# Patient Record
Sex: Male | Born: 1935 | Race: Asian | Hispanic: No | Marital: Married | Smoking: Never smoker
Health system: Southern US, Community
[De-identification: ages and names within clinical notes are randomized; demographics above are authoritative.]

## PROBLEM LIST (undated history)

## (undated) DIAGNOSIS — F32A Depression, unspecified: Secondary | ICD-10-CM

## (undated) DIAGNOSIS — K469 Unspecified abdominal hernia without obstruction or gangrene: Secondary | ICD-10-CM

## (undated) DIAGNOSIS — E785 Hyperlipidemia, unspecified: Secondary | ICD-10-CM

## (undated) DIAGNOSIS — Z951 Presence of aortocoronary bypass graft: Secondary | ICD-10-CM

## (undated) DIAGNOSIS — I340 Nonrheumatic mitral (valve) insufficiency: Secondary | ICD-10-CM

## (undated) DIAGNOSIS — E039 Hypothyroidism, unspecified: Secondary | ICD-10-CM

## (undated) DIAGNOSIS — F329 Major depressive disorder, single episode, unspecified: Secondary | ICD-10-CM

## (undated) DIAGNOSIS — I639 Cerebral infarction, unspecified: Secondary | ICD-10-CM

## (undated) DIAGNOSIS — R011 Cardiac murmur, unspecified: Secondary | ICD-10-CM

## (undated) DIAGNOSIS — J189 Pneumonia, unspecified organism: Secondary | ICD-10-CM

## (undated) DIAGNOSIS — I10 Essential (primary) hypertension: Secondary | ICD-10-CM

## (undated) DIAGNOSIS — Z87438 Personal history of other diseases of male genital organs: Secondary | ICD-10-CM

## (undated) DIAGNOSIS — E079 Disorder of thyroid, unspecified: Secondary | ICD-10-CM

## (undated) DIAGNOSIS — K219 Gastro-esophageal reflux disease without esophagitis: Secondary | ICD-10-CM

## (undated) DIAGNOSIS — G4730 Sleep apnea, unspecified: Secondary | ICD-10-CM

## (undated) HISTORY — DX: Cardiac murmur, unspecified: R01.1

## (undated) HISTORY — DX: Unspecified abdominal hernia without obstruction or gangrene: K46.9

## (undated) HISTORY — DX: Presence of aortocoronary bypass graft: Z95.1

## (undated) HISTORY — DX: Essential (primary) hypertension: I10

## (undated) HISTORY — DX: Cerebral infarction, unspecified: I63.9

## (undated) HISTORY — DX: Depression, unspecified: F32.A

## (undated) HISTORY — PX: CATARACT EXTRACTION: SUR2

## (undated) HISTORY — DX: Gastro-esophageal reflux disease without esophagitis: K21.9

## (undated) HISTORY — PX: TURP VAPORIZATION: SUR1397

## (undated) HISTORY — DX: Nonrheumatic mitral (valve) insufficiency: I34.0

## (undated) HISTORY — DX: Disorder of thyroid, unspecified: E07.9

## (undated) HISTORY — PX: CORONARY ARTERY BYPASS GRAFT: SHX141

## (undated) HISTORY — DX: Hyperlipidemia, unspecified: E78.5

## (undated) HISTORY — DX: Major depressive disorder, single episode, unspecified: F32.9

## (undated) HISTORY — PX: CHOLECYSTECTOMY: SHX55

## (undated) HISTORY — PX: OTHER SURGICAL HISTORY: SHX169

## (undated) HISTORY — PX: ERCP: SHX60

## (undated) HISTORY — DX: Personal history of other diseases of male genital organs: Z87.438

---

## 1999-08-17 ENCOUNTER — Ambulatory Visit (HOSPITAL_COMMUNITY): Admission: RE | Admit: 1999-08-17 | Discharge: 1999-08-17 | Payer: Self-pay | Admitting: Cardiology

## 1999-08-17 ENCOUNTER — Encounter: Payer: Self-pay | Admitting: Cardiology

## 1999-08-27 ENCOUNTER — Ambulatory Visit: Admission: RE | Admit: 1999-08-27 | Discharge: 1999-08-27 | Payer: Self-pay | Admitting: Cardiology

## 2008-06-14 HISTORY — PX: CORONARY ARTERY BYPASS GRAFT: SHX141

## 2008-06-14 HISTORY — PX: MITRAL VALVE REPAIR: SHX2039

## 2008-07-17 ENCOUNTER — Ambulatory Visit: Payer: Self-pay | Admitting: Cardiology

## 2008-08-01 ENCOUNTER — Ambulatory Visit: Payer: Self-pay | Admitting: Cardiology

## 2008-08-22 ENCOUNTER — Ambulatory Visit: Payer: Self-pay | Admitting: Cardiology

## 2008-08-29 ENCOUNTER — Ambulatory Visit (HOSPITAL_COMMUNITY): Admission: RE | Admit: 2008-08-29 | Discharge: 2008-08-29 | Payer: Self-pay | Admitting: Cardiology

## 2008-09-03 ENCOUNTER — Ambulatory Visit: Payer: Self-pay | Admitting: Cardiology

## 2008-09-03 LAB — CONVERTED CEMR LAB
BUN: 11 mg/dL (ref 6–23)
Basophils Absolute: 0 10*3/uL (ref 0.0–0.1)
Basophils Relative: 0 % (ref 0.0–3.0)
CO2: 30 meq/L (ref 19–32)
Calcium: 8.8 mg/dL (ref 8.4–10.5)
Chloride: 105 meq/L (ref 96–112)
Creatinine, Ser: 0.7 mg/dL (ref 0.4–1.5)
Eosinophils Absolute: 0.1 10*3/uL (ref 0.0–0.7)
Eosinophils Relative: 2 % (ref 0.0–5.0)
GFR calc non Af Amer: 118 mL/min (ref >60)
Glucose, Bld: 134 mg/dL — ABNORMAL HIGH (ref 70–99)
HCT: 37.3 % — ABNORMAL LOW (ref 39.0–52.0)
Hemoglobin: 12.7 g/dL — ABNORMAL LOW (ref 13.0–17.0)
INR: 1.1 — ABNORMAL HIGH (ref 0.8–1.0)
Lymphocytes Relative: 38.6 % (ref 12.0–46.0)
Lymphs Abs: 2.4 10*3/uL (ref 0.7–4.0)
MCHC: 34.1 g/dL (ref 30.0–36.0)
MCV: 85.2 fL (ref 78.0–100.0)
Monocytes Absolute: 0.4 10*3/uL (ref 0.1–1.0)
Monocytes Relative: 6.7 % (ref 3.0–12.0)
Neutro Abs: 3.4 10*3/uL (ref 1.4–7.7)
Neutrophils Relative %: 52.7 % (ref 43.0–77.0)
Platelets: 185 10*3/uL (ref 150.0–400.0)
Potassium: 4.3 meq/L (ref 3.5–5.1)
Prothrombin Time: 11.6 s (ref 10.9–13.3)
RBC: 4.38 M/uL (ref 4.22–5.81)
RDW: 12.3 % (ref 11.5–14.6)
Sodium: 141 meq/L (ref 135–145)
WBC: 6.3 10*3/uL (ref 4.5–10.5)
aPTT: 29 s — ABNORMAL HIGH (ref 21.7–28.8)

## 2008-09-06 ENCOUNTER — Ambulatory Visit: Payer: Self-pay | Admitting: Cardiovascular Disease

## 2008-09-06 ENCOUNTER — Inpatient Hospital Stay (HOSPITAL_BASED_OUTPATIENT_CLINIC_OR_DEPARTMENT_OTHER): Admission: RE | Admit: 2008-09-06 | Discharge: 2008-09-06 | Payer: Self-pay | Admitting: Cardiovascular Disease

## 2008-09-12 HISTORY — PX: MITRAL VALVE REPAIR: SHX2039

## 2008-09-25 ENCOUNTER — Ambulatory Visit: Payer: Self-pay | Admitting: Cardiology

## 2008-11-06 ENCOUNTER — Ambulatory Visit: Payer: Self-pay | Admitting: Cardiology

## 2008-11-07 ENCOUNTER — Encounter: Payer: Self-pay | Admitting: Cardiology

## 2008-11-20 ENCOUNTER — Ambulatory Visit: Payer: Self-pay | Admitting: Cardiology

## 2008-11-20 ENCOUNTER — Telehealth: Payer: Self-pay | Admitting: Cardiology

## 2008-11-20 LAB — CONVERTED CEMR LAB
Basophils Absolute: 0.1 10*3/uL (ref 0.0–0.1)
Bilirubin Urine: NEGATIVE
HCT: 34.5 % — ABNORMAL LOW (ref 39.0–52.0)
Hemoglobin: 12 g/dL — ABNORMAL LOW (ref 13.0–17.0)
Ketones, ur: NEGATIVE mg/dL
Leukocytes, UA: NEGATIVE
Lymphs Abs: 1.7 10*3/uL (ref 0.7–4.0)
MCV: 81.1 fL (ref 78.0–100.0)
Monocytes Relative: 5.1 % (ref 3.0–12.0)
Neutro Abs: 11 10*3/uL — ABNORMAL HIGH (ref 1.4–7.7)
RDW: 12.6 % (ref 11.5–14.6)
Total Protein, Urine: NEGATIVE mg/dL
pH: 6 (ref 5.0–8.0)

## 2008-11-22 ENCOUNTER — Ambulatory Visit: Payer: Self-pay | Admitting: Cardiology

## 2008-11-27 ENCOUNTER — Telehealth: Payer: Self-pay | Admitting: Cardiology

## 2008-12-05 ENCOUNTER — Encounter: Payer: Self-pay | Admitting: Cardiology

## 2008-12-05 ENCOUNTER — Encounter (HOSPITAL_COMMUNITY): Admission: RE | Admit: 2008-12-05 | Discharge: 2009-03-05 | Payer: Self-pay | Admitting: Cardiology

## 2009-03-05 ENCOUNTER — Encounter: Payer: Self-pay | Admitting: Cardiology

## 2009-03-06 ENCOUNTER — Encounter (HOSPITAL_COMMUNITY): Admission: RE | Admit: 2009-03-06 | Discharge: 2009-05-13 | Payer: Self-pay | Admitting: Cardiology

## 2009-03-14 ENCOUNTER — Encounter: Payer: Self-pay | Admitting: Cardiology

## 2009-03-14 DIAGNOSIS — I639 Cerebral infarction, unspecified: Secondary | ICD-10-CM

## 2009-03-14 HISTORY — DX: Cerebral infarction, unspecified: I63.9

## 2009-03-17 ENCOUNTER — Encounter: Payer: Self-pay | Admitting: Cardiology

## 2009-03-21 ENCOUNTER — Encounter: Payer: Self-pay | Admitting: Cardiology

## 2009-03-31 ENCOUNTER — Encounter: Payer: Self-pay | Admitting: Cardiology

## 2009-04-04 ENCOUNTER — Ambulatory Visit: Payer: Self-pay | Admitting: Vascular Surgery

## 2009-04-09 ENCOUNTER — Ambulatory Visit: Payer: Self-pay | Admitting: Vascular Surgery

## 2009-04-09 ENCOUNTER — Encounter: Admission: RE | Admit: 2009-04-09 | Discharge: 2009-04-09 | Payer: Self-pay | Admitting: Vascular Surgery

## 2009-04-14 HISTORY — PX: CAROTID ENDARTERECTOMY: SUR193

## 2009-04-16 ENCOUNTER — Encounter (INDEPENDENT_AMBULATORY_CARE_PROVIDER_SITE_OTHER): Payer: Self-pay | Admitting: *Deleted

## 2009-04-16 ENCOUNTER — Ambulatory Visit: Payer: Self-pay | Admitting: Cardiology

## 2009-04-16 DIAGNOSIS — I6350 Cerebral infarction due to unspecified occlusion or stenosis of unspecified cerebral artery: Secondary | ICD-10-CM | POA: Insufficient documentation

## 2009-04-16 DIAGNOSIS — I6529 Occlusion and stenosis of unspecified carotid artery: Secondary | ICD-10-CM | POA: Insufficient documentation

## 2009-04-16 DIAGNOSIS — F329 Major depressive disorder, single episode, unspecified: Secondary | ICD-10-CM

## 2009-04-16 DIAGNOSIS — R079 Chest pain, unspecified: Secondary | ICD-10-CM | POA: Insufficient documentation

## 2009-04-16 DIAGNOSIS — I059 Rheumatic mitral valve disease, unspecified: Secondary | ICD-10-CM | POA: Insufficient documentation

## 2009-04-16 DIAGNOSIS — I2510 Atherosclerotic heart disease of native coronary artery without angina pectoris: Secondary | ICD-10-CM | POA: Insufficient documentation

## 2009-04-16 DIAGNOSIS — F3289 Other specified depressive episodes: Secondary | ICD-10-CM | POA: Insufficient documentation

## 2009-04-22 ENCOUNTER — Ambulatory Visit: Payer: Self-pay | Admitting: Vascular Surgery

## 2009-04-22 ENCOUNTER — Inpatient Hospital Stay (HOSPITAL_COMMUNITY): Admission: RE | Admit: 2009-04-22 | Discharge: 2009-04-23 | Payer: Self-pay | Admitting: Vascular Surgery

## 2009-04-22 ENCOUNTER — Encounter: Payer: Self-pay | Admitting: Vascular Surgery

## 2009-04-23 ENCOUNTER — Encounter: Payer: Self-pay | Admitting: Cardiology

## 2009-04-23 ENCOUNTER — Encounter: Payer: Self-pay | Admitting: Vascular Surgery

## 2009-05-16 ENCOUNTER — Ambulatory Visit: Payer: Self-pay | Admitting: Vascular Surgery

## 2009-05-19 ENCOUNTER — Encounter (INDEPENDENT_AMBULATORY_CARE_PROVIDER_SITE_OTHER): Payer: Self-pay | Admitting: *Deleted

## 2009-05-28 ENCOUNTER — Encounter: Payer: Self-pay | Admitting: Cardiology

## 2009-06-02 ENCOUNTER — Encounter: Payer: Self-pay | Admitting: Cardiology

## 2009-06-09 ENCOUNTER — Ambulatory Visit: Payer: Self-pay | Admitting: Vascular Surgery

## 2009-06-17 ENCOUNTER — Ambulatory Visit: Payer: Self-pay | Admitting: Cardiology

## 2009-06-17 DIAGNOSIS — R5381 Other malaise: Secondary | ICD-10-CM | POA: Insufficient documentation

## 2009-06-17 DIAGNOSIS — R1312 Dysphagia, oropharyngeal phase: Secondary | ICD-10-CM | POA: Insufficient documentation

## 2009-06-17 DIAGNOSIS — R5383 Other fatigue: Secondary | ICD-10-CM

## 2009-06-30 ENCOUNTER — Encounter: Payer: Self-pay | Admitting: Cardiology

## 2009-09-04 ENCOUNTER — Encounter: Payer: Self-pay | Admitting: Cardiology

## 2009-10-07 ENCOUNTER — Encounter: Payer: Self-pay | Admitting: Cardiology

## 2009-10-08 ENCOUNTER — Encounter: Payer: Self-pay | Admitting: Cardiology

## 2009-10-21 ENCOUNTER — Encounter: Payer: Self-pay | Admitting: Cardiology

## 2009-10-30 ENCOUNTER — Ambulatory Visit: Payer: Self-pay | Admitting: Cardiology

## 2009-11-28 ENCOUNTER — Ambulatory Visit: Payer: Self-pay | Admitting: Vascular Surgery

## 2009-12-25 ENCOUNTER — Telehealth (INDEPENDENT_AMBULATORY_CARE_PROVIDER_SITE_OTHER): Payer: Self-pay | Admitting: *Deleted

## 2010-06-12 ENCOUNTER — Ambulatory Visit: Payer: Self-pay | Admitting: Vascular Surgery

## 2010-07-14 NOTE — Assessment & Plan Note (Signed)
Summary: 2 mo fu -srs   Visit Type:  Follow-up Primary Provider:  Sherril Croon  CC:  follow-up visit.  History of Present Illness: the patient is a 76 year old male with a history of mitral valve disease status post mitral valve repair at [facility] in [place].  The patient did well postoperatively but then experienced a stroke with hemiplegia which has resolved for the most part.  He underwent carotid endarterectomy of the right carotid artery.  Initially had difficulty with speech and required a speech therapist.  He states that after his stroke he never regained his energy back and feels sleepy all the time.  He reports some discomfort in the right side.  He also feels that he is choking when he started to eat or drink something.  There appears to be a lack of coordination of the swallowing mechanism.  At times feels that his breathing is interrupted requiring a sigh breath because of the inability to clear his saliva.  He did now denies however any recurrent chest pain, shortness of breath orthopnea or PND.  Preventive Screening-Counseling & Management  Alcohol-Tobacco     Smoking Status: never  Current Problems (verified): 1)  Chest Pain  (ICD-786.50) 2)  Depression  (ICD-311) 3)  Left Ventricular Function, Decreased  (ICD-429.2) 4)  Coronary Artery Bypass Graft, Hx of  (ICD-V45.81) 5)  Mitral Valve Disorder  (ICD-424.0) 6)  Carotid Artery Disease  (ICD-433.10) 7)  Cerebrovascular Accident  (ICD-434.91)  Current Medications (verified): 1)  Levothroid 50 Mcg Tabs (Levothyroxine Sodium) .... One Tablet Daily 2)  Metoprolol Tartrate 50 Mg Tabs (Metoprolol Tartrate) .... 1/2 Tablet By Mouth Twice A Day 3)  Aspir-Trin 325 Mg Tbec (Aspirin) .... Take 1 Tablet By Mouth Once A Day 4)  Vitamin B-12 500 Mcg Tabs (Cyanocobalamin) .... Take 1 Tablet By Mouth Once A Day 5)  Multivitamins  Tabs (Multiple Vitamin) .... Take 1 Tablet By Mouth Once A Day 6)  Crestor 10 Mg Tabs  (Rosuvastatin Calcium) .... Take 1 Tablet By Mouth Once A Day  Allergies (verified): No Known Drug Allergies  Past History:  Past Medical History: Last updated: 04/16/2009 CVA October 2010. 1. Acute pharyngitis and possibly bronchitis. 2. Myalgias and fever, likely secondary to acute pharyngitis and     possibly bronchitis. 3. Ruled out for endocarditis with negative blood cultures. 4. Status post mitral valve repair and single-vessel coronary artery     bypass grafting. 5. No residual mitral regurgitation. 6. Mild left ventricular dysfunction, ejection fraction of 49%. 7. History of hypertension, stable. 8. History of melanoma removal.   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 This shows a moderate 60% stenosis in his right internal carotid artery. I am concerned that he doesn't extremely irregular and builtup-like in this area. I have recommended right carotid endarterectomy for reduction of syndrome. I explained that with a moderate stenosis, active) symptoms and very irregular plaque I felt it was most her pretreatment. The procedure was described in great detail the potential risk for perioperative stroke at  1-2% risk for cranial nerve injury at 1-2% as well. The patient will proceed with surgery on 11 9 at [facility].  Review of Systems       The patient complains of fatigue.  The patient denies malaise, fever, weight gain/loss, vision loss, decreased hearing,  hoarseness, chest pain, palpitations, shortness of breath, prolonged cough, wheezing, sleep apnea, coughing up blood, abdominal pain, blood in stool, nausea, vomiting, diarrhea, heartburn, incontinence, blood in urine, muscle weakness, joint pain, leg swelling, rash, skin lesions, headache, fainting, dizziness, depression, anxiety, enlarged lymph nodes, easy bruising or bleeding, and environmental allergies.    Vital Signs:  Patient profile:   76 year old male Height:      66 inches Weight:      138 pounds Pulse rate:   50 / minute BP sitting:   129 / 63  (left arm) Cuff size:   regular  Vitals Entered By: Carlye Grippe (June 17, 2009 3:35 PM) CC: follow-up visit   Physical Exam  Additional Exam:  General: Well-developed, well-nourished in no distress head: Normocephalic and atraumatic eyes PERRLA/EOMI intact, conjunctiva and lids normal nose: No deformity or lesions mouth normal dentition, normal posterior pharynx neck: Supple, no JVD.  No masses, thyromegaly or abnormal cervical nodes lungs: Normal breath sounds bilaterally without wheezing.  Normal percussion heart: Rectal rate and rhythm with normal S1 and S2, no S3 or S4.  PMI is normal.  No pathological murmurs  abdomen: Normal bowel sounds, abdomen is soft and nontender without masses, organomegaly or hernias noted.  No hepatosplenomegaly musculoskeletal: Back normal, normal gait muscle strength and tone normal pulsus: Pulse is normal in all 4 extremities Extremities: No peripheral pitting edema neurologic: Alert and oriented x 3 skin: Intact without lesions or rashes cervical nodes: No significant adenopathy psychologic: Normal affect    Impression & Recommendations:  Problem # 1:  MITRAL VALVE DISORDER (ICD-424.0) the patient is status-post successful mitral valve repair.  He has no recurrent murmur. The following medications were removed from the medication list:    Lisinopril 10 Mg Tabs (Lisinopril) .Marland Kitchen... Take  1 tablet by mouth once a day His updated medication list for this problem includes:    Metoprolol Tartrate 50 Mg Tabs (Metoprolol tartrate) .Marland Kitchen... 1/2 tablet by mouth twice a day  Orders: T-TSH ([id number])  Problem # 2:  CORONARY ARTERY BYPASS GRAFT, HX OF (ICD-V45.81) patient underwent bypass grafting at the time of his mitral valve repair.  He has no recurrent chest pain.  There is no evidence of ischemia.  Problem # 3:  CEREBROVASCULAR ACCIDENT (ICD-434.91) the patient is status post right carotid endarterectomy.ithe reports no complications. His updated medication list for this problem includes:    Aspir-trin 325 Mg Tbec (Aspirin) .Marland Kitchen... Take 1 tablet by mouth once a day  Orders: T-TSH ([id number])  Problem # 4:  DYSPHAGIA OROPHARYNGEAL PHASE (GEX-528.41) we will refer to patient to Dr. Andrey Campanile ENT for evaluation of dysphagia and difficulty handling his saliva.  This is likely residual from his prior stroke.   Problem # 5:  FATIGUE (ICD-780.79) there is no clear hemodynamic reason why the patient's fatigue.  He does have hypothyroidism and we will check a TSH.  I also told him and his son to taper down his beta-blocker and ultimately to discontinue it.  It's possible beta-blockers contributed to his fatigue.  Patient Instructions: 1)  Stop beta blocker per instructions discussed with Dr. Andee Lineman. 2)  Labs:  TSH 3)  Referral to Dr. Andrey Campanile (  ENT) for dysphagia after stroke 4)  Follow up in 6 months.

## 2010-07-14 NOTE — Letter (Signed)
Summary: [facility]- OFFICE NOTE  E[facility]- OFFICE NOTE   Imported By: Claudette Laws 10/24/2009 08:35:13  _____________________________________________________________________  External Attachment:    Type:   Image     Comment:   External Document

## 2010-07-14 NOTE — Progress Notes (Signed)
Summary: V V OFFICE VISIT   V V OFFICE VISIT   Imported By: Zachary George 06/17/2009 13:57:50  _____________________________________________________________________  External Attachment:    Type:   Image     Comment:   External Document

## 2010-07-14 NOTE — Progress Notes (Signed)
Summary: V V OFFICE VISIT  V V OFFICE VISIT   Imported By: Zachary George 06/17/2009 14:01:06  _____________________________________________________________________  External Attachment:    Type:   Image     Comment:   External Document

## 2010-07-14 NOTE — Letter (Signed)
Summary: [place] D/C SUMMARY  [place] D/C SUMMARY   Imported By: Zachary George 06/17/2009 13:59:28  _____________________________________________________________________  External Attachment:    Type:   Image     Comment:   External Document

## 2010-07-14 NOTE — Letter (Signed)
Summary: [facility]-OFFICE NOTE  E[facility]-OFFICE NOTE   Imported By: Claudette Laws 10/24/2009 08:34:38  _____________________________________________________________________  External Attachment:    Type:   Image     Comment:   External Document

## 2010-07-14 NOTE — Letter (Signed)
Summary: TSH  TSH   Imported By: Cyril Loosen, RN, BSN 07/25/2009 16:38:02  _____________________________________________________________________  External Attachment:    Type:   Image     Comment:   External Document

## 2010-07-14 NOTE — Progress Notes (Signed)
Summary: e-mail sent to MD  Phone Note Other Incoming   Summary of Call: E-MAIL SENT TO DR. Andee Lineman PHONE:    Sent from my iPhone  Begin forwarded message:   From: [email or web address] Date: December 25, 2009 4:21:11 PM EDT To: Peyton [email or web address]> Subject: Thanks for recommending "UNSTUCK"       Thursday, December 25, 2009   Dear Dr Andee Lineman,   Dad asked me to email you.   He is grateful to you for everything you have done for him.  He is continuing to make small progress in a positive direction.  He is thoroughly enjoying the book "UNSTUCK".  He especially loves the chapter on spirituality.  The book's spiritual message embodies dad's lifelong philosophy.  He was so happy to read those principles written in such an important book.  The discussion on depression are also of great help to dad during his personal fight with depression.   Because of the book, dad has gotten emotionally better and stronger much faster.  Furthemorer, he continues to read passages in the book on a daily basis and continues to draw strength from it.   Each time he picks up the book, he thinks of you.  He always tells me that you have given him such great care and great advice.    Please accept our sincere gratitude for your love and care.  We are so lucky you are Dad's physician.     If there is anything we can ever do for you, please let us know.  It would be our pleasure to assist you in any way we can.   With Warmest Regards,   Tawni Pummel for Dad [facility] [place])

## 2010-07-14 NOTE — Medication Information (Signed)
Summary: Tax adviser   Imported By: Claudette Laws 10/24/2009 08:35:56  _____________________________________________________________________  External Attachment:    Type:   Image     Comment:   External Document

## 2010-07-14 NOTE — Assessment & Plan Note (Signed)
Summary: ROV-  WEAKNESS PAST MONTH   Visit Type:  weakness Primary Provider:  Vyas   History of Present Illness: the patient is a pleasant 76 year old male status-post mitral valve repair at [facility] in [place].  Initially did well postoperatively but then experienced a stroke in the plegia which has resolved for the most part.  He underwent carotid endarterectomy of the right carotid artery.  The patient main complaints now having lack of energy weakness and fatigue.  His symptoms have not improved over the last several months.  He feels tired all the time.  An extensive laboratory work showed no significant abnormality.  Several months ago in the last office visit I was concerned the patient was started to exhibit signs of depression.  He has now been started by his primary care physician on an antidepressant and is having clear signs of depression.  The patient is under a lot of social stress as it appears that his housemaid come under foreclosure.  He and his son present again today to make sure that there is no underlying cardiac issues.  The patient has no chest pain or shortness of breath.  His mitral valve repair appears to be durable.  He has no acute EKG changes.  Of note is that the patient had no vessel coronary bypass grafting at the time of his mitral valve surgery.   Preventive Screening-Counseling & Management  Alcohol-Tobacco     Smoking Status: never  Current Problems (verified): 1)  Fatigue  (ICD-780.79) 2)  Dysphagia Oropharyngeal Phase  (ICD-787.22) 3)  Chest Pain  (ICD-786.50) 4)  Depression  (ICD-311) 5)  Left Ventricular Function, Decreased  (ICD-429.2) 6)  Coronary Artery Bypass Graft, Hx of  (ICD-V45.81) 7)  Mitral Valve Disorder  (ICD-424.0) 8)  Carotid Artery Disease  (ICD-433.10) 9)  Cerebrovascular Accident  (ICD-434.91)  Current Medications (verified): 1)  Levothroid 50 Mcg Tabs (Levothyroxine Sodium) .... One Tablet Daily 2)  Aspir-Trin  325 Mg Tbec (Aspirin) .... Take 1 Tablet By Mouth Once A Day 3)  Vitamin B-12 500 Mcg Tabs (Cyanocobalamin) .... Take 1 Tablet By Mouth Once A Day 4)  Multivitamins  Tabs (Multiple Vitamin) .... Take 1 Tablet By Mouth Once A Day 5)  Fluoxetine Hcl 20 Mg Caps (Fluoxetine Hcl) .... Take 1 Tablet By Mouth Once A Day  Allergies (verified): No Known Drug Allergies  Comments:  Nurse/Medical Assistant: The patient's medications and allergies were reviewed with the patient and were updated in the Medication and Allergy Lists.  List reviewed.  Past History:  Past Medical History: Last updated: 04/16/2009 CVA October 2010. 1. Acute pharyngitis and possibly bronchitis. 2. Myalgias and fever, likely secondary to acute pharyngitis and     possibly bronchitis. 3. Ruled out for endocarditis with negative blood cultures. 4. Status post mitral valve repair and single-vessel coronary artery     bypass grafting. 5. No residual mitral regurgitation. 6. Mild left ventricular dysfunction, ejection fraction of 49%. 7. History of hypertension, stable. 8. History of melanoma removal.   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  Alcohol Use - no  Risk Factors:  Smoking Status: never (10/30/2009)  Past Cardiac History:  [facility] Hospitalizations: 04/09/2009:  Stroke/CVA (VVTS consultation) CT Scan reviewed and discussed at length with the patient and his son. This shows a moderate 60% stenosis in his right internal carotid artery. I am concerned that he doesn't extremely irregular and builtup-like in  this area. I have recommended right carotid endarterectomy for reduction of syndrome. I explained that with a moderate stenosis, active) symptoms and very irregular plaque I felt it was most her pretreatment. The procedure was described in great detail the potential risk for perioperative stroke at 1-2% risk for cranial nerve injury at 1-2% as well. The patient will proceed with surgery on 11 9 at [facility].  Review of Systems       The patient complains of fatigue, shortness of breath, and depression.  The patient denies malaise, fever, weight gain/loss, vision loss, decreased hearing, hoarseness, chest pain, palpitations, prolonged cough, wheezing, sleep apnea, coughing up blood, abdominal pain, blood in stool, nausea, vomiting, diarrhea, heartburn, incontinence, blood in urine, muscle weakness, joint pain, leg swelling, rash, skin lesions, headache, fainting, dizziness, anxiety, enlarged lymph nodes, easy bruising or bleeding, and environmental allergies.    Vital Signs:  Patient profile:   76 year old male Height:      66 inches Weight:      137 pounds O2 Sat:      99 % Pulse rate:   65 / minute BP sitting:   136 / 72  (left arm) Cuff size:   regular  Vitals Entered By: Carlye Grippe (Oct 30, 2009 3:35 PM) Comments fatigue   Physical Exam  Additional Exam:  General: Well-developed, well-nourished in no distress head: Normocephalic and atraumatic eyes PERRLA/EOMI intact, conjunctiva and lids normal nose: No deformity or lesions mouth normal dentition, normal posterior pharynx neck: Supple, no JVD.  No masses, thyromegaly or abnormal cervical nodes lungs: Normal breath sounds bilaterally without wheezing.  Normal percussion heart: Rectal rate and rhythm with normal S1 and S2, no S3 or S4.  PMI is normal.  No pathological murmurs  abdomen: Normal bowel sounds, abdomen is soft and nontender without masses, organomegaly or hernias noted.  No  hepatosplenomegaly musculoskeletal: Back normal, normal gait muscle strength and tone normal pulsus: Pulse is normal in all 4 extremities Extremities: No peripheral pitting edema neurologic: Alert and oriented x 3 skin: Intact without lesions or rashes cervical nodes: No significant adenopathy psychologic: Normal affect    EKG  Procedure date:  10/30/2009  Findings:      normal sinus rhythm.  Right bundle branch block.  Inferior infarct pattern.  Heart rate 60 beats/min  Impression & Recommendations:  Problem # 1:  FATIGUE (ICD-780.79) there is no clear definite cardiac cause for the patient's fatigue.  I suspect this is related to his underlying depression.  Problem # 2:  LEFT VENTRICULAR FUNCTION, DECREASED (ICD-429.2) the patient has single-vessel coronary bypass grafting.  His ejection fraction was mildly decreased at 49%.  We will obtain an echocardiogram in 3 months.EKG also shows a new right bundle branch block.  There are inferior Q waves but he appeared to be old.  I did not discuss this with the patient and his family in the office and I reviewed this afterwards.  We will have the patient obtain an echocardiogram earlier and if his ejection fraction is furtherb decreased he will need a stress test.  Problem # 3:  CORONARY ARTERY BYPASS GRAFT, HX OF (ICD-V45.81) Assessment: Comment Only  Orders:  EKG w/ Interpretation (93000)  Problem # 4:  CAROTID ARTERY DISEASE (ICD-433.10) the patient has recovered well from his CVA.  Status post uncomplicated carotid endarterectomy.  Continuous factor modifications His updated medication list for this problem includes:    Aspir-trin 325 Mg Tbec (Aspirin) .Marland Kitchen... Take 1 tablet by mouth once a day  Problem # 5:  MITRAL VALVE DISORDER (ICD-424.0) the patient status-post mitral valve repair physical examinationshows no evidence of mitral valve dysfunction. The following medications were removed from the medication list:    Metoprolol  Tartrate 50 Mg Tabs (Metoprolol tartrate) .Marland Kitchen... 1/2 tablet by mouth twice a day  Patient Instructions: 1)  Your physician recommends that you continue on your current medications as directed. Please refer to the Current Medication list given to you today. 2)  Your physician wants you to follow-up in: 6 months. You will receive a reminder letter in the mail about two months in advance. If you don't receive a letter, please call our office to schedule the follow-up appointment.

## 2010-07-14 NOTE — Miscellaneous (Signed)
Summary: Orders Update - ENT REFERRAL  Clinical Lists Changes  Orders: Added new Referral order of ENT Referral (ENT) - Signed

## 2010-07-14 NOTE — Miscellaneous (Signed)
Summary: Rehab Report/ [facility]  Rehab Report/ [facility]   Imported By: Dorise Hiss 09/04/2009 14:09:06  _____________________________________________________________________  External Attachment:    Type:   Image     Comment:   External Document

## 2010-07-14 NOTE — Op Note (Signed)
Summary: Operative Report  Operative Report   Imported By: Zachary George 06/17/2009 13:59:00  _____________________________________________________________________  External Attachment:    Type:   Image     Comment:   External Document

## 2010-09-16 LAB — BASIC METABOLIC PANEL
Calcium: 8.7 mg/dL (ref 8.4–10.5)
Creatinine, Ser: 0.86 mg/dL (ref 0.4–1.5)
GFR calc non Af Amer: >60 mL/min (ref >60)
Glucose, Bld: 130 mg/dL — ABNORMAL HIGH (ref 70–99)
Sodium: 136 mEq/L (ref 135–145)

## 2010-09-16 LAB — TYPE AND SCREEN: ABO/RH(D): B POS

## 2010-09-16 LAB — URINALYSIS, ROUTINE W REFLEX MICROSCOPIC
Bilirubin Urine: NEGATIVE
Glucose, UA: NEGATIVE mg/dL
Hgb urine dipstick: NEGATIVE
Protein, ur: NEGATIVE mg/dL

## 2010-09-16 LAB — ABO/RH: ABO/RH(D): B POS

## 2010-09-16 LAB — CBC
Hemoglobin: 11.7 g/dL — ABNORMAL LOW (ref 13.0–17.0)
MCHC: 34.3 g/dL (ref 30.0–36.0)
Platelets: 131 10*3/uL — ABNORMAL LOW (ref 150–400)
RDW: 13.2 % (ref 11.5–15.5)

## 2010-09-24 LAB — POCT I-STAT 3, VENOUS BLOOD GAS (G3P V)
pCO2, Ven: 46.2 mmHg (ref 45.0–50.0)
pH, Ven: 7.301 — ABNORMAL HIGH (ref 7.250–7.300)
pO2, Ven: 37 mmHg (ref 30.0–45.0)

## 2010-09-24 LAB — POCT I-STAT 3, ART BLOOD GAS (G3+)
Bicarbonate: 24.2 mEq/L — ABNORMAL HIGH (ref 20.0–24.0)
TCO2: 26 mmol/L (ref 0–100)
pCO2 arterial: 44.2 mmHg (ref 35.0–45.0)
pH, Arterial: 7.347 — ABNORMAL LOW (ref 7.350–7.450)
pO2, Arterial: 82 mmHg (ref 80.0–100.0)

## 2010-10-27 NOTE — Assessment & Plan Note (Signed)
[name]                          [facility] OFFICE NOTE   NAME:[name], [name]                     MRN:          [id number]  DATE:11/06/2008                            DOB:          [birth year]    REFERRING PHYSICIAN:  Doreen Beam, MD   HISTORY OF PRESENT ILLNESS:  The patient is a very pleasant 76 year old  male who is 33-month status post mitral valve repair, a single-vessel  coronary artery bypass grafting by Dr. Villa Herb at [name].  The patient has done very well postoperatively.  He denies  any shortness of breath or chest pain.  He does have a mild cough, which  improved after he brings up some mucus.  He has had no fever or chills,  however.  The patient did report several days after his discharge from  [name], an episode of micturition syncope.  He actually took his  blood pressure and heart rate and there were both quite low.  This was  coming off the plane when he felt he had a full bladder with urgency and  could not get to the bathroom fast enough.  However in the interim, he  has had no further problems.  He is not on Lasix anymore at the present  time.  A chest x-ray was obtained postoperatively and it appears normal.  His EKG also demonstrates normal sinus rhythm with small Q-waves in  inferior leads, but unchanged from his prior tracing.   MEDICATIONS:  1. Levothyroxine 50 mcg.  2. Crestor 10 mg p.o. daily.  3. Vitamin B12 mg 500 mcg p.o. daily.  4. Multivitamin.  5. Aspirin 325 daily.  6. Metoprolol 25 mg p.o. daily   PHYSICAL EXAMINATION:  VITAL SIGNS:  Blood pressure 113/72, heart rate  92, and weight 130 pounds.  GENERAL:  Well nourished [place] male, but in no apparent distress.  HEENT:  Pupils; eyes are clear.  Conjunctivae clear.  NECK:  Supple.  Normal carotid upstroke.  No carotid bruits.  LUNGS:  Clear breath sounds bilaterally.  HEART:  Regular rate and rhythm with normal S1 and S2.  No  murmur, rubs,  or gallops.  ABDOMEN:  Soft and nontender.  EXTREMITIES:  No cyanosis, clubbing, or edema.  NEUROLOGIC:  The patient is alert, oriented and grossly nonfocal.   PROBLEM LIST:  1. Status post mitral valve repair and single-vessel coronary artery      bypass grafting.  2. No residual mitral regurgitation.  3. Mild left ventricular dysfunction, ejection fraction 49%.  4. Hypertension, stable.  5. History of melanoma removal.   PLAN:  1. We reviewed the patient's postoperative chest x-ray, is within      normal limits.  2. The patient appears to be somewhat dehydrated and therefore I      suspected elevated heart rate.  We asked him to liberalize his salt      intake and that take some additional Gatorade.  We will also await      a week before starting him on an ACE inhibitor for his left  ventricular dysfunction.  3. In about 3 months, we will obtain a followup of echocardiogram      mainly to reassess his left ventricular function.  There is no      residual mitral regurgitation postoperatively.  The patient will      also be enrolled in cardiac rehab.     Learta Codding, MD,FACC  Electronically Signed    GED/MedQ  DD: 11/06/2008  DT: 11/07/2008  Job #: [id number]   cc:   Doreen Beam, MD  Marita Kansas, MD

## 2010-10-27 NOTE — Assessment & Plan Note (Signed)
[facility]                          [facility] OFFICE NOTE   NAME:[name], [name]                     MRN:          [id number]  DATE:08/01/2008                            DOB:          1934/09/29    HISTORY OF PRESENT ILLNESS:  The patient is a very pleasant 76 year old  [place] male who was recently admitted with abdominal pain.  The patient  appeared to have acute cholecystitis with gallstones and a biliary  obstruction due to common bile duct stone.  First an ERCP was done.  It  was noted that he had a pyloric channel that required dilatation also  the patient had a stone in distal and the common bile duct which could  not be cannulated.  Sphincterotomy was done and stent was placed by Dr.  Loreta Ave.  Initially laparoscopic cholecystectomy was done, but the patient  continued to have pain and nausea and therefore they had to do the ERCP  because of a retained common bile duct stone.   The patient, however, recovered well.  During this hospitalization he  was found to have a significant murmur and also was noted to have mitral  valve prolapse with mitral regurgitation first diagnosed in 1981 at [facility].  The patient does not have a history of rheumatic fever.  The  echo showed mitral valve prolapse with an ejection fraction 40% to 45%  and moderate-to-severe mitral regurgitation.  Now the patient has seen  me in consultation, but interestingly he appears to be NYHA class 1,  although I think this is unlikely.  We will proceed first with a TE and  then I think this is to be followed by an exercise treadmill test,  although there are certain already criteria for surgery because of the  decreased ejection fraction.   ALLERGIES:  No known drug allergies.   PAST MEDICAL HISTORY:  1. Thyroid disease.  2. Urinary problems.  3. Laparoscopic cholecystectomy.  4. Cataract surgery.  5. Enlarged prostate.  6. Melanoma removal.  7. History of  typhoid fever.   FAMILY HISTORY:  Father died from unknown causes.  Mother died from lung  failure.   SOCIAL HISTORY:  The patient denies any tobacco use.  He lives in the  [place].  He still works and owns his own business.  He is an  Airline pilot.   REVIEW OF SYSTEMS:  As per HPI.  No nausea or vomiting, no fever or  chills.  No melena, hematochezia, no dysuria or frequency.  No  palpitation or syncope.  Occasional constipation when traveling.  Remainder of review of systems otherwise within normal limits.   PHYSICAL EXAMINATION:  VITAL SIGNS:  Blood pressure 147/66, heart rate  is 67, weight is 137 pounds.  GENERAL:  Well-nourished, [place] male in no distress.  HEENT:  Pupils are clear.  Conjunctivae clear.  NECK:  Supple, normal carotid upstroke, and no carotid bruits.  No  thyromegaly.  No nodular thyroid.  JVD is 5 cm.  LUNGS:  Clear breath sounds bilaterally.  HEART:  Regular rate and rhythm with normal S1, S2.  There  is a 4/6  holosystolic murmur at the left sternal border with radiation into the  left flank.  ABDOMEN:  Soft and nontender.  No rebound or guarding.  Good bowel  sounds.  EXTREMITIES:  No cyanosis, clubbing, or edema.  NEUROLOGIC:  The patient is alert, oriented, grossly nonfocal.  No  definite adenopathy.  SKIN:  Without lesions.   PROBLEM LIST:  1. Mitral valve prolapse with severe mitral regurgitation.      a.     Ejection fraction 40% to 50%.      b.     New York Heart Association class 1.  2. Status post cholecystectomy.  3. Status post endoscopic retrograde cholangiopancreatography      secondary to retained stone.  4. History of melanoma removal.  5. History of typhoid fever.   PLAN:  1. We will proceed with TE to quantify the patient's mitral      regurgitation and define the exact anatomy of the mitral valve.  2. If the patient has severe mitral regurgitation based on his      decreased ejection fraction, he will need mitral valve surgery.   He      will also need a catheterization first and the patient is      contemplating to go to a Dr. Janey Genta in [place].  3. The patient has been issued a card for endocarditis prophylaxis.  4. The patient will follow up with me on August 06, 2008, when he      plans to do his TEE.     Learta Codding, MD,FACC  Electronically Signed    GED/MedQ  DD: 08/01/2008  DT: 08/02/2008  Job #: [id number]   cc:   Doreen Beam, MD

## 2010-10-27 NOTE — Assessment & Plan Note (Signed)
[facility]                          [facility]ICE NOTE   NAME:Toto, [facility]                     MRN:          [id number]  DATE:11/22/2008                            DOB:          Nov 26, 1934    REFERRING PHYSICIAN:  Doreen Beam, MD   HISTORY OF PRESENT ILLNESS:  The patient is a pleasant 76 year old male  friend of Dr. Sherril Croon with status post mitral valve repair and single-  vessel coronary artery bypass grafting by Dr. Pernell Dupre at [facility].  The patient was seen in postoperative followup on Nov 06, 2008, when he was doing quite well.  However, the patient had called me  because of the last several days he had felt body aches, sore throat,  and couple of days of fever up to 102.  He also reported some difficulty  swallowing.  He has complained of generalized fatigue and weakness, and  also has a cough, which was productive of yellow phlegm.  He denies,  however, any substernal chest pain.  He also denies any type of  pleuritic pain.  Blood cultures were drawn earlier when the patient  called and they have been negative so far.  He did have an elevated  white count.  His UA was negative for urinary tract infection.  The  patient first 2 days took Tylenol without much improvement in his fever  and he switched to ibuprofen.  He became afebrile.   MEDICATIONS:  1. Levothyroxine 50 mcg p.o. daily.  2. Crestor 10 mg p.o. daily.  3. Vitamin B12 500 mcg p.o. daily.  4. Multivitamin 1 tablet p.o. daily.  5. Aspirin 325 daily.  6. Metoprolol tartrate 25 mg p.o. b.i.d.  7. Lisinopril 10 mg p.o. daily.   PHYSICAL EXAMINATION:  VITAL SIGNS:  Blood pressure 125/71, heart rate  75, temperature 98, weight is 129.8 pounds, unchanged.  GENERAL:  Well-nourished [place] male with in no apparent distress.  HEENT:  Pupils, eyes are clear.  Conjunctivae clear.  NECK:  Supple.  Normal carotid upstroke.  No carotid bruits.  MOUTH:  The patient had  inflamed posterior pharynx with a strawberry  appearance and some white streaks.  LUNGS:  Scattered rhonchi, but no definite wheezing or crackles.  There  was adenopathy in the right cervical area.  HEART:  Regular rate and rhythm.  Normal S1 and S2.  No murmur, rubs, or  gallops.  ABDOMEN:  Soft, nontender.  No rebound or guarding.  Good bowel sounds.  EXTREMITIES:  No cyanosis, clubbing, or edema.  NEUROLOGIC:  The patient is alert and oriented.  Grossly nonfocal.   PROBLEM LIST:  1. Acute pharyngitis and possibly bronchitis.  2. Myalgias and fever, likely secondary to acute pharyngitis and      possibly bronchitis.  3. Ruled out for endocarditis with negative blood cultures.  4. Status post mitral valve repair and single-vessel coronary artery      bypass grafting.  5. No residual mitral regurgitation.  6. Mild left ventricular dysfunction, ejection fraction of 49%.  7. History of hypertension, stable.  8. History of melanoma removal.  PLAN:  1. I suspect the patient's fever and elevated white count is secondary      to pharyngitis.  This could well be streptococcal, but we were      unable to obtain cultures in our office.  I did put the patient on      Augmentin 875 mg p.o. b.i.d.  2. I asked the patient to e-mail me in the next couple of days to make      sure that he has improvement or to come back to see [place] or to the      emergency room, if he has recurrent fevers.  3. It does not appear that there is any complications related to the      patient's mitral valve repair.  There is no evidence of      endocarditis.  4. The patient can follow up with [place] in the next couple of weeks.     Learta Codding, MD,FACC  Electronically Signed    GED/MedQ  DD: 11/24/2008  DT: 11/25/2008  Job #: [id number]   cc:   Doreen Beam, MD

## 2010-10-27 NOTE — Assessment & Plan Note (Signed)
OFFICE VISIT   [name], [name]  DOB:  1935-03-13                                       06/09/2009  GNFAO#:[id number]   Patient presents today for followup of his right carotid endarterectomy  on 04/22/09.  He had a Vicryl stitch at the upper pole of his incision.  This was removed without difficulty.  He has no evidence of erythema or  wound problems.   He will continue his usual activity.  I plan to see him again in 6  months for repeat carotid duplex.     Larina Earthly, M.D.  Electronically Signed   TFE/MEDQ  D:  06/09/2009  T:  06/10/2009  Job:  [id number]

## 2010-10-27 NOTE — Cardiovascular Report (Signed)
NAMEGUILLERMO, NEHRING            ACCOUNT NO.:  [id number]   MEDICAL RECORD NO.:  [id number]          PATIENT TYPE:  OIB   LOCATION:  1961                         FACILITY:  [facility]   PHYSICIAN:  Verne Carrow, MDDATE OF BIRTH:  [birth year]   DATE OF PROCEDURE:  09/06/2008  DATE OF DISCHARGE:  09/06/2008                            CARDIAC CATHETERIZATION   PRIMARY CARDIOLOGIST:  Learta Codding, MD,FACC   PROCEDURES PERFORMED:  1. Left heart catheterization.  2. Selective coronary angiography.  3. Right heart catheterization.  4. Left ventricular angiography.   OPERATOR:  Verne Carrow, MD   INDICATIONS:  This is a 76 year old [place] male with a history of  thyroid disease, enlarged prostate, and prior melanoma who was recently  hospitalized with abdominal pain and found to have gallstones.  During  the hospitalization, he was found to have a significant murmur and was  noted to have mitral valve prolapse with mitral regurgitation and mildly  reduced ejection fraction of 40-45%.  The mitral regurgitation was felt  to be moderately severe at that time.  Subsequent transesophageal  echocardiogram was performed and the mitral regurgitation was felt to be  severe.  Cardiac MRI was then performed and this showed an enlarged left  atrium, normal left ventricular systolic function with an ejection  fraction of 57% and a mitral regurgitant fraction of 39%.  Based on  these findings, the mitral regurgitation was felt to be moderate in  severity.  The patient was referred today for a left and right heart  catheterization to further define the mitral regurgitation as well as to  assess his pulmonary pressures and to exclude obstructive coronary  artery disease.   PROCEDURE IN DETAIL:  The patient was brought into the outpatient  cardiac catheterization laboratory after signing informed consent for  the procedure.  The right groin was prepped and draped in sterile  fashion.   Lidocaine 1% was used for local anesthesia.  A 4-French sheath  was inserted into the right femoral artery without difficulty.  A JL-5  catheter was used to selectively engage the left main coronary artery.  Selective angiography was then performed.  A 3D RC catheter was used to  selectively engage the right coronary artery and was followed by  selective angiography.  A 4-French pigtail catheter was used  to cross  the aortic valve into the left ventricle.  Following the performance of  the left ventricular angiogram, the catheter was pulled back across the  aortic valve with no significant gradient measured.  During the left  ventricular angiogram, the patient was noted to have moderately severe  mitral regurgitation.  The left atrial size was noted to be enlarged.  There is no evidence of aortic stenosis. A Swan-Ganz catheter was used  for right heart catheterization.   HEMODYNAMIC FINDINGS:  Central aortic pressure 148/65.  Left ventricular  pressure 137/9 with left ventricular end-diastolic pressure of 13.  Right atrial pressure 8/4, right ventricular pressure 27/1, pulmonary  artery pressure 23/6 with a mean of 14, pulmonary capillary wedge  pressure 10, aortic saturation 95%, pulmonary artery saturation 64%,  cardiac index 4  liters per minute (by Fick method), cardiac index 2.3  L/min/sq m.   ANGIOGRAPHIC FINDINGS:  1. The left main coronary artery is a short segment and has mild      plaque disease.  This bifurcates into the circumflex and the LAD.  2. The left anterior descending itself is a large vessel that courses      to the apex and gives off a very small early diagonal branch, a      moderate-sized second diagonal branch, and a small third diagonal      branch.  There is a long tubular stenosis beginning at the ostium      of the LAD that extends down to the distal portion of the proximal      segment at the bifurcation into the second diagonal branch.  The      stenosis  near the ostium of the LAD is 50-60% and becomes more      severe just prior to the takeoff of the diagonal branch.  The      stenosis prior to the takeoff of diagonal branch in the proximal      LAD appears to be 80%.  The diagonal branch itself has 30-40%      plaque at the ostium.  The mid and distal LAD have mild luminal      irregularities, but no other obstructive lesions.  3. The circumflex artery has plaque in the proximal portion and a 30-      40% stenosis in the midportion.  The first obtuse marginal is very      small in caliber.  Second obtuse marginal is a moderate-sized      vessel with no obstructive disease.  There is a posterolateral      branch that has mild plaque disease.  4. The right coronary artery is a dominant vessel that has an ostial      lesion of 20-30%.  There are no other significant lesions.  The      distal right bifurcates into a posterolateral branch and a      posterior descending artery.  5. Left ventricular angiogram was performed in the RAO projection,      which shows low normal left ventricular systolic function with an      ejection fraction of 50%.  There is moderately severe mitral      regurgitation.  There is no evidence of aortic stenosis.  The left      atrial cavity size appears enlarged.   IMPRESSION:  1. Single-vessel coronary artery disease.  2. Low normal left ventricular systolic function.  3. Moderate-to-severe mitral regurgitation.  4. No evidence of aortic stenosis.  5. Enlarged left atrium.  6. Normal pulmonary artery pressure.   RECOMMENDATIONS:  I feel that the patient will most likely need surgical  correction of his mitral valve abnormality.  He will also need to have  revascularization of the left anterior descending coronary artery.  This  will most likely be performed at the same time of the mitral valve  surgery with a left internal mammary artery graft tied into the mid LAD.  I will defer further management to Dr.  Andee Lineman.  I have spoken on the  phone with Dr. Andee Lineman about the case today.  The patient will follow up  with Dr. Andee Lineman in his office next week for further planning for  surgical correction.      Verne Carrow, MD  Electronically Signed  CM/MEDQ  D:  09/06/2008  T:  09/07/2008  Job:  [id number]   cc:   Learta Codding, MD,FACC

## 2010-10-27 NOTE — Assessment & Plan Note (Signed)
[facility]                          [facility] NOTE   NAME:[name], [name]                     MRN:          [id number]  DATE:09/25/2008                            DOB:          21-Feb-1935    REFERRING PHYSICIAN:  Doreen Beam, MD   PRIMARY CARE PHYSICIAN:  Doreen Beam, MD   HISTORY OF PRESENT ILLNESS:  Please refer to my prior notes regarding  this patient.  In essence, this patient has possibly severe mitral  regurgitation with an ejection fraction of 40%-45%.  He underwent  cardiac MRI, cardiac catheterization, echocardiogram, and  transesophageal echocardiogram to document the severity of mitral  regurgitation.  There was some question about the left atrial size, but  by MRI was 57 cm.  The patient is also NYHA class II with depressed  ejection fraction.  I feel very strongly that he needs to proceed with  surgery.  The patient states that [place], and therefore, I have referred him to Dr. Villa Herb at [facility].  I did call Dr. Pernell Dupre' assistant and made an appointment  and the patient will be notified in the next couple of days.  Also, make  sure all the data are burned on CDs for Dr. Pernell Dupre' review.   Today, the patient is slightly hypertensive.  He reports some cramps and  muscle aches with Crestor, would like to be switched to Lipitor.  He  also still has some loss of taste of foods ever since his ERCP.   MEDICATIONS:  1. Levothyroxine 50 mcg p.o. daily.  2. Crestor 10 mg p.o. daily.  3. Vitamin B12.  4. Aspirin 81 mg daily.  5. Multivitamin.   PHYSICAL EXAMINATION:  VITAL SIGNS:  Blood pressure 169/65, heart rate  __________, weight 137 pounds.  NECK:  Normal carotid upstroke.  No carotid bruits.  LUNGS:  Clear breath sounds bilaterally.  HEART:  Regular rate and rhythm with normal S1 and S2.  There is a 4/6  holosystolic murmur at the apex radiating to the axilla.  PMI is  displaced.  ABDOMEN:  Soft, nontender.  No rebound or guarding.  Good bowel sounds.  EXTREMITIES:  No cyanosis, clubbing, or edema.   PROBLEMS:  1. Moderately severe to severe mitral regurgitation.  2. Mitral valve prolapse/Barlow syndrome.  3. Left ventricular dysfunction.  4. Hypertension, poorly controlled.  5. History of melanoma removal.  6. Single-vessel coronary artery disease with 80% LAD disease.   PLAN:  1. The patient has been referred to Dr. Villa Herb for mitral valve      repair and single-vessel bypass grafting.  2. I have given the patient information about Dr. Pernell Dupre and I have      told the patient that if he does not hear from their office in the      next couple of days, we will call him again particularly as his son      needs to go back to [place] before October 11, 2008.     Learta Codding, MD,FACC  Electronically Signed  GED/MedQ  DD: 09/25/2008  DT: 09/26/2008  Job #: [id number]   cc:   Doreen Beam, MD

## 2010-10-27 NOTE — Assessment & Plan Note (Signed)
OFFICE VISIT   CHANTRY, HEADEN  DOB:  1934/10/25                                       05/16/2009  XLKGM#:[id number]   Patient presents today for follow-up of his right carotid endarterectomy  on 04/22/09.  He has done well since discharge from the hospital.  He  does have 1 or 2 episodes since discharge of a sensation of hanging up  with swallowing.  This is uncommon, and I explained that this should  continue to resolve without active treatment.   He does have some peri-incisional numbness.  I explained that this is  always the case and will resolve in the 3-38-month time period.  He does  report some stiffness in his right arm and has some inability to lift  this over his head.  It does not appear to be a nerve issue.  I suspect  he does have some primary shoulder issue.  He will continue to watch  this as well.   His neck incision is healing quite nicely.  He has no focal neurologic  deficits.  I am quite pleased with his progress, plan to see him again  in 6 months with repeat carotid duplex.  He was given a prescription for  release to return to driving.   Larina Earthly, M.D.  Electronically Signed   TFE/MEDQ  D:  05/16/2009  T:  05/17/2009  Job:  [id number]

## 2010-10-27 NOTE — Assessment & Plan Note (Signed)
[facility]                          [facility]   NAME:Leonhart, [facility]                     MRN:          [id number]  DATE:09/05/2008                            DOB:          1934-08-01    REFERRING PHYSICIAN:  Doreen Beam, MD   REASON FOR DICTATION:  Referral for catheterization.   HISTORY OF PRESENT ILLNESS:  The patient is a very pleasant 76 year old  [place] male who was admitted several months ago with abdominal pain.  The patient had acute cholecystitis and first ERCP was done.  He has  subsequently required sphincterotomy and a stent was placed by Dr. Loreta Ave.  Initially, laparoscopic cholecystectomy was done, but patient continued  to have pain and nausea and therefore have to do the ERCP because of a  retained common bile duct stone.  The patient has recovered well and  during the hospitalization morning, he was found to have significant  murmur.  He was also known to have mitral valve prolapse and mitral  regurgitation first diagnosed in 1981 at [facility].  The patient does  not have a history of rheumatic fever.  The echo during this  hospitalization showed mitral valve prolapse with an ejection fraction  of 40-45%, moderate-to-severe mitral regurgitation.  I saw the patient  in consultation on August 01, 2008.  My initial impression was that he  was in NYHA class I, but discussing this further with him, he appears to  be more likely an NYHA class II.  At that point, we decided to proceed  with a TEE, which demonstrated an ejection of 45-50%.  There was  myxomatous mitral valve with Barlow syndrome with extensive bowing and  possibly partial chordal tear and some degree of flail of A2.  There was  definite prolapse of the A2 segment of the anterior mitral valve  leaflet.  I felt that the mitral regurgitation jet was severe.  However,  there was no pulmonary vein flow reversal in 2 pulmonary veins, but in  the right upper  pulmonary vein, there was blunting of the pulmonary vein  flow.  The jet was also eccentric and posteriorly directed.  As the  patient's symptoms were somewhat out of proportion to the degree of his  mitral regurgitation and before recommending valve surgery, I want to  make sure that the patient indeed had severe mitral regurgitation.  I  referred the patient to Dr. Marca Ancona for an MRI.  By TEE, it  appeared that the left atrium was not particularly enlarged as well as  by transthoracic echo.  Therefore, I contacted Dr. Marca Ancona and  want to make sure that the MR was truly severe.  Dr. Freida Busman called me  and was felt that the MR was more in the moderate range within a  divergent fraction of 39% and a regurgitant volume of 37 mL.  There was  no myocardial related hence we suggesting evidence of myocardial  infarction or infiltrative disease.  Although, it was initially told of  the left atrium was not particularly large.  The dimensions on the  report demonstrates 5.7  cm.  He also reported that the MR jet was  reaching the back of the left atrium.  Given somewhat discrepant data on  the severity of the mitral regurgitation, the fact that the patient's  left atrial size at least by echo is not particularly large, but then  seems large by MRI.  I have recommended that he proceeds with cardiac  catheterization.  We can assess the severity of his mitral  regurgitation, but more importantly degree of pulmonary hypertension, if  there is any.  If the patient has elevated pressures and evidence for  pulmonary hypertension, I do think he should be considered for mitral  valve repair.  If the patient is to proceed with mitral valve surgery,  he has to given preference to go to [facility] in [place].   Please see my original office note regarding allergies, past medical  history, family history, social history, as well as physical  examination.  This note is an extension  addendum to that note to provide  indication for left and right heart catheterization.     Learta Codding, MD,FACC  Electronically Signed    GED/MedQ  DD: 09/05/2008  DT: 09/06/2008  Job #: [id number]   cc:   Doreen Beam, MD

## 2010-10-27 NOTE — Assessment & Plan Note (Signed)
OFFICE VISIT   JAICION, [name]  DOB:  [birth year]                                       11/28/2009  [id number]#:[id number]   Patient presents today for a continued follow-up of his right carotid  endarterectomy for symptomatic carotid disease.  His surgery was on  04/22/2009.  He continues to do well following his procedure.  He  reports some mild peri-incisional numbness, which has continued to  improve.  He has had no focal deficits following his operation.  He has  no discomfort.  He is status post mitral valve repair but has been  stable from a cardiac standpoint.  He does not smoke or drink alcohol.   Review of systems is otherwise unchanged.  He is on a daily aspirin  therapy.   PHYSICAL EXAMINATION:  A well-developed and well-nourished gentleman  appearing stated age of 39.  His blood pressure is 184/65, pulse 60,  respirations 18.  He is in no acute distress.  HEENT is normal.  His  right neck incision is well-healed without any evidence of false  aneurysm.  His chest is clear.  He has 2+ radial pulses bilaterally.  He  is grossly intact neurologically.  Skin without ulcers or rashes.   He underwent repeat carotid duplex today, and I have reviewed this  patient and his son present.  This shows no evidence of stenosis in his  right endarterectomy or his left carotid.   We have recommended that we continue to see him on a yearly basis with  postop follow-up surveillance in our noninvasive vascular lab.     Larina Earthly, M.D.  Electronically Signed   TFE/MEDQ  D:  11/28/2009  T:  11/28/2009  Job:  [id number]   cc:   Doreen Beam, MD

## 2010-10-27 NOTE — Assessment & Plan Note (Signed)
OFFICE VISIT   SHONE, LEVENTHAL  DOB:  07-25-1934                                       04/09/2009  [id number]#:[id number]   The patient presents today for continued discussion regarding his  extracranial cerebrovascular occlusive disease.  I had seen him for a  consultation following a right brain stroke and this evaluation was on  04/04/2009.  He had undergone a CT angiogram of his neck today and I am  seeing him for further discussion.  He is here today with his son.   His review of systems, social history, family history are well  documented on his visit from 1 week ago.  He has had no neurologic  deficits.   Physical examination is unchanged.   I reviewed his CT scan and discussed this at length with the patient and  his son.  This does show a moderate 60% stenosis in his right internal  carotid.  I am concerned that he does have extremely irregular and built  up plaque in this area.  I have recommended right carotid endarterectomy  for reduction of stroke risk.  I explained that with a moderate  stenosis, active right brain symptoms and very irregular plaque that I  felt this was the most appropriate treatment.  I described the procedure  in great detail including potential risk for perioperative stroke at 1-  2% and risk of cranial nerve injury at 1-2% as well.  The patient  understands and will proceed with surgery on 11/09 at [facility].   Larina Earthly, M.D.  Electronically Signed   TFE/MEDQ  D:  04/09/2009  T:  04/10/2009  Job:  [id number]   cc:   Doreen Beam, MD

## 2010-10-27 NOTE — Consult Note (Signed)
NEW PATIENT CONSULTATION   [name], [name] [facility]  DOB:  08/29/1934                                       04/04/2009  [id number]#:[id number]   The patient comes in today for evaluation of recent stroke.  He is very  pleasant 76 year old gentleman who had a recent admission to [facility] for a stroke.  He sudden onset of symptoms with no  prior history of neurologic deficits.  He reports that he had difficulty  with speech and this lasted for several hours.  He also had difficulty  with numbness and weakness in his entire left arm and he has continued  to have persistence of this.  It has improved but he does continue to  have some weakness in his left arm.  Nothing has relieved the weakness  but it is improving with time.  This is not positional.  He had no prior  neurologic deficits.  He is left-handed.  He was admitted for 3 days at  [facility] where he underwent an MRI which reportedly showed a  right brain stroke.  He has a TEE which was reportedly normal.  I will  obtain these results from [facility].   PAST MEDICAL HISTORY:  Significant for prior mitral valve repair and  coronary artery bypass grafting in the same setting in April 2010 at  [facility].  He does not have any history of diabetes.  He is  hypothyroid.  He does have elevated lipids and elevated blood pressure.   FAMILY HISTORY:  Negative for premature atherosclerotic disease.   SOCIAL HISTORY:  He is married with 2 children.  He is a semi-retired  Psychologist, sport and exercise.  He does not smoke or drink alcohol.   REVIEW OF SYSTEMS:  Positive for no weight gain or weight loss.  He has  had no cardiac difficulty following his cardiac surgery.  He does not  have any respiratory difficulty.  He does not require oxygen.  GI:  Positive for prior cholecystectomy in the past but no reflux.  GU:  Has had prior prostatic surgery but no recent problems.  NEUROLOGIC:  Noted for  his recent stroke.  MUSCULOSKELETAL:  Denies arthritis.  Denies any psychiatric illness.  He does have a recent cataract surgery but otherwise no visual changes.   PHYSICAL EXAMINATION:  Well-nourished gentleman appearing stated age of  29.  NECK:  His carotid arteries are without bruits bilaterally.  Has no JVD.  He does not have any thyroid enlargement.  CHEST:  Clear bilaterally without wheezes.  CARDIOVASCULAR:  His heart is regular rate and rhythm.  He has normal  heart sounds.  His radial and dorsalis pedis pulses are 2+ bilaterally.  ABDOMEN:  Soft with no masses, no tenderness and normoactive bowel  sounds.  MUSCULOSKELETAL:  Negative for deformity.  No clubbing or cyanosis.  NEUROLOGIC:  I do not appreciate any significant weakness in his right  arm versus his left arm, but he does have some clumsiness.  SKIN:  Warm without lesions or rashes.   He did undergo repeat duplex in our office to determine if he had a  severe level of stenosis in his right carotid artery.  I did review his  duplex from [facility] with the patient and his son present.  This did show approximately 60% to  79% stenosis in the right carotid.  Our studies today show somewhat less than this, with a 40% to 59%  stenosis at the lower end of this range.  I have discussed the  significance of this and have recommended further workup to include CT  angiogram to determine if he is an irregularity.  I explained that he  does not have any evidence of severe stenosis but that he may have  irregular carotid that would cause symptoms.  He understands this and we  will proceed with CT angiogram, and I will discuss this with him on the  next office visit.  He will notify us should he develop any neurologic  deficits.   Larina Earthly, M.D.  Electronically Signed   TFE/MEDQ  D:  04/04/2009  T:  04/07/2009  Job:  [id number]   cc:   Sherril Croon, M.D.

## 2010-10-27 NOTE — Procedures (Signed)
CAROTID DUPLEX EXAM   INDICATION:  Repeat of a stenosis found at another facility.   HISTORY:  Diabetes:  No.  Cardiac:  CABG, MI.  Hypertension:  No.  Smoking:  No.  Previous Surgery:  CABG.  CV History:  Amaurosis Fugax No, Paresthesias No, Hemiparesis Yes                                       RIGHT             LEFT  Brachial systolic pressure:         139               140  Brachial Doppler waveforms:         Triphasic         Triphasic  Vertebral direction of flow:        Antegrade         Antegrade  DUPLEX VELOCITIES (cm/sec)  CCA peak systolic                   57                72  ECA peak systolic                   74                72  ICA peak systolic                   118               78  ICA end diastolic                   28                27  PLAQUE MORPHOLOGY:                  Mixed             Mixed  PLAQUE AMOUNT:                      Moderate          Mild  PLAQUE LOCATION:                    ICA               ICA   IMPRESSION:  40%-59% stenosis noted in the right internal carotid  artery.   ___________________________________________  Larina Earthly, M.D.   CJ/MEDQ  D:  04/04/2009  T:  04/04/2009  Job:  [id number]

## 2010-10-27 NOTE — Procedures (Signed)
CAROTID DUPLEX EXAM   INDICATION:  Follow up carotid artery disease.   HISTORY:  Diabetes:  No.  Cardiac:  No.  Hypertension:  No.  Smoking:  No.  Previous Surgery:  On 04/2009, right CEA.  CV History:  Stroke in October, 2010 with minimal residual left-sided  symptoms.  Amaurosis Fugax No, Paresthesias Yes, Hemiparesis Yes.                                       RIGHT             LEFT  Brachial systolic pressure:         144               142  Brachial Doppler waveforms:         WNL               WNL  Vertebral direction of flow:        Antegrade         Antegrade  DUPLEX VELOCITIES (cm/sec)  CCA peak systolic                   52                68  ECA peak systolic                   102               89  ICA peak systolic                   68                84  ICA end diastolic                   18                22  PLAQUE MORPHOLOGY:                                    Mixed  PLAQUE AMOUNT:                      None              Mild  PLAQUE LOCATION:                                      CCA/ICA   IMPRESSION:  1. Right internal carotid artery shows no evidence of restenosis,      status post carotid endarterectomy.  2. Left internal carotid artery shows evidence of 1% to 39% stenosis.         ___________________________________________  Larina Earthly, M.D.   AS/MEDQ  D:  11/28/2009  T:  11/28/2009  Job:  [id number]

## 2010-11-16 ENCOUNTER — Other Ambulatory Visit (INDEPENDENT_AMBULATORY_CARE_PROVIDER_SITE_OTHER): Payer: Medicare Other

## 2010-11-16 DIAGNOSIS — I6529 Occlusion and stenosis of unspecified carotid artery: Secondary | ICD-10-CM

## 2010-11-16 DIAGNOSIS — Z48812 Encounter for surgical aftercare following surgery on the circulatory system: Secondary | ICD-10-CM

## 2010-11-26 NOTE — Procedures (Unsigned)
CAROTID DUPLEX EXAM  INDICATION:  Followup carotid stenosis.  HISTORY: Diabetes:  No. Cardiac:  Mitral valve replacement and CABG in 2010. Hypertension:  No. Smoking:  No. Previous Surgery:  Right carotid endarterectomy 04/22/2009. CV History:  Previous CVA in 03/2009. Amaurosis Fugax No, Paresthesias No, Hemiparesis No                                      RIGHT             LEFT Brachial systolic pressure:         148               138 Brachial Doppler waveforms:         WNL               WNL Vertebral direction of flow:        Antegrade         Antegrade DUPLEX VELOCITIES (cm/sec) CCA peak systolic                   96                76 ECA peak systolic                   149               184 ICA peak systolic                   69                84 ICA end diastolic                   14                20 PLAQUE MORPHOLOGY:                  Heterogeneous     Heterogeneous PLAQUE AMOUNT:                      Minimal           Minimal PLAQUE LOCATION:                    CCA, ICA, ECA     CCA, ICA, ECA  IMPRESSION: 1. Right internal carotid artery patent with history of     endarterectomy. 2. Left internal carotid artery stenosis in the 1%-39% range. 3. Bilateral external carotid artery stenosis present. 4. Essentially unchanged since previous study 11/28/2009.  ___________________________________________ Larina Earthly, M.D.  SH/MEDQ  D:  11/16/2010  T:  11/16/2010  Job:  [id number]

## 2010-12-02 ENCOUNTER — Encounter (INDEPENDENT_AMBULATORY_CARE_PROVIDER_SITE_OTHER): Payer: Self-pay | Admitting: General Surgery

## 2010-12-02 ENCOUNTER — Telehealth: Payer: Self-pay | Admitting: *Deleted

## 2010-12-02 NOTE — Telephone Encounter (Signed)
[name] from [facility] called regarding surgical clearance for hernia repair. Surgery is not scheduled yet. Pt last seen May of 2011. [name] is aware a note will be sent to Dr. Earnestine Leys regarding clearance, and she will be contacted upon his review.

## 2010-12-07 ENCOUNTER — Encounter: Payer: Self-pay | Admitting: Cardiology

## 2010-12-07 NOTE — Telephone Encounter (Signed)
Letter dictated

## 2010-12-07 NOTE — Telephone Encounter (Signed)
Letter Faxed

## 2010-12-10 ENCOUNTER — Encounter: Payer: Self-pay | Admitting: Cardiology

## 2011-01-06 ENCOUNTER — Other Ambulatory Visit (INDEPENDENT_AMBULATORY_CARE_PROVIDER_SITE_OTHER): Payer: Self-pay | Admitting: General Surgery

## 2011-01-06 ENCOUNTER — Ambulatory Visit (HOSPITAL_COMMUNITY)
Admission: RE | Admit: 2011-01-06 | Discharge: 2011-01-06 | Disposition: A | Discharge status: Home / Self Care | Payer: Medicare Other | Source: Ambulatory Visit | Attending: General Surgery | Admitting: General Surgery

## 2011-01-06 ENCOUNTER — Encounter (HOSPITAL_COMMUNITY)
Admission: RE | Admit: 2011-01-06 | Discharge: 2011-01-06 | Disposition: A | Discharge status: Home / Self Care | Payer: Medicare Other | Source: Ambulatory Visit | Attending: General Surgery | Admitting: General Surgery

## 2011-01-06 DIAGNOSIS — Z01818 Encounter for other preprocedural examination: Secondary | ICD-10-CM | POA: Insufficient documentation

## 2011-01-06 DIAGNOSIS — K4020 Bilateral inguinal hernia, without obstruction or gangrene, not specified as recurrent: Secondary | ICD-10-CM

## 2011-01-06 DIAGNOSIS — Z01812 Encounter for preprocedural laboratory examination: Secondary | ICD-10-CM | POA: Insufficient documentation

## 2011-01-06 DIAGNOSIS — Z01810 Encounter for preprocedural cardiovascular examination: Secondary | ICD-10-CM | POA: Insufficient documentation

## 2011-01-06 LAB — CBC
HCT: 39 % (ref 39.0–52.0)
Hemoglobin: 12.9 g/dL — ABNORMAL LOW (ref 13.0–17.0)
MCHC: 33.1 g/dL (ref 30.0–36.0)
MCV: 80.1 fL (ref 78.0–100.0)
RDW: 13.2 % (ref 11.5–15.5)

## 2011-01-06 LAB — BASIC METABOLIC PANEL
BUN: 9 mg/dL (ref 6–23)
Creatinine, Ser: 0.97 mg/dL (ref 0.50–1.35)
GFR calc Af Amer: >60 mL/min (ref >60)
GFR calc non Af Amer: >60 mL/min (ref >60)
Glucose, Bld: 90 mg/dL (ref 70–99)
Potassium: 4.8 mEq/L (ref 3.5–5.1)

## 2011-01-08 ENCOUNTER — Encounter: Payer: Self-pay | Admitting: Cardiology

## 2011-01-08 ENCOUNTER — Ambulatory Visit (INDEPENDENT_AMBULATORY_CARE_PROVIDER_SITE_OTHER): Payer: Medicare Other | Admitting: Cardiology

## 2011-01-08 VITALS — BP 139/73 | HR 66 | Resp 20 | Ht 66.0 in | Wt 137.0 lb

## 2011-01-08 DIAGNOSIS — K469 Unspecified abdominal hernia without obstruction or gangrene: Secondary | ICD-10-CM

## 2011-01-08 DIAGNOSIS — I639 Cerebral infarction, unspecified: Secondary | ICD-10-CM

## 2011-01-08 DIAGNOSIS — I340 Nonrheumatic mitral (valve) insufficiency: Secondary | ICD-10-CM

## 2011-01-08 DIAGNOSIS — I2510 Atherosclerotic heart disease of native coronary artery without angina pectoris: Secondary | ICD-10-CM

## 2011-01-08 DIAGNOSIS — I059 Rheumatic mitral valve disease, unspecified: Secondary | ICD-10-CM

## 2011-01-08 DIAGNOSIS — I6350 Cerebral infarction due to unspecified occlusion or stenosis of unspecified cerebral artery: Secondary | ICD-10-CM

## 2011-01-08 DIAGNOSIS — Z951 Presence of aortocoronary bypass graft: Secondary | ICD-10-CM

## 2011-01-08 NOTE — Patient Instructions (Signed)
Continue all current medications. Your physician wants you to follow up in: 6 months.  You will receive a reminder letter in the mail one-two months in advance.  If you don't receive a letter, please call our office to schedule the follow up appointment   

## 2011-01-10 ENCOUNTER — Encounter: Payer: Self-pay | Admitting: Cardiology

## 2011-01-10 DIAGNOSIS — I340 Nonrheumatic mitral (valve) insufficiency: Secondary | ICD-10-CM | POA: Insufficient documentation

## 2011-01-10 DIAGNOSIS — Z951 Presence of aortocoronary bypass graft: Secondary | ICD-10-CM | POA: Insufficient documentation

## 2011-01-10 DIAGNOSIS — I639 Cerebral infarction, unspecified: Secondary | ICD-10-CM | POA: Insufficient documentation

## 2011-01-10 DIAGNOSIS — K469 Unspecified abdominal hernia without obstruction or gangrene: Secondary | ICD-10-CM | POA: Insufficient documentation

## 2011-01-10 NOTE — Assessment & Plan Note (Signed)
LV function previously decreased at 49% current echocardiogram today demonstrates normal LV and RV function.

## 2011-01-10 NOTE — Assessment & Plan Note (Signed)
The patient is status post mitral valve repair. Limited bedside echocardiogram confirms that the repair his durable with no significant stenosis or mitral regurgitation. The patient also reports no heart failure symptoms.

## 2011-01-10 NOTE — Progress Notes (Signed)
HPI The patient is a 76 year old male with a history of mitral valve repair at [facility]. Postoperatively he experienced a stroke and was found to have significant right carotid artery disease requiring right carotid endarterectomy. He has recovered well with no residual hemiplegia. From a cardiac standpoint otherwise he be doing quite well. He went to an episode of depression but he seemed to have improved. He reports no chest pain shortness of breath orthopnea PND. The patient is scheduled for bilateral inguinal hernia surgery August 7. I reviewed his electrocardiogram and chest x-ray today in the office. His EKG shows old inferior Q waves and I compared this to an old EKG with no changes. His chest x-ray is within normal limits. From a cardiovascular standpoint is otherwise stable. A letter has been written to his referring physician to clear him for surgery.  No Known Allergies  No current outpatient prescriptions on file prior to visit.    Past Medical History  Diagnosis Date  . Hemorrhoids   . Hernia   . GERD (gastroesophageal reflux disease)   . Hyperlipemia   . Hypertension   . History of BPH   . Diet-controlled type 2 diabetes mellitus   . Mitral regurgitation      status post mitral valve repair at [facility] in [place]  . CVA (cerebral infarction)     Significant carotid artery disease status post right carotid endarterectomy, postoperatively  . Depression   . Status post coronary artery bypass grafting     Single-vessel coronary bypass grafting at time of mitral valve repair    Past Surgical History  Procedure Date  . Cholecystectomy   . Coronary artery bypass graft   . Mitral valve repair   . Carotid endarterectomy     right  . Turp vaporization   . Cataract extraction   . Nevus removed   . Ercp     with biliary and pancreatic stenting    No family history on file.  History   Social History  . Marital Status: Married    Spouse  Name: N/A    Number of Children: N/A  . Years of Education: N/A   Occupational History  . Not on file.   Social History Main Topics  . Smoking status: Never Smoker   . Smokeless tobacco: Not on file  . Alcohol Use: No  . Drug Use: Not on file  . Sexually Active: Not on file   Other Topics Concern  . Not on file   Social History Narrative  . No narrative on file    [id number] positives as outlined above. The remainder of the 18  point review of systems is negative  PHYSICAL EXAM BP 139/73  Pulse 66  Resp 20  Ht 5\' 6"  (1.676 m)  Wt 137 lb (62.143 kg)  BMI 22.11 kg/m2  SpO2 98%  General: Well-developed, well-nourished in no distress Head: Normocephalic and atraumatic Eyes:PERRLA/EOMI intact, conjunctiva and lids normal Ears: No deformity or lesions Mouth:normal dentition, normal posterior pharynx Neck: Supple, no JVD.  No masses, thyromegaly or abnormal cervical nodes Lungs: Normal breath sounds bilaterally without wheezing.  Normal percussion Cardiac: regular rate and rhythm with normal S1 and S2, no S3 or S4.  PMI is normal.  No pathological murmurs Abdomen: Normal bowel sounds, abdomen is soft and nontender without masses, organomegaly or hernias noted.  No hepatosplenomegaly MSK: Back normal, normal gait muscle strength and tone normal Vascular: Pulse is normal in all 4 extremities  Extremities: No peripheral pitting edema Neurologic: Alert and oriented x 3 Skin: Intact without lesions or rashes Lymphatics: No significant adenopathy Psychologic: Normal affect   ECG: As outlined above. Obtain preoperatively no change from prior tracing  Limited bedside echocardiogram: Normal LV size and contraction. Status post mitral annuloplasty ring, and no significant mitral regurgitation. Apparently functional the mitral valve. Normal right heart structures.  ASSESSMENT AND PLAN

## 2011-01-10 NOTE — Assessment & Plan Note (Signed)
Resolved. No residual symptoms status post right carotid endarterectomy

## 2011-01-10 NOTE — Assessment & Plan Note (Signed)
Patient is scheduled for bilateral inguinal hernia repair. I have written a letter to his referring physician. There are no contraindications to proceed with surgery.

## 2011-01-10 NOTE — Assessment & Plan Note (Signed)
Continue risk factor modification the patient is both on aspirin and statin drug therapy.

## 2011-01-19 ENCOUNTER — Ambulatory Visit (HOSPITAL_COMMUNITY)
Admission: RE | Admit: 2011-01-19 | Discharge: 2011-01-20 | Disposition: A | Discharge status: Home / Self Care | Payer: Medicare Other | Source: Ambulatory Visit | Attending: General Surgery | Admitting: General Surgery

## 2011-01-19 DIAGNOSIS — K219 Gastro-esophageal reflux disease without esophagitis: Secondary | ICD-10-CM | POA: Insufficient documentation

## 2011-01-19 DIAGNOSIS — Z01810 Encounter for preprocedural cardiovascular examination: Secondary | ICD-10-CM | POA: Insufficient documentation

## 2011-01-19 DIAGNOSIS — I10 Essential (primary) hypertension: Secondary | ICD-10-CM | POA: Insufficient documentation

## 2011-01-19 DIAGNOSIS — Z01812 Encounter for preprocedural laboratory examination: Secondary | ICD-10-CM | POA: Insufficient documentation

## 2011-01-19 DIAGNOSIS — K4020 Bilateral inguinal hernia, without obstruction or gangrene, not specified as recurrent: Secondary | ICD-10-CM | POA: Insufficient documentation

## 2011-01-19 DIAGNOSIS — Z01818 Encounter for other preprocedural examination: Secondary | ICD-10-CM | POA: Insufficient documentation

## 2011-01-19 HISTORY — PX: INGUINAL HERNIA REPAIR: SUR1180

## 2011-01-22 ENCOUNTER — Telehealth (INDEPENDENT_AMBULATORY_CARE_PROVIDER_SITE_OTHER): Payer: Self-pay

## 2011-01-22 NOTE — Telephone Encounter (Signed)
Pt called concerned because he has noted some swelling at base of penis. Voiding well. No pain with urination. Pt states is did not indicate this on his po instr sheet and he did not know if he should be concerned. Pt advised swelling is common as long as no fever, no difficulty voiding. Pt to call if these signs occur or if swelling continues to increase.

## 2011-02-03 NOTE — Discharge Summary (Signed)
  NAMEARSALAN, [name]            ACCOUNT NO.:  [id number]  MEDICAL RECORD NO.:  [id number]  LOCATION:  3713                         FACILITY:  [facility]  PHYSICIAN:  Gabrielle Dare. Janee Morn, M.D.DATE OF BIRTH:  26-Jul-1934  DATE OF ADMISSION:  01/19/2011 DATE OF DISCHARGE:  01/20/2011                              DISCHARGE SUMMARY   DISCHARGE DIAGNOSIS:  Status post repair of bilateral inguinal hernia with mesh.  HISTORY OF PRESENT ILLNESS:  [name] is a 76 year old gentleman, who presented for elective repair of bilateral inguinal hernias.  He has a somewhat complex medical history including cardiac disease and cerebrovascular accident in the past.  He underwent preoperative clearance by Dr. Lewayne Bunting from [facility] in [place] and presents for elective surgery.  HOSPITAL COURSE:  The patient underwent uncomplicated bilateral inguinal hernia repair with mesh.  He was stable throughout the surgery.  He was kept overnight on telemetry for observation.  He tolerated gradual advancement of his diet.  He passed his urine well.  He remained afebrile and hemodynamically stable with no significant cardiac issues and is discharged home on postoperative day #1 in stable condition.  DISCHARGE DIET:  Vegetarian as previously.  DISCHARGE MEDICATIONS:  He is to take oxycodone/APAP 5/325 one to two p.o. q.4 h. p.r.n. pain, in addition, he is to continue his home medications including ranitidine 150 mg p.o. daily, levothyroxine 50 mcg p.o. daily, Crestor 5 mg p.o. daily, and Flomax 0.4 mg p.o. at bedtime.  FOLLOWUP:  Follow up in 4 weeks with myself.     Gabrielle Dare Janee Morn, M.D.     BET/MEDQ  D:  01/20/2011  T:  01/20/2011  Job:  [id number]  cc:   Doreen Beam, MD Learta Codding, MD,FACC  Electronically Signed by Violeta Gelinas M.D. on 02/03/2011 07:37:33 AM

## 2011-02-03 NOTE — Op Note (Signed)
NAMEJOSHUA, [name]            ACCOUNT NO.:  [id number]  MEDICAL RECORD NO.:  [id number]  LOCATION:  3713                         FACILITY:  [facility]  PHYSICIAN:  Gabrielle Dare. Janee Morn, M.D.DATE OF BIRTH:  09/25/1934  DATE OF PROCEDURE:  01/19/2011 DATE OF DISCHARGE:                              OPERATIVE REPORT   PREOPERATIVE DIAGNOSIS:  Bilateral inguinal hernia.  POSTOPERATIVE DIAGNOSIS:  Bilateral inguinal hernia.  PROCEDURE:  Repair of bilateral inguinal hernia with mesh.  SURGEON:  Gabrielle Dare. Janee Morn, MD  ANESTHESIA:  General with laryngeal mask airway.  FINDINGS:  Bilateral direct inguinal hernias.  HISTORY OF PRESENT ILLNESS:  Mr. [name] is a 76 year old [place] American gentleman who I evaluated in the office for symptomatic bilateral inguinal hernias.  He recently had a cerebrovascular accident, however, has recovered significantly from that.  Most of his symptoms of plegia have resolved.  Afterwards she had carotid endarterectomy that was uncomplicated.  He does have history of cardiac disease including mitral valve repair at [facility] in [facility] which preceded his cerebrovascular accident.  Ejection fraction was 50%.  He has had no heart failure symptoms and he underwent cardiac clearance by Dr. Andee Lineman from [facility] and proceeds today for elective repair of his hernias bilaterally.  PROCEDURE IN DETAIL:  Informed consent was obtained.  The patient was identified and sites were indicated.  He received intravenous antibiotics.  He was brought to the operating room.  General anesthesia with laryngeal mask airway was administered by the anesthesia staff. His abdomen and bilateral groins were prepped and draped in sterile fashion.  A 0.25% Marcaine with epinephrine was injected out towards the right side of the anterior-superior iliac spine and then down along the planned line of incision.  Right groin incision was made.  Subcutaneous tissues  were dissected down through Scarpa fascia revealing the external oblique fascia.  This was divided sharply out laterally and down through the external ring.  The superior leaflet of the external oblique was dissected free off the transversalis and the inferior leaflet was dissected down revealing the shelving edge of the inguinal ligament. The cord structures were encircled with Penrose drain.  Dissection of the cord revealed his hernia to be direct hernia that was moderate in size.  It was completely freed up from the cord structures and reduced easily.  The cord structures were intact.  There was no evidence of indirect hernia.  The direct hernia sac was kept reduced with several interrupted 2-0 Vicryl sutures from the shelving edge of inguinal ligament up to the transversalis.  Next, the formal hernia repair was completed with a keyhole polypropylene mesh that was fashioned in custom size and shape.  This was packed with tissues of the pubic tubercle medially with 2-0 Prolene in a running fashion.  The mesh was secured to the shelving edge of the inguinal ligament inferiorly.  Next several interrupted 2-0 Prolene sutures were used to tack the mesh down again to the tissues over the pubic tubercle medially and then out along the transversalis extending laterally.  The 2 leaflets of the mesh were rejoined behind the cord structures and tucked out underneath the lateral aspect of the external oblique  fascia.  These leaflets were tacked together and down to the underlying musculature with several interrupted 2-0 Vicryl sutures present on this side.  It appeared the patient's inguinal branch of the ilioinguinal nerve can be danger of entrapment with the mesh, so it was sectioned and removed.  The area was irrigated.  Meticulous hemostasis was obtained.  Additional local anesthetic injected.  External oblique fascia was then closed with running 2-0 Vicryl suture.  Scarpa fascia was closed  with interrupted 3- 0 Vicryl suture.  The skin was closed with running 4-0 Monocryl subcuticular stitch followed by Dermabond.  Next, attention was directed to the left side.  Similarly we injected 0.25% Marcaine with epinephrine out towards the anterior superior iliac spine and along the planned line of incision.  The left inguinal incision was made.  Subcutaneous tissues were dissected down using Bovie cautery through Scarpa fascia revealing external oblique fascia.  This was divided sharply and laterally and down through the external ring.  Superior leaflet was dissected free off the transversalis and the inferior leaflet of the fascia was dissected free down revealing the shelving edge of inguinal ligament.  Cord structures were encircled with a Penrose drain.  Cord structures were dissected and revealed a similar hernia on this side which was a direct hernia.  This was bluntly freed up from the cord structures.  Cord structures were intact and did not have any indirect hernia.  The direct hernia was reduced and kept in place with several interrupted 2-0 Vicryl sutures from the transversalis to the shelving edge of inguinalligament.  Next the hernia was repaired formally with a keyhole polypropylene mesh cut to custom size and shape.  This was tacked to the tissues over the pubic tubercle medially with 2-0 Prolene suture and then in a running fashion it was secured to the shelving edge of the inguinal ligament extending out laterally in the inferior aspect.  Next the superior part of the mesh was again tacked with 2-0 Prolene in interrupted basis, first the tissues over the pubic tubercle medially, then extending out laterally to the transversalis.  Next 2 leaflets of the mesh were rejoined behind the cord structures and tacked together and down to the underlying musculature with interrupted 2-0 Prolene sutures and the leaflets were extended out laterally beneath the inguinal ligament  aperture in the mesh, admitting the tip of fifth digit and cord structures remained viable and nonedematous just as on the other side.  Next the area was copiously irrigated.  Meticulous hemostasis was ensured.  Some additional local anesthetic was injected. The external oblique fascia was closed with running 2-0 Vicryl suture. Scarpa fascia was closed with interrupted 3-0 Vicryl suture and the skin was closed with running 4-0 Monocryl subcuticular stitch followed by Dermabond.  Sponge, needle, and instrument counts were all correct.  The patient tolerated the procedure well without apparent complication and was taken to recovery room in stable condition.  We will plan to admit him overnight on telemetry in light of his history.     Gabrielle Dare Janee Morn, M.D.     BET/MEDQ  D:  01/19/2011  T:  01/19/2011  Job:  [id number]  cc:   Learta Codding, MD,FACC Doreen Beam, MD  Electronically Signed by Violeta Gelinas M.D. on 02/03/2011 07:37:39 AM

## 2011-02-09 ENCOUNTER — Ambulatory Visit: Payer: Medicare Other | Admitting: Cardiology

## 2011-02-16 ENCOUNTER — Encounter (INDEPENDENT_AMBULATORY_CARE_PROVIDER_SITE_OTHER): Payer: Self-pay

## 2011-02-17 ENCOUNTER — Ambulatory Visit (INDEPENDENT_AMBULATORY_CARE_PROVIDER_SITE_OTHER): Payer: Medicare Other | Admitting: General Surgery

## 2011-02-17 ENCOUNTER — Encounter (INDEPENDENT_AMBULATORY_CARE_PROVIDER_SITE_OTHER): Payer: Self-pay | Admitting: General Surgery

## 2011-02-17 VITALS — BP 132/64 | HR 68

## 2011-02-17 DIAGNOSIS — Z9889 Other specified postprocedural states: Secondary | ICD-10-CM

## 2011-02-17 DIAGNOSIS — Z8719 Personal history of other diseases of the digestive system: Secondary | ICD-10-CM

## 2011-02-17 NOTE — Progress Notes (Signed)
Subjective:     Patient ID: [name], male   DOB: 1935-02-15, 76 y.o.   MRN: [id number]  HPI Patient presents status post bilateral inguinal hernia repair with mesh. He's doing very well. Swelling has resolved. He is having some burning sensation in his incisions. He initially had some mild penile edema which has resolved. He has minimal scrotal discomfort at this time. His strength has returned. He is no longer taking any pain medication.  Review of Systems     Objective:   Physical Exam On exam both incisions are well-healed without signs of infection. Both hernia repairs are intact. Testes are descended and there is no significant testicular edema. There is no tenderness on examination. There no signs of infection.    Assessment:     Doing extremely well status post bilateral inguinal hernia repair with mesh    Plan:     Avoid heavy lifting for a total of 6 weeks after surgery and return p.r.n.

## 2011-02-19 ENCOUNTER — Other Ambulatory Visit: Payer: Self-pay | Admitting: Orthopaedic Surgery

## 2011-02-19 DIAGNOSIS — IMO0002 Reserved for concepts with insufficient information to code with codable children: Secondary | ICD-10-CM

## 2011-02-23 ENCOUNTER — Ambulatory Visit
Admission: RE | Admit: 2011-02-23 | Discharge: 2011-02-23 | Disposition: A | Discharge status: Home / Self Care | Payer: Medicare Other | Source: Ambulatory Visit | Attending: Orthopaedic Surgery | Admitting: Orthopaedic Surgery

## 2011-02-23 DIAGNOSIS — IMO0002 Reserved for concepts with insufficient information to code with codable children: Secondary | ICD-10-CM

## 2011-02-23 IMAGING — CT CT ANGIO NECK
2 of 8 series · 5 of 33 positions shown · IV contrast ([ID] OMNI 350)
Comparison: None.

CLINICAL DATA: 74-year-old male preoperative study for planned
right carotid surgery.

CT ANGIOGRAPHY NECK
TECHNIQUE: Multidetector CT imaging of the neck was performed
using the standard protocol during bolus administration of
intravenous contrast. Multiplanar CT image reconstructions
including MIPs were obtained to evaluate the vascular anatomy.
Carotid stenosis measurements (when applicable) are obtained
utilizing NASCET criteria, using the distal internal carotid
diameter as the denominator.
Contrast: 100 ml Omnipaque 350.

[Series 401: cor thin · axial · 0.51mm/px · z∈[+47,+189]mm · 4 of 238 slices shown]
[im 48/238  soft-tissue]
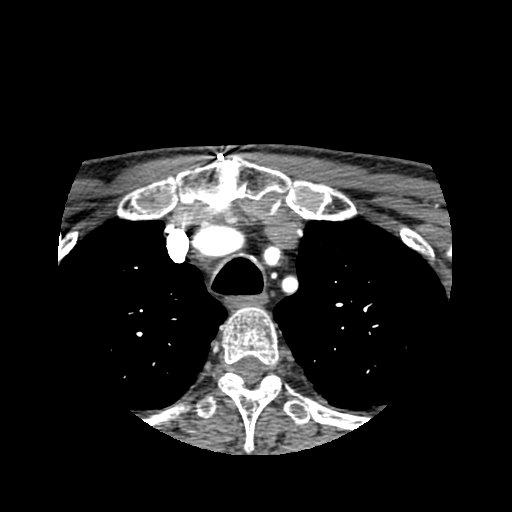
[im 95/238  bone]
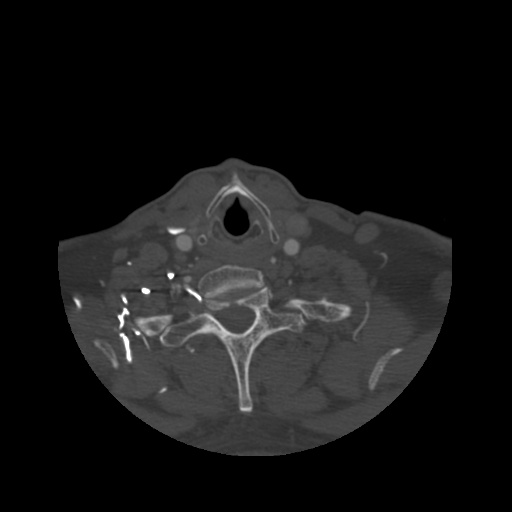
[im 143/238  soft-tissue]
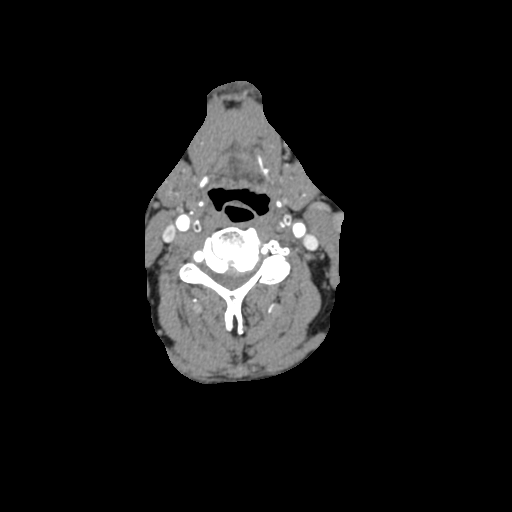
[im 190/238  bone]
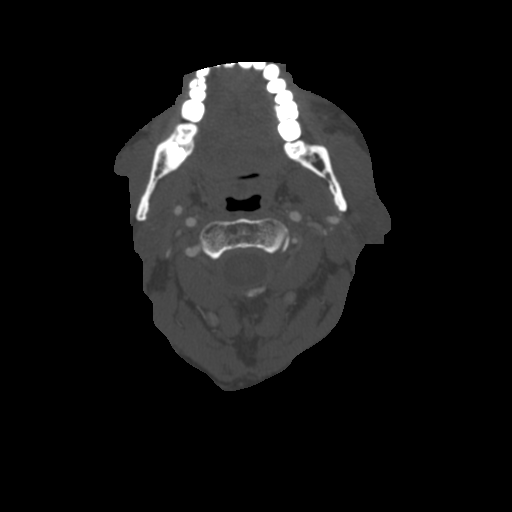

[Series 402: sag thin · sagittal · 0.51mm/px · 1 of 154 slices shown]
[im 77/154  soft-tissue]
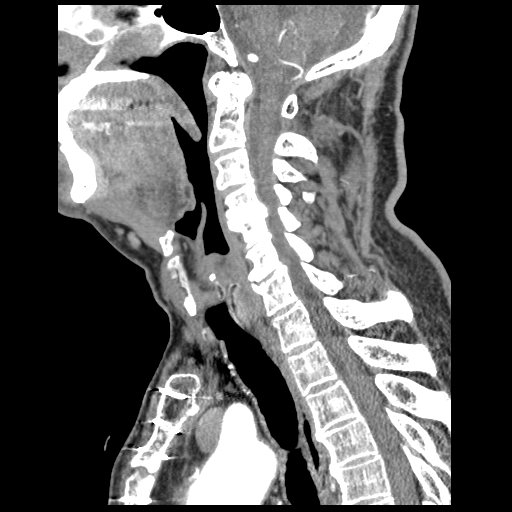

[5 of 33 positions shown; findings below may reference images not displayed]

FINDINGS: Sequelae of median sternotomy and cardiac valve
replacement evident on the scout view.  Degenerative changes in the
cervical spine. No acute osseous abnormality identified.
Incidental small tracheal diverticulum at the thoracic inlet. No
lymphadenopathy.  Pharyngeal mucosal spaces and the thyroid are
within normal limits.  Parapharyngeal, retropharyngeal and
sublingual spaces are within normal limits.  Submandibular and
parotid glands are within normal limits.

Vascular Findings: Four vessel arch configuration, the left
vertebral artery arises directly from the arch.  Soft plaque
involving the left subclavian artery and left common carotid artery
origins without hemodynamic significance. Right common carotid
artery origin is normal.  Right vertebral artery origin is
partially obscured by dense contrast in the adjacent veins but
appears without hemodynamically significant stenosis.

Right vertebral artery is mildly dominant in the neck, and there is
no vertebral artery stenosis identified.  The left vertebral
functionally terminates in PICA.  Vertebrobasilar junction and
proximal basilar artery are normal.

The right common carotid artery is within normal limits.  The right
carotid bifurcation there is extensive low density plaque involving
the posterior right ICA bulb the (series 5 image 69) subsequent
stenosis measures 55 - 60 % with respect to the distal vessel.
This plaque continues throughout the posterior right ICA bulb.  The
more distal cervical right ICA is normal and visualized right ICA
siphon is patent.

The left common carotid artery beyond the mild origin plaque is
within normal limits to the level of the bifurcation.  At the left
ICA origin and there is minimal calcified plaque resulting in less
than 20 % stenosis with respect to the distal vessel.  Beyond this
level the visualized left ICA and left ICA siphon are normal.

 Review of the MIP images confirms the above findings.
IMPRESSION: 1.  Extensive atherosclerotic plaque (predominately low density
such as lipid laden plaque) at the right ICA origin and involving
the bulb.  Subsequent right ICA stenosis measures 55 - 60 % with
respect to the distal vessel.
2.  Minor left carotid atherosclerosis without significant
stenosis.
3.  Left vertebral artery arises from the aortic arch.
4.  No acute findings in the neck.

## 2011-02-23 MED ORDER — GADOBENATE DIMEGLUMINE 529 MG/ML IV SOLN
15.0000 mL | Freq: Once | INTRAVENOUS | Status: AC | PRN
  Administered 2011-02-23: 15 mL via INTRAVENOUS

## 2011-06-23 ENCOUNTER — Encounter (INDEPENDENT_AMBULATORY_CARE_PROVIDER_SITE_OTHER): Payer: Medicare Other | Admitting: General Surgery

## 2011-06-30 ENCOUNTER — Encounter (INDEPENDENT_AMBULATORY_CARE_PROVIDER_SITE_OTHER): Payer: Medicare Other | Admitting: General Surgery

## 2011-08-04 ENCOUNTER — Encounter (INDEPENDENT_AMBULATORY_CARE_PROVIDER_SITE_OTHER): Payer: Self-pay | Admitting: General Surgery

## 2011-08-04 ENCOUNTER — Ambulatory Visit (INDEPENDENT_AMBULATORY_CARE_PROVIDER_SITE_OTHER): Payer: Medicare Other | Admitting: General Surgery

## 2011-08-04 VITALS — BP 116/68 | HR 81 | Temp 98.0°F | Ht 66.0 in | Wt 147.4 lb

## 2011-08-04 DIAGNOSIS — A630 Anogenital (venereal) warts: Secondary | ICD-10-CM | POA: Insufficient documentation

## 2011-08-04 NOTE — Progress Notes (Signed)
Subjective:     Patient ID: [name], male   DOB: 1934-08-19, 77 y.o.   MRN: [id number]  HPI Patient is well-known to me status post bilateral inguinal hernia repair last year. He has some perianal lesions which were bothering him with some itching recently. There is one small area behind his anus and one over on the left side. He applied some antibiotic ointment and over-the-counter hemorrhoid creams and now he is asymptomatic. He's had no bleeding He also noted some left inguinal soreness after having a severe cough and cold last month. Noted no bulge in the area and this pinching sensation is resolved. Review of Systems     Objective:   Physical Exam Right and left inguinal region revealed no recurrence of his inguinal hernias. Testes are descended and nonedematous. No tenderness Perianal region reveals a small condyloma posterior to the anal area there is also a larger 1 cm cluster over on the left side. There is no ulceration or other abnormality.    Assessment:     Perianal condylomata    Plan:     I discussed the diagnosis including its relation to HPV with the patient. I have offered to remove these areas but since they are now asymptomatic he is somewhat reluctant. I will see him back in 2 months to see if they have changed. If the areas are getting larger we may proceed with excision. Biopsy would be done at that time as there is a very small chance of this turning into  squamous cell carcinoma. This was discussed in detail with the patient. That risk is very low.

## 2011-09-29 ENCOUNTER — Encounter (INDEPENDENT_AMBULATORY_CARE_PROVIDER_SITE_OTHER): Payer: Self-pay | Admitting: General Surgery

## 2011-09-29 ENCOUNTER — Ambulatory Visit (INDEPENDENT_AMBULATORY_CARE_PROVIDER_SITE_OTHER): Payer: Medicare Other | Admitting: General Surgery

## 2011-09-29 VITALS — BP 148/70 | HR 72 | Temp 97.4°F | Resp 16 | Ht 66.0 in | Wt 148.4 lb

## 2011-09-29 DIAGNOSIS — A630 Anogenital (venereal) warts: Secondary | ICD-10-CM

## 2011-09-29 NOTE — Patient Instructions (Signed)
Stop your Aspirin 5 days preoperatively

## 2011-09-29 NOTE — Progress Notes (Signed)
Patient ID: [name], male   DOB: 1934/07/03, 77 y.o.   MRN: [id number]  Chief Complaint  Patient presents with  . Follow-up    reck perianal skin    HPI [name] is a 77 y.o. male.   HPIThis patient previously for perianal condylomata. He presents for followup. He has had no significant change in symptoms but is very concerned about the lesions. Bowel function has been normal. Anal pruritus has improved. Past Medical History  Diagnosis Date  . Hemorrhoids   . Hernia   . GERD (gastroesophageal reflux disease)   . Hyperlipemia   . Hypertension   . History of BPH   . Diet-controlled type 2 diabetes mellitus   . Mitral regurgitation      status post mitral valve repair at [facility] in [place]  . CVA (cerebral infarction)     Significant carotid artery disease status post right carotid endarterectomy, postoperatively  . Depression   . Status post coronary artery bypass grafting     Single-vessel coronary bypass grafting at time of mitral valve repair  . Heart murmur   . Thyroid disease     hypothyroidism  . Stroke     Past Surgical History  Procedure Date  . Cholecystectomy   . Coronary artery bypass graft   . Mitral valve repair   . Carotid endarterectomy     right  . Turp vaporization   . Cataract extraction   . Nevus removed   . Ercp     with biliary and pancreatic stenting  . Inguinal hernia repair 01/19/2011    Bilateral    History reviewed. No pertinent family history.  Social History History  Substance Use Topics  . Smoking status: Never Smoker   . Smokeless tobacco: Not on file  . Alcohol Use: No    No Known Allergies  Current Outpatient Prescriptions  Medication Sig Dispense Refill  . aspirin 325 MG tablet Take 325 mg by mouth daily.        Marland Kitchen docusate sodium (COLACE) 100 MG capsule Take 100 mg by mouth as needed.       Marland Kitchen levothyroxine (LEVOTHROID) 50 MCG tablet Take 50 mcg by mouth daily.        . ranitidine  (ZANTAC) 150 MG tablet Take 150 mg by mouth 2 (two) times daily.        . rosuvastatin (CRESTOR) 5 MG tablet Take 5 mg by mouth daily.        . Tamsulosin HCl (FLOMAX) 0.4 MG CAPS Take 0.4 mg by mouth.        . vitamin B-12 (CYANOCOBALAMIN) 500 MCG tablet Take 500 mcg by mouth daily.          Review of Systems Review of Systems  Blood pressure 148/70, pulse 72, temperature 97.4 F (36.3 C), temperature source Temporal, resp. rate 16, height 5\' 6"  (1.676 m), weight 148 lb 6.4 oz (67.314 kg).  Physical Exam Physical Exam  HENT:  Head: Normocephalic and atraumatic.  Eyes: Pupils are equal, round, and reactive to light.  Neck: Normal range of motion. No tracheal deviation present.  Cardiovascular: Normal rate, regular rhythm, normal heart sounds and intact distal pulses.   Pulmonary/Chest: Effort normal and breath sounds normal. No respiratory distress. He has no wheezes. He has no rales.  Abdominal: Soft. He exhibits no distension. There is no tenderness. There is no rebound.       Perianal region reveals condylomata in the posterior  midline, some small intra-anal condylomata, and a larger cluster together on the left side    Data Reviewed Office notes from Dr. Sherril Croon  Assessment    Perianal condylomata   Plan    I have offered excision under anesthesia as a outpatient procedure. The patient is concerned about a small risk of is becoming cancer. Procedure, risks, and benefits were discussed in detail. He is agreeable.       Rayshawn Visconti E 09/29/2011, 2:12 PM

## 2011-10-11 ENCOUNTER — Telehealth (INDEPENDENT_AMBULATORY_CARE_PROVIDER_SITE_OTHER): Payer: Self-pay | Admitting: General Surgery

## 2011-10-11 NOTE — Telephone Encounter (Signed)
Pt calling to get a message to Dr. Janee Morn regarding upcoming surgery.  His son (also a physician, but not a Careers adviser) suggested he get general anesthesia.  Pt states he wants it to be up to Dr. Carollee Massed best judgement which one he gets.

## 2011-10-12 NOTE — Telephone Encounter (Signed)
The plan is for general anesthesia

## 2011-11-16 ENCOUNTER — Telehealth (INDEPENDENT_AMBULATORY_CARE_PROVIDER_SITE_OTHER): Payer: Self-pay

## 2011-11-16 NOTE — Telephone Encounter (Signed)
Pt called stating he is still having discomfort at left hernia site that was repaired august 2012. Pt states he was to let Dr Janee Morn know if this discomfort does not resolve. Pt states no bulge or swelling. Pt offered appt to see Dr Janee Morn.  Pt requests area be checked while he is in holding area prior to 6-24 surgery. I advised pt I will send a note to Dr Janee Morn re: this.

## 2011-11-30 ENCOUNTER — Telehealth (INDEPENDENT_AMBULATORY_CARE_PROVIDER_SITE_OTHER): Payer: Self-pay | Admitting: General Surgery

## 2011-11-30 NOTE — Telephone Encounter (Signed)
Tried to call patient about pain question.  No answer and no machine.

## 2011-12-02 ENCOUNTER — Encounter: Payer: Self-pay | Admitting: Neurosurgery

## 2011-12-03 ENCOUNTER — Other Ambulatory Visit: Payer: Medicare Other

## 2011-12-03 ENCOUNTER — Ambulatory Visit (INDEPENDENT_AMBULATORY_CARE_PROVIDER_SITE_OTHER): Payer: Medicare Other | Admitting: Neurosurgery

## 2011-12-03 ENCOUNTER — Encounter: Payer: Self-pay | Admitting: Neurosurgery

## 2011-12-03 ENCOUNTER — Ambulatory Visit (INDEPENDENT_AMBULATORY_CARE_PROVIDER_SITE_OTHER): Payer: Medicare Other | Admitting: Vascular Surgery

## 2011-12-03 VITALS — BP 124/66 | HR 59 | Resp 16 | Ht 66.0 in | Wt 149.4 lb

## 2011-12-03 DIAGNOSIS — Z48812 Encounter for surgical aftercare following surgery on the circulatory system: Secondary | ICD-10-CM

## 2011-12-03 DIAGNOSIS — I6529 Occlusion and stenosis of unspecified carotid artery: Secondary | ICD-10-CM

## 2011-12-03 NOTE — Progress Notes (Signed)
[facility] Office Note  CC: Annual carotid duplex Referring Physician: Early  History of Present Illness: 77 year old male patient of Dr. early is who is status post a right CEA in November 2010 which was precluded by a CVA in October 2010. The patient denies any signs or symptoms of CVA, TIA, amaurosis fugax or any neural deficit. The patient also denies any new medical diagnoses or recent surgery. The patient does report some transient vertigo however this is not a new finding and the patient states is going on for some time and his primary care physician is aware of this and has seen him in evaluation for this problem.  Past Medical History  Diagnosis Date  . Hemorrhoids   . Hernia   . GERD (gastroesophageal reflux disease)   . Hyperlipemia   . Hypertension   . History of BPH   . Diet-controlled type 2 diabetes mellitus   . Mitral regurgitation      status post mitral valve repair at [facility] in [place]  . CVA (cerebral infarction)     Significant carotid artery disease status post right carotid endarterectomy, postoperatively  . Depression   . Status post coronary artery bypass grafting     Single-vessel coronary bypass grafting at time of mitral valve repair  . Heart murmur   . Thyroid disease     hypothyroidism  . Stroke Oct 2010    ROS: [x]  Positive   [ ]  Denies    General: [ ]  Weight loss, [ ]  Fever, [ ]  chills Neurologic: [ ]  Dizziness, [ ]  Blackouts, [ ]  Seizure [ ]  Stroke, [ ]  "Mini stroke", [ ]  Slurred speech, [ ]  Temporary blindness; [ ]  weakness in arms or legs, [ ]  Hoarseness Cardiac: [ ]  Chest pain/pressure, [ ]  Shortness of breath at rest [ ]  Shortness of breath with exertion, [ ]  Atrial fibrillation or irregular heartbeat Vascular: [ ]  Pain in legs with walking, [ ]  Pain in legs at rest, [ ]  Pain in legs at night,  [ ]  Non-healing ulcer, [ ]  Blood clot in vein/DVT,   Pulmonary: [ ]  Home oxygen, [ ]   Productive cough, [ ]  Coughing up blood, [ ]  Asthma,  [ ]  Wheezing Musculoskeletal:  [ ]  Arthritis, [ ]  Low back pain, [ ]  Joint pain Hematologic: [ ]  Easy Bruising, [ ]  Anemia; [ ]  Hepatitis Gastrointestinal: [ ]  Blood in stool, [ ]  Gastroesophageal Reflux/heartburn, [ ]  Trouble swallowing Urinary: [ ]  chronic Kidney disease, [ ]  on HD - [ ]  MWF or [ ]  TTHS, [ ]  Burning with urination, [ ]  Difficulty urinating Skin: [ ]  Rashes, [ ]  Wounds Psychological: [ ]  Anxiety, [ ]  Depression   Social History History  Substance Use Topics  . Smoking status: Never Smoker   . Smokeless tobacco: Not on file  . Alcohol Use: No    Family History History reviewed. No pertinent family history.  No Known Allergies  Current Outpatient Prescriptions  Medication Sig Dispense Refill  . aspirin 325 MG tablet Take 325 mg by mouth daily.        Marland Kitchen docusate sodium (COLACE) 100 MG capsule Take 100 mg by mouth as needed.       Marland Kitchen levothyroxine (LEVOTHROID) 50 MCG tablet Take 50 mcg by mouth daily.        . ranitidine (ZANTAC) 150 MG tablet Take 150 mg by mouth 2 (two) times daily.        Marland Kitchen  rosuvastatin (CRESTOR) 5 MG tablet Take 5 mg by mouth daily.        . Tamsulosin HCl (FLOMAX) 0.4 MG CAPS Take 0.4 mg by mouth.        . vitamin B-12 (CYANOCOBALAMIN) 500 MCG tablet Take 500 mcg by mouth daily.          Physical Examination  Filed Vitals:   12/03/11 1045  BP: 124/66  Pulse: 59  Resp:     Body mass index is 24.11 kg/(m^2).  General:  WDWN in NAD Gait: Normal HEENT: WNL Eyes: Pupils equal Pulmonary: normal non-labored breathing , without Rales, rhonchi,  wheezing Cardiac: RRR, without  Murmurs, rubs or gallops; Abdomen: soft, NT, no masses Skin: no rashes, ulcers noted  Vascular Exam Pulses: 3+ radial pulses bilaterally Carotid bruits: Carotid pulses to auscultation no bruits are heard Extremities without ischemic changes, no Gangrene , no cellulitis; no open wounds;  Musculoskeletal: no  muscle wasting or atrophy   Neurologic: A&O X 3; Appropriate Affect ; SENSATION: normal; MOTOR FUNCTION:  moving all extremities equally. Speech is fluent/normal  Non-Invasive Vascular Imaging CAROTID DUPLEX 12/03/2011  Right ICA 0 - 19% stenosis Left ICA 20 - 39 % stenosis   ASSESSMENT/PLAN: Asymptomatic patient with mild bilateral carotid stenosis. The patient will followup here in one year with repeat carotid duplex. His questions were encouraged and answered.  Lauree Chandler ANP   Clinic MD: Imogene Burn

## 2011-12-06 ENCOUNTER — Ambulatory Visit: Payer: Medicare Other | Admitting: Cardiology

## 2011-12-17 ENCOUNTER — Ambulatory Visit: Payer: Medicare Other | Admitting: Cardiology

## 2011-12-28 NOTE — Procedures (Unsigned)
CAROTID DUPLEX EXAM  INDICATION:  Carotid stenosis.  HISTORY: Diabetes:  No Cardiac:  CABG in 2010 Hypertension:  No Smoking:  No Previous Surgery:  Right carotid endarterectomy 04/22/2009. CV History:  Previous CVA October, 2010, currently asymptomatic. Amaurosis Fugax No, Paresthesias No, Hemiparesis No                                      RIGHT             LEFT Brachial systolic pressure:         141               134 Brachial Doppler waveforms:         WNL               WNL Vertebral direction of flow:        Antegrade         Antegrade DUPLEX VELOCITIES (cm/sec) CCA peak systolic                   66                79 ECA peak systolic                   129               133 ICA peak systolic                   57                69 ICA end diastolic                   14                16 PLAQUE MORPHOLOGY:                  Heterogeneous     Heterogeneous PLAQUE AMOUNT:                      Mild              Mild PLAQUE LOCATION:                    CCA               CCA/ICA  IMPRESSION:  Patent right internal carotid artery with history of endarterectomy, no hyperplasia or hemodynamically significant plaque identified. Bilateral external carotid arteries appear patent. Left internal carotid artery stenosis present in the 1% to 39% range. Bilateral vertebral arteries are patent and antegrade. Essentially unchanged since previous study on 11/16/2010.  ___________________________________________ Larina Earthly, M.D.  SH/MEDQ  D:  12/03/2011  T:  12/03/2011  Job:  [id number]

## 2011-12-28 NOTE — Progress Notes (Signed)
Talked with pt and sons-english very good Had bilat INH 8/12-did well-cardiac clearence given for that-had ov with dr degent 7/13-dr office called to rs due to dr schedule Pt has had no cardiac issue since last OV. Will need istat

## 2011-12-31 ENCOUNTER — Encounter (HOSPITAL_BASED_OUTPATIENT_CLINIC_OR_DEPARTMENT_OTHER): Payer: Self-pay | Admitting: *Deleted

## 2012-01-03 ENCOUNTER — Ambulatory Visit (HOSPITAL_BASED_OUTPATIENT_CLINIC_OR_DEPARTMENT_OTHER): Payer: Medicare Other | Admitting: Anesthesiology

## 2012-01-03 ENCOUNTER — Encounter (HOSPITAL_BASED_OUTPATIENT_CLINIC_OR_DEPARTMENT_OTHER): Payer: Self-pay | Admitting: Anesthesiology

## 2012-01-03 ENCOUNTER — Encounter (HOSPITAL_BASED_OUTPATIENT_CLINIC_OR_DEPARTMENT_OTHER)
Admission: RE | Disposition: A | Discharge status: Home / Self Care | Payer: Self-pay | Source: Ambulatory Visit | Attending: General Surgery

## 2012-01-03 ENCOUNTER — Ambulatory Visit (HOSPITAL_BASED_OUTPATIENT_CLINIC_OR_DEPARTMENT_OTHER)
Admission: RE | Admit: 2012-01-03 | Discharge: 2012-01-03 | Disposition: A | Discharge status: Home / Self Care | Payer: Medicare Other | Source: Ambulatory Visit | Attending: General Surgery | Admitting: General Surgery

## 2012-01-03 DIAGNOSIS — I2510 Atherosclerotic heart disease of native coronary artery without angina pectoris: Secondary | ICD-10-CM | POA: Insufficient documentation

## 2012-01-03 DIAGNOSIS — Z8673 Personal history of transient ischemic attack (TIA), and cerebral infarction without residual deficits: Secondary | ICD-10-CM | POA: Insufficient documentation

## 2012-01-03 DIAGNOSIS — I10 Essential (primary) hypertension: Secondary | ICD-10-CM | POA: Insufficient documentation

## 2012-01-03 DIAGNOSIS — K219 Gastro-esophageal reflux disease without esophagitis: Secondary | ICD-10-CM | POA: Insufficient documentation

## 2012-01-03 DIAGNOSIS — L821 Other seborrheic keratosis: Secondary | ICD-10-CM

## 2012-01-03 DIAGNOSIS — A630 Anogenital (venereal) warts: Secondary | ICD-10-CM

## 2012-01-03 HISTORY — PX: WART FULGURATION: SHX5245

## 2012-01-03 LAB — POCT I-STAT, CHEM 8
BUN: 9 mg/dL (ref 6–23)
HCT: 41 % (ref 39.0–52.0)
Sodium: 143 mEq/L (ref 135–145)
TCO2: 23 mmol/L (ref 0–100)

## 2012-01-03 SURGERY — FULGURATION, WART, ANUS
Anesthesia: General | Site: Anus | Wound class: Clean Contaminated

## 2012-01-03 MED ORDER — FENTANYL CITRATE 0.05 MG/ML IJ SOLN
INTRAMUSCULAR | Status: DC | PRN
  Administered 2012-01-03: 50 ug via INTRAVENOUS

## 2012-01-03 MED ORDER — PROPOFOL 10 MG/ML IV EMUL
INTRAVENOUS | Status: DC | PRN
  Administered 2012-01-03: 150 mg via INTRAVENOUS

## 2012-01-03 MED ORDER — METOCLOPRAMIDE HCL 5 MG/ML IJ SOLN
INTRAMUSCULAR | Status: DC | PRN
  Administered 2012-01-03: 10 mg via INTRAVENOUS

## 2012-01-03 MED ORDER — BUPIVACAINE-EPINEPHRINE 0.5% -1:200000 IJ SOLN
INTRAMUSCULAR | Status: DC | PRN
  Administered 2012-01-03: 20 mL

## 2012-01-03 MED ORDER — HYDROMORPHONE HCL PF 1 MG/ML IJ SOLN: 0.2500 mg | INTRAMUSCULAR | Status: DC | PRN

## 2012-01-03 MED ORDER — OXYCODONE-ACETAMINOPHEN 5-325 MG PO TABS: 1.0000 | ORAL_TABLET | Freq: Four times a day (QID) | ORAL | Status: AC | PRN

## 2012-01-03 MED ORDER — LIDOCAINE HCL (CARDIAC) 20 MG/ML IV SOLN
INTRAVENOUS | Status: DC | PRN
  Administered 2012-01-03: 50 mg via INTRAVENOUS

## 2012-01-03 MED ORDER — OXYCODONE HCL 5 MG PO TABS
5.0000 mg | ORAL_TABLET | Freq: Once | ORAL | Status: AC | PRN
  Administered 2012-01-03: 5 mg via ORAL

## 2012-01-03 MED ORDER — PREGABALIN 75 MG PO CAPS: 75.0000 mg | ORAL_CAPSULE | Freq: Two times a day (BID) | ORAL | Status: DC

## 2012-01-03 MED ORDER — ACETAMINOPHEN 10 MG/ML IV SOLN
1000.0000 mg | Freq: Once | INTRAVENOUS | Status: AC
  Administered 2012-01-03: 1000 mg via INTRAVENOUS

## 2012-01-03 MED ORDER — LACTATED RINGERS IV SOLN
INTRAVENOUS | Status: DC
  Administered 2012-01-03: 07:00:00 via INTRAVENOUS

## 2012-01-03 MED ORDER — ONDANSETRON HCL 4 MG/2ML IJ SOLN
INTRAMUSCULAR | Status: DC | PRN
  Administered 2012-01-03: 4 mg via INTRAVENOUS

## 2012-01-03 MED ORDER — OXYCODONE HCL 5 MG/5ML PO SOLN: 5.0000 mg | Freq: Once | ORAL | Status: AC | PRN

## 2012-01-03 MED ORDER — METOCLOPRAMIDE HCL 5 MG/ML IJ SOLN: 10.0000 mg | Freq: Once | INTRAMUSCULAR | Status: DC | PRN

## 2012-01-03 MED ORDER — DEXAMETHASONE SODIUM PHOSPHATE 4 MG/ML IJ SOLN
INTRAMUSCULAR | Status: DC | PRN
  Administered 2012-01-03: 8 mg via INTRAVENOUS

## 2012-01-03 MED ORDER — CEFAZOLIN SODIUM 1-5 GM-% IV SOLN
INTRAVENOUS | Status: DC | PRN
  Administered 2012-01-03: 2 g via INTRAVENOUS

## 2012-01-03 SURGICAL SUPPLY — 39 items
BLADE SURG 11 STRL SS (BLADE) ×2 IMPLANT
BLADE SURG 15 STRL LF DISP TIS (BLADE) IMPLANT
BLADE SURG 15 STRL SS (BLADE)
CANISTER SUCTION 1200CC (MISCELLANEOUS) ×2 IMPLANT
CLEANER CAUTERY TIP 5X5 PAD (MISCELLANEOUS) IMPLANT
CLOTH BEACON ORANGE TIMEOUT ST (SAFETY) ×2 IMPLANT
DECANTER SPIKE VIAL GLASS SM (MISCELLANEOUS) ×2 IMPLANT
DRAPE UTILITY XL STRL (DRAPES) ×2 IMPLANT
DRSG EMULSION OIL 3X3 NADH (GAUZE/BANDAGES/DRESSINGS) IMPLANT
DRSG PAD ABDOMINAL 8X10 ST (GAUZE/BANDAGES/DRESSINGS) ×2 IMPLANT
ELECT REM PT RETURN 9FT ADLT (ELECTROSURGICAL) ×2
ELECTRODE REM PT RTRN 9FT ADLT (ELECTROSURGICAL) IMPLANT
GAUZE SPONGE 4X4 12PLY STRL LF (GAUZE/BANDAGES/DRESSINGS) ×4 IMPLANT
GAUZE VASELINE 1X8 (GAUZE/BANDAGES/DRESSINGS) ×2 IMPLANT
GLOVE BIO SURGEON STRL SZ7 (GLOVE) ×1 IMPLANT
GLOVE BIO SURGEON STRL SZ8 (GLOVE) ×2 IMPLANT
GLOVE BIOGEL PI IND STRL 8 (GLOVE) ×1 IMPLANT
GLOVE BIOGEL PI INDICATOR 8 (GLOVE) ×1
GOWN PREVENTION PLUS XLARGE (GOWN DISPOSABLE) ×1 IMPLANT
GOWN PREVENTION PLUS XXLARGE (GOWN DISPOSABLE) ×3 IMPLANT
NEEDLE HYPO 22GX1.5 SAFETY (NEEDLE) ×2 IMPLANT
PACK BASIN DAY SURGERY FS (CUSTOM PROCEDURE TRAY) ×2 IMPLANT
PACK LITHOTOMY IV (CUSTOM PROCEDURE TRAY) ×2 IMPLANT
PAD CLEANER CAUTERY TIP 5X5 (MISCELLANEOUS)
PENCIL BUTTON HOLSTER BLD 10FT (ELECTRODE) ×1 IMPLANT
SPONGE SURGIFOAM ABS GEL 100 (HEMOSTASIS) IMPLANT
SURGILUBE 2OZ TUBE FLIPTOP (MISCELLANEOUS) ×5 IMPLANT
SUT CHROMIC 3 0 SH 27 (SUTURE) IMPLANT
SUT VIC AB 3-0 SH 27 (SUTURE) ×2
SUT VIC AB 3-0 SH 27X BRD (SUTURE) IMPLANT
SYR CONTROL 10ML LL (SYRINGE) ×2 IMPLANT
TOWEL OR 17X24 6PK STRL BLUE (TOWEL DISPOSABLE) ×3 IMPLANT
TOWEL OR NON WOVEN STRL DISP B (DISPOSABLE) ×2 IMPLANT
TRAY DSU PREP LF (CUSTOM PROCEDURE TRAY) ×2 IMPLANT
TRAY PROCTOSCOPIC FIBER OPTIC (SET/KITS/TRAYS/PACK) IMPLANT
TUBE CONNECTING 20X1/4 (TUBING) ×2 IMPLANT
UNDERPAD 30X30 INCONTINENT (UNDERPADS AND DIAPERS) ×2 IMPLANT
WATER STERILE IRR 1000ML POUR (IV SOLUTION) ×1 IMPLANT
YANKAUER SUCT BULB TIP NO VENT (SUCTIONS) ×2 IMPLANT

## 2012-01-03 NOTE — Op Note (Signed)
01/03/2012  8:03 AM  PATIENT:  [name]  77 y.o. male  PRE-OPERATIVE DIAGNOSIS:  condyloma anal  POST-OPERATIVE DIAGNOSIS:  condyloma anal  PROCEDURE:  Procedure(s): EXCISION AND FULGURATION ANAL WART EXAMINATION UNDER ANESTHESIA  SURGEON:  Surgeon(s): Liz Malady, MD  PHYSICIAN ASSISTANT:   ASSISTANTS: none   ANESTHESIA:   local and general  EBL:     BLOOD ADMINISTERED:none  DRAINS: none   SPECIMEN:  Excision  DISPOSITION OF SPECIMEN:  PATHOLOGY  COUNTS:  YES  DICTATION: .Dragon DictationPatient presents for excision of anal condylomata. He was identified in the preop holding area. He received intravenous antibiotics. Informed consent was obtained. He was brought to the operating room. General anesthesia with laryngeal mask airway was administered by the anesthesia staff. He was placed in lithotomy position. Perianal area was prepped and draped in sterile fashion.Time out procedure was done. 1/2% Marcaine with epinephrine was injected for perianal block and subcutaneously for postoperative pain relief. First, examination was performed. There were no significant intra-anal or rectal condylomata. Disease was localized to the lower half with the addition of a large 1 cm mass at the 3:00 position.  Multiple condylomata were excised sharply including the large mass. Several other tiny ones were then fulgurated with cautery. The base of each excision was also cauterized additionally for hemostasis and control disease. Next, due to the large size of the wound excision, skin flaps were raised medially and laterally and it was closed loosely with subcuticular Vicryl in an interrupted fashion. Further inspection revealed no residual disease. There was good hemostasis. Counts were correct. Antibiotic ointment and sterile dressings were applied. Patient tolerated procedure well without apparent complication and was taken recovery in stable condition.  PATIENT DISPOSITION:   PACU - hemodynamically stable.   Delay start of Pharmacological VTE agent (>24hrs) due to surgical blood loss or risk of bleeding:  no  Violeta Gelinas, MD, MPH, FACS Pager: [phone]  7/22/20138:03 AM

## 2012-01-03 NOTE — H&P (Signed)
HPI  [name] is a 77 y.o. male.  HPIThis patient previously for perianal condylomata. He presents for followup. He has had no significant change in symptoms but is very concerned about the lesions. Bowel function has been normal. Anal pruritus has improved.  Past Medical History   Diagnosis  Date   .  Hemorrhoids    .  Hernia    .  GERD (gastroesophageal reflux disease)    .  Hyperlipemia    .  Hypertension    .  History of BPH    .  Diet-controlled type 2 diabetes mellitus    .  Mitral regurgitation      status post mitral valve repair at [facility] in [place]   .  CVA (cerebral infarction)      Significant carotid artery disease status post right carotid endarterectomy, postoperatively   .  Depression    .  Status post coronary artery bypass grafting      Single-vessel coronary bypass grafting at time of mitral valve repair   .  Heart murmur    .  Thyroid disease      hypothyroidism   .  Stroke     Past Surgical History   Procedure  Date   .  Cholecystectomy    .  Coronary artery bypass graft    .  Mitral valve repair    .  Carotid endarterectomy      right   .  Turp vaporization    .  Cataract extraction    .  Nevus removed    .  Ercp      with biliary and pancreatic stenting   .  Inguinal hernia repair  01/19/2011     Bilateral    History reviewed. No pertinent family history.  Social History  History   Substance Use Topics   .  Smoking status:  Never Smoker   .  Smokeless tobacco:  Not on file   .  Alcohol Use:  No    No Known Allergies  Current Outpatient Prescriptions   Medication  Sig  Dispense  Refill   .  aspirin 325 MG tablet  Take 325 mg by mouth daily.     Marland Kitchen  docusate sodium (COLACE) 100 MG capsule  Take 100 mg by mouth as needed.     Marland Kitchen  levothyroxine (LEVOTHROID) 50 MCG tablet  Take 50 mcg by mouth daily.     .  ranitidine (ZANTAC) 150 MG tablet  Take 150 mg by mouth 2 (two) times daily.     .  rosuvastatin (CRESTOR) 5  MG tablet  Take 5 mg by mouth daily.     .  Tamsulosin HCl (FLOMAX) 0.4 MG CAPS  Take 0.4 mg by mouth.     .  vitamin B-12 (CYANOCOBALAMIN) 500 MCG tablet  Take 500 mcg by mouth daily.      Review of Systems  Review of Systems  Blood pressure 148/70, pulse 72, temperature 97.4 F (36.3 C), temperature source Temporal, resp. rate 16, height 5\' 6"  (1.676 m), weight 148 lb 6.4 oz (67.314 kg).  Physical Exam  Physical Exam  HENT:  Head: Normocephalic and atraumatic.  Eyes: Pupils are equal, round, and reactive to light.  Neck: Normal range of motion. No tracheal deviation present.  Cardiovascular: Normal rate, regular rhythm, normal heart sounds and intact distal pulses.  Pulmonary/Chest: Effort normal and breath sounds normal. No respiratory distress. He has no wheezes.  He has no rales.  Abdominal: Soft. He exhibits no distension. There is no tenderness. There is no rebound.  Perianal region reveals condylomata in the posterior midline, some small intra-anal condylomata, and a larger cluster together on the left side   Data Reviewed  Office notes from Dr. Sherril Croon  Assessment   Perianal condylomata  Plan   I have offered excision under anesthesia as a outpatient procedure. The patient is concerned about a small risk of is becoming cancer. Procedure, risks, and benefits were discussed in detail. He is agreeable.     In addition the morninig of surgery we discussed some ongoing pain at [facility] site.  No bulge.  On exam, LIH repair is intact but tender medially.  Likely inflammation of iinguinal branch of ileoinguinal nerve.  Will try Lyrica.  Violeta Gelinas, MD, MPH, FACS Pager: [phone]

## 2012-01-03 NOTE — Anesthesia Procedure Notes (Signed)
Procedure Name: LMA Insertion Date/Time: 01/03/2012 7:39 AM Performed by: Zenia Resides D Pre-anesthesia Checklist: Patient identified, Emergency Drugs available, Suction available, Patient being monitored and Timeout performed Patient Re-evaluated:Patient Re-evaluated prior to inductionOxygen Delivery Method: Circle System Utilized Preoxygenation: Pre-oxygenation with 100% oxygen Intubation Type: IV induction Ventilation: Mask ventilation without difficulty LMA: LMA inserted LMA Size: 4.0 Number of attempts: 1 Airway Equipment and Method: bite block Placement Confirmation: positive ETCO2 and breath sounds checked- equal and bilateral Tube secured with: Tape Dental Injury: Teeth and Oropharynx as per pre-operative assessment

## 2012-01-03 NOTE — Anesthesia Preprocedure Evaluation (Signed)
Anesthesia Evaluation  Patient identified by MRN, date of birth, ID band Patient awake    Reviewed: Allergy & Precautions, H&P , NPO status , Patient's Chart, lab work & pertinent test results, reviewed documented beta blocker date and time   Airway Mallampati: II TM Distance: >3 FB Neck ROM: full    Dental   Pulmonary neg pulmonary ROS,  breath sounds clear to auscultation        Cardiovascular hypertension, Pt. on medications + CAD and + CABG + Valvular Problems/Murmurs MR Rhythm:regular     Neuro/Psych PSYCHIATRIC DISORDERS Depression CVA, No Residual Symptoms    GI/Hepatic Neg liver ROS, GERD-  Medicated and Controlled,  Endo/Other    Renal/GU negative Renal ROS  negative genitourinary   Musculoskeletal   Abdominal   Peds  Hematology negative hematology ROS (+)   Anesthesia Other Findings See surgeon's H&P   Reproductive/Obstetrics negative OB ROS                           Anesthesia Physical Anesthesia Plan  ASA: III  Anesthesia Plan: General   Post-op Pain Management:    Induction: Intravenous  Airway Management Planned: LMA  Additional Equipment:   Intra-op Plan:   Post-operative Plan: Extubation in OR  Informed Consent: I have reviewed the patients History and Physical, chart, labs and discussed the procedure including the risks, benefits and alternatives for the proposed anesthesia with the patient or authorized representative who has indicated his/her understanding and acceptance.   Dental Advisory Given  Plan Discussed with: CRNA and Surgeon  Anesthesia Plan Comments:         Anesthesia Quick Evaluation

## 2012-01-03 NOTE — Transfer of Care (Signed)
Immediate Anesthesia Transfer of Care Note  Patient: [name]  Procedure(s) Performed: Procedure(s) (LRB): FULGURATION ANAL WART (N/A)  Patient Location: PACU  Anesthesia Type: General  Level of Consciousness: awake, alert  and oriented  Airway & Oxygen Therapy: Patient Spontanous Breathing and Patient connected to face mask oxygen  Post-op Assessment: Report given to PACU RN and Post -op Vital signs reviewed and stable  Post vital signs: Reviewed and stable  Complications: No apparent anesthesia complications

## 2012-01-03 NOTE — Interval H&P Note (Signed)
History and Physical Interval Note:  01/03/2012 7:24 AM  [name]  has presented today for surgery, with the diagnosis of condyloma  The various methods of treatment have been discussed with the patient and family. After consideration of risks, benefits and other options for treatment, the patient has consented to  Procedure(s) (LRB): FULGURATION ANAL WART (N/A) as a surgical intervention .  The patient's history has been reviewed, patient re-examined, no change in status, stable for surgery.  I have reviewed the patient's chart and labs.  Questions were answered to the patient's satisfaction.     [name]

## 2012-01-03 NOTE — Anesthesia Postprocedure Evaluation (Signed)
Anesthesia Post Note  Patient: [name]  Procedure(s) Performed: Procedure(s) (LRB): FULGURATION ANAL WART (N/A)  Anesthesia type: General  Patient location: PACU  Post pain: Pain level controlled  Post assessment: Patient's Cardiovascular Status Stable  Last Vitals:  Filed Vitals:   01/03/12 0905  BP: 146/67  Pulse: 77  Temp: 36.5 C  Resp: 16    Post vital signs: Reviewed and stable  Level of consciousness: alert  Complications: No apparent anesthesia complications

## 2012-01-04 ENCOUNTER — Encounter (HOSPITAL_BASED_OUTPATIENT_CLINIC_OR_DEPARTMENT_OTHER): Payer: Self-pay | Admitting: General Surgery

## 2012-01-10 ENCOUNTER — Telehealth (INDEPENDENT_AMBULATORY_CARE_PROVIDER_SITE_OTHER): Payer: Self-pay

## 2012-01-10 NOTE — Telephone Encounter (Signed)
The patient called and states his incision has opened and it's bleeding.  There were sutures but he doesn't know what happened.  I paged Dr Janee Morn and he advised to keep a dry gauze dressing on it and change it daily.  I notified the pt and asked him to call me if it gets worse, drains pus or he has fever.

## 2012-01-18 ENCOUNTER — Encounter: Payer: Self-pay | Admitting: Cardiology

## 2012-01-18 ENCOUNTER — Ambulatory Visit (INDEPENDENT_AMBULATORY_CARE_PROVIDER_SITE_OTHER): Payer: Medicare Other | Admitting: Cardiology

## 2012-01-18 VITALS — BP 154/65 | HR 53 | Ht 66.0 in | Wt 149.8 lb

## 2012-01-18 DIAGNOSIS — I340 Nonrheumatic mitral (valve) insufficiency: Secondary | ICD-10-CM

## 2012-01-18 DIAGNOSIS — A630 Anogenital (venereal) warts: Secondary | ICD-10-CM

## 2012-01-18 DIAGNOSIS — F329 Major depressive disorder, single episode, unspecified: Secondary | ICD-10-CM

## 2012-01-18 DIAGNOSIS — F3289 Other specified depressive episodes: Secondary | ICD-10-CM

## 2012-01-18 DIAGNOSIS — I6529 Occlusion and stenosis of unspecified carotid artery: Secondary | ICD-10-CM

## 2012-01-18 DIAGNOSIS — I059 Rheumatic mitral valve disease, unspecified: Secondary | ICD-10-CM

## 2012-01-18 DIAGNOSIS — Z951 Presence of aortocoronary bypass graft: Secondary | ICD-10-CM

## 2012-01-18 NOTE — Progress Notes (Signed)
Peyton Bottoms, MD, [facility]IM Board Certified in Adult Cardiovascular Medicine,Internal Medicine and Critical Care Medicine    CC:  Status post mitral valve repair and single-vessel coronary bypass grafting                                                                                HPI:  The patient is a 77 year old [place] male with history of peripheral vascular disease, status post stroke requiring right carotid endarterectomy. The patient is status post mitral valve repair and single-vessel bypass grafting. He reports no recurrent chest pain short of breath orthopnea PND. He reports no palpitations or syncope. Is doing remarkably well from a cardiac perspective. Patient has an abnormal EKG was unchanged from prior. The patient is scheduled for minor surgery and has no significant contraindications.       PMH: reviewed and listed in Problem List in Electronic Records (and see below) Past Medical History  Diagnosis Date  . Hemorrhoids   . Hernia   . GERD (gastroesophageal reflux disease)   . Hyperlipemia   . History of BPH   . Diet-controlled type 2 diabetes mellitus   . Mitral regurgitation      status post mitral valve repair at [facility] in [place]  . CVA (cerebral infarction)     Significant carotid artery disease status post right carotid endarterectomy, postoperatively  . Depression   . Status post coronary artery bypass grafting     Single-vessel coronary bypass grafting at time of mitral valve repair  . Thyroid disease     hypothyroidism  . Stroke Oct 2010  . Hypertension   . Heart murmur    Past Surgical History  Procedure Date  . Cholecystectomy   . Coronary artery bypass graft   . Mitral valve repair April 2010  . Carotid endarterectomy     right  . Turp vaporization   . Cataract extraction   . Nevus removed   . Ercp     with biliary and pancreatic stenting  . Inguinal hernia repair 01/19/2011    Bilateral  . Wart fulguration 01/03/2012     Procedure: FULGURATION ANAL WART;  Surgeon: Liz Malady, MD;  Location: [facility];  Service: General;  Laterality: N/A;  excision perianal condylomata    Allergies/SH/FHX : available in Electronic Records for review  No Known Allergies History   Social History  . Marital Status: Married    Spouse Name: N/A    Number of Children: N/A  . Years of Education: N/A   Occupational History  . Not on file.   Social History Main Topics  . Smoking status: Never Smoker   . Smokeless tobacco: Not on file  . Alcohol Use: No  . Drug Use: No  . Sexually Active: Not on file   Other Topics Concern  . Not on file   Social History Narrative  . No narrative on file   No family history on file.  Medications: Current Outpatient Prescriptions  Medication Sig Dispense Refill  . aspirin 325 MG tablet Take 325 mg by mouth daily.        Marland Kitchen docusate sodium (COLACE) 100 MG  capsule Take 100 mg by mouth as needed.       Marland Kitchen levothyroxine (LEVOTHROID) 50 MCG tablet Take 50 mcg by mouth daily.        . pregabalin (LYRICA) 75 MG capsule Take 1 capsule (75 mg total) by mouth 2 (two) times daily.  60 capsule  1  . ranitidine (ZANTAC) 150 MG tablet Take 150 mg by mouth 2 (two) times daily.        . rosuvastatin (CRESTOR) 5 MG tablet Take 5 mg by mouth daily.        . Tamsulosin HCl (FLOMAX) 0.4 MG CAPS Take 0.4 mg by mouth.        . vitamin B-12 (CYANOCOBALAMIN) 500 MCG tablet Take 500 mcg by mouth daily.          ROS: No nausea or vomiting. No fever or chills.No melena or hematochezia.No bleeding.No claudication  Physical Exam: BP 154/65  Pulse 53  Ht 5\' 6"  (1.676 m)  Wt 149 lb 12.8 oz (67.949 kg)  BMI 24.18 kg/m2 General: Well-nourished [place] male in no distress. Neck: Normal carotid upstroke bilaterally, status post right carotid endarterectomy scar with soft right carotid bruit and also soft left carotid bruit. JVP is 6-7 cm Lungs: Clear breath sounds bilaterally without  wheezing Cardiac: Regular rate and rhythm with normal S1-S2 no pathological murmurs no evidence of recurrent mitral regurgitation by exam Vascular: No edema. Normal distal pulses Skin: Warm and dry Physcologic: Normal affect  12lead ECG: Normal sinus rhythm. Right bundle branch block old inferior infarct pattern. Limited bedside ECHO:N/A No images are attached to the encounter.   I reviewed and summarized the old records. I reviewed ECG and prior blood work.  Assessment and Plan  CAROTID ARTERY DISEASE Status post right carotid endarterectomy scar. Recently evaluated by vascular surgery. Stable Dopplers  Condyloma acuminatum of perianal region Patient scheduled for minor surgery. No contraindications from vascular perspective.  DEPRESSION Resolved.  Mitral regurgitation No clinical evidence of recurrent mitral regurgitation. No pathological murmurs. No heart failure symptoms. Consider echocardiogram in one year  Status post coronary artery bypass grafting No chest pain. No ischemia workup required.    Patient Active Problem List  Diagnosis  . DEPRESSION  . CAROTID ARTERY DISEASE  . FATIGUE  . DYSPHAGIA OROPHARYNGEAL PHASE  . Mitral regurgitation  . CVA (cerebral infarction)  . Status post coronary artery bypass grafting  . Hernia  . Condyloma acuminatum of perianal region

## 2012-01-18 NOTE — Assessment & Plan Note (Signed)
No chest pain. No ischemia workup required.

## 2012-01-18 NOTE — Assessment & Plan Note (Signed)
Patient scheduled for minor surgery. No contraindications from vascular perspective.

## 2012-01-18 NOTE — Assessment & Plan Note (Signed)
Status post right carotid endarterectomy scar. Recently evaluated by vascular surgery. Stable Dopplers

## 2012-01-18 NOTE — Assessment & Plan Note (Signed)
Resolved

## 2012-01-18 NOTE — Assessment & Plan Note (Signed)
No clinical evidence of recurrent mitral regurgitation. No pathological murmurs. No heart failure symptoms. Consider echocardiogram in one year

## 2012-01-18 NOTE — Patient Instructions (Signed)
Continue all current medications. Your physician wants you to follow up in:  1 year.  You will receive a reminder letter in the mail one-two months in advance.  If you don't receive a letter, please call our office to schedule the follow up appointment   

## 2012-01-26 ENCOUNTER — Ambulatory Visit (INDEPENDENT_AMBULATORY_CARE_PROVIDER_SITE_OTHER): Payer: Medicare Other | Admitting: General Surgery

## 2012-01-26 ENCOUNTER — Encounter (INDEPENDENT_AMBULATORY_CARE_PROVIDER_SITE_OTHER): Payer: Self-pay | Admitting: General Surgery

## 2012-01-26 VITALS — BP 127/64 | HR 80 | Temp 98.8°F | Resp 18 | Ht 66.0 in | Wt 150.4 lb

## 2012-01-26 DIAGNOSIS — A630 Anogenital (venereal) warts: Secondary | ICD-10-CM

## 2012-01-26 NOTE — Progress Notes (Signed)
Subjective:     Patient ID: [name], male   DOB: 1935/05/12, 77 y.o.   MRN: [id number]  HPI Patient presents status post excision of perianal condylomata. The wound of the large excision opened up postoperatively. He is been placing some antibiotic ointment. He is still having some burning and occasional bleeding but is overall better. Additionally, he is taking Lyrica 50 mg at at bedtime for inguinal pain status post previous inguinal hernia. He has had some improvement with that since starting it 2 weeks ago.  Review of Systems     Objective:   Physical Exam Perianal region reveals open wound on the left side from larger condyloma excision. A Vicryl suture was removed from the site. There is no evidence of infection. Granulation tissue is clear. There is also one small opening near the anal verge on the right side. Other areas are all healed.    Assessment:     Improving status post excision of perianal condylomata, some improvement in inguinal pain on Lyrica    Plan:     Local wound care. Continue Lyrica. Return in 3 weeks.

## 2012-02-16 ENCOUNTER — Encounter (INDEPENDENT_AMBULATORY_CARE_PROVIDER_SITE_OTHER): Payer: Self-pay | Admitting: General Surgery

## 2012-02-16 ENCOUNTER — Ambulatory Visit (INDEPENDENT_AMBULATORY_CARE_PROVIDER_SITE_OTHER): Payer: Medicare Other | Admitting: General Surgery

## 2012-02-16 VITALS — BP 121/68 | HR 82 | Temp 97.5°F | Resp 18 | Ht 66.0 in | Wt 150.0 lb

## 2012-02-16 DIAGNOSIS — K469 Unspecified abdominal hernia without obstruction or gangrene: Secondary | ICD-10-CM

## 2012-02-16 DIAGNOSIS — A630 Anogenital (venereal) warts: Secondary | ICD-10-CM

## 2012-02-16 MED ORDER — PREGABALIN 50 MG PO CAPS: 50.0000 mg | ORAL_CAPSULE | Freq: Every day | ORAL | Status: AC

## 2012-02-16 NOTE — Progress Notes (Signed)
Subjective:     Patient ID: [name], male   DOB: 10/24/1934, 77 y.o.   MRN: [id number]  HPI  Patient presents status post excision of perianal condyloma. He is also here for followup of nerve-related pain after left inguinal hernia repair. He is doing better. He is taking Lyrica 50 mg a day. That has significantly helped his hernia pain. He feels his anal area has healed. Review of Systems     Objective:   Physical Exam Anal area demonstrates complete filling of all excision sites. No signs of infection.    Assessment:     Doing well status post excision of anal condyloma. Lyrica is helping hernia pain.    Plan:     Continue Lyrica 50 mg daily. I gave him a prescription to cover the time he will be traveling out of the country. He will give me a call to make an appointment in 4 months when he returns from [place].

## 2012-12-07 ENCOUNTER — Other Ambulatory Visit (INDEPENDENT_AMBULATORY_CARE_PROVIDER_SITE_OTHER): Payer: Medicare Other | Admitting: *Deleted

## 2012-12-07 DIAGNOSIS — I6529 Occlusion and stenosis of unspecified carotid artery: Secondary | ICD-10-CM

## 2012-12-07 DIAGNOSIS — Z48812 Encounter for surgical aftercare following surgery on the circulatory system: Secondary | ICD-10-CM

## 2012-12-08 ENCOUNTER — Ambulatory Visit: Payer: Medicare Other | Admitting: Neurosurgery

## 2012-12-08 ENCOUNTER — Other Ambulatory Visit: Payer: Medicare Other

## 2012-12-11 ENCOUNTER — Other Ambulatory Visit: Payer: Self-pay | Admitting: *Deleted

## 2012-12-11 DIAGNOSIS — Z48812 Encounter for surgical aftercare following surgery on the circulatory system: Secondary | ICD-10-CM

## 2012-12-14 ENCOUNTER — Encounter: Payer: Self-pay | Admitting: Vascular Surgery

## 2013-03-19 ENCOUNTER — Encounter (INDEPENDENT_AMBULATORY_CARE_PROVIDER_SITE_OTHER): Payer: Self-pay | Admitting: *Deleted

## 2013-03-29 ENCOUNTER — Encounter (INDEPENDENT_AMBULATORY_CARE_PROVIDER_SITE_OTHER): Payer: Self-pay | Admitting: *Deleted

## 2013-03-29 ENCOUNTER — Telehealth (INDEPENDENT_AMBULATORY_CARE_PROVIDER_SITE_OTHER): Payer: Self-pay | Admitting: *Deleted

## 2013-03-29 ENCOUNTER — Other Ambulatory Visit (INDEPENDENT_AMBULATORY_CARE_PROVIDER_SITE_OTHER): Payer: Self-pay | Admitting: *Deleted

## 2013-03-29 DIAGNOSIS — Z1211 Encounter for screening for malignant neoplasm of colon: Secondary | ICD-10-CM

## 2013-03-29 NOTE — Telephone Encounter (Signed)
agree

## 2013-03-29 NOTE — Telephone Encounter (Signed)
Patient needs movi prep 

## 2013-03-29 NOTE — Telephone Encounter (Signed)
  Procedure: tcs  Reason/Indication:  screening  Has patient had this procedure before?  Yes, 10 years or more ago  If so, when, by whom and where?    Is there a family history of colon cancer?  no  Who?  What age when diagnosed?    Is patient diabetic?   no      Does patient have prosthetic heart valve?  no  Do you have a pacemaker?  no  Has patient ever had endocarditis? no  Has patient had joint replacement within last 12 months?  no  Does patient tend to be constipated or take laxatives? no  Is patient on Coumadin, Plavix and/or Aspirin? yes  Medications: asa 81 mg bid, metamucil prn, vit b12 500 mg daily, levothyroxine 50 mcg daily, crestor 5 mg daily, rapaflo 8 mg daily, ranitidine 150 mg bid, multi vit, lyrica 50 mg daily  Allergies: nkda  Medication Adjustment: asa 2 days  Procedure date & time: 04/25/13 at 1200

## 2013-03-30 MED ORDER — PEG-KCL-NACL-NASULF-NA ASC-C 100 G PO SOLR: 1.0000 | Freq: Once | ORAL | Status: DC

## 2013-05-22 ENCOUNTER — Encounter (HOSPITAL_COMMUNITY): Payer: Self-pay | Admitting: Pharmacy Technician

## 2013-05-29 ENCOUNTER — Encounter (HOSPITAL_COMMUNITY): Payer: Self-pay | Admitting: *Deleted

## 2013-05-30 ENCOUNTER — Ambulatory Visit (HOSPITAL_COMMUNITY)
Admission: RE | Admit: 2013-05-30 | Discharge: 2013-05-30 | Disposition: A | Discharge status: Home / Self Care | Payer: Medicare Other | Source: Ambulatory Visit | Attending: Internal Medicine | Admitting: Internal Medicine

## 2013-05-30 ENCOUNTER — Encounter (HOSPITAL_COMMUNITY)
Admission: RE | Disposition: A | Discharge status: Home / Self Care | Payer: Self-pay | Source: Ambulatory Visit | Attending: Internal Medicine

## 2013-05-30 ENCOUNTER — Encounter (HOSPITAL_COMMUNITY): Payer: Self-pay | Admitting: *Deleted

## 2013-05-30 DIAGNOSIS — Z7982 Long term (current) use of aspirin: Secondary | ICD-10-CM | POA: Insufficient documentation

## 2013-05-30 DIAGNOSIS — Z1211 Encounter for screening for malignant neoplasm of colon: Secondary | ICD-10-CM

## 2013-05-30 DIAGNOSIS — K644 Residual hemorrhoidal skin tags: Secondary | ICD-10-CM | POA: Insufficient documentation

## 2013-05-30 DIAGNOSIS — I10 Essential (primary) hypertension: Secondary | ICD-10-CM

## 2013-05-30 DIAGNOSIS — Z79899 Other long term (current) drug therapy: Secondary | ICD-10-CM | POA: Insufficient documentation

## 2013-05-30 HISTORY — PX: COLONOSCOPY: SHX5424

## 2013-05-30 HISTORY — DX: Hypothyroidism, unspecified: E03.9

## 2013-05-30 SURGERY — COLONOSCOPY
Anesthesia: Moderate Sedation

## 2013-05-30 MED ORDER — MEPERIDINE HCL 50 MG/ML IJ SOLN
INTRAMUSCULAR | Status: DC | PRN
  Administered 2013-05-30: 10 mg via INTRAVENOUS
  Administered 2013-05-30 (×2): 20 mg via INTRAVENOUS

## 2013-05-30 MED ORDER — SODIUM CHLORIDE 0.9 % IV SOLN
INTRAVENOUS | Status: DC
  Administered 2013-05-30: 11:00:00 via INTRAVENOUS

## 2013-05-30 MED ORDER — STERILE WATER FOR IRRIGATION IR SOLN
Status: DC | PRN
  Administered 2013-05-30: 11:00:00

## 2013-05-30 MED ORDER — MEPERIDINE HCL 50 MG/ML IJ SOLN
INTRAMUSCULAR | Status: AC
  Filled 2013-05-30: qty 1

## 2013-05-30 MED ORDER — MIDAZOLAM HCL 5 MG/5ML IJ SOLN
INTRAMUSCULAR | Status: DC | PRN
  Administered 2013-05-30: 2 mg via INTRAVENOUS
  Administered 2013-05-30 (×2): 1 mg via INTRAVENOUS
  Administered 2013-05-30: 2 mg via INTRAVENOUS

## 2013-05-30 MED ORDER — MIDAZOLAM HCL 5 MG/5ML IJ SOLN
INTRAMUSCULAR | Status: AC
  Filled 2013-05-30: qty 10

## 2013-05-30 NOTE — H&P (Addendum)
[name] is an 78 y.o. male.   Chief Complaint: Patient is here for colonoscopy. HPI: Patient is 78 year old male who is here for screening colonoscopy. He denies abdominal pain change in his bowel habits or rectal bleeding. His last exam was over 10 years ago. Family history negative for CRC. Patient has had repair of the mitral valve. There is no history of SBE.  Past Medical History  Diagnosis Date  . Hemorrhoids   . Hernia   . GERD (gastroesophageal reflux disease)   . Hyperlipemia   . History of BPH   . Mitral regurgitation      status post mitral valve repair at [facility] in [place]  . CVA (cerebral infarction)     Significant carotid artery disease status post right carotid endarterectomy, postoperatively  . Depression   . Status post coronary artery bypass grafting     Single-vessel coronary bypass grafting at time of mitral valve repair  . Thyroid disease     hypothyroidism  . Stroke Oct 2010  . Hypertension   . Heart murmur   . Hypothyroidism     Past Surgical History  Procedure Laterality Date  . Cholecystectomy    . Coronary artery bypass graft    . Mitral valve repair  April 2010  . Carotid endarterectomy      right  . Turp vaporization    . Cataract extraction    . Nevus removed    . Ercp      with biliary and pancreatic stenting  . Inguinal hernia repair  01/19/2011    Bilateral  . Wart fulguration  01/03/2012    Procedure: FULGURATION ANAL WART;  Surgeon: Liz Malady, MD;  Location: [facility];  Service: General;  Laterality: N/A;  excision perianal condylomata  . Mitral valve repair  2010    Family History  Problem Relation Age of Onset  . Colon cancer Neg Hx    Social History:  reports that he has never smoked. He does not have any smokeless tobacco history on file. He reports that he does not drink alcohol or use illicit drugs.  Allergies: No Known Allergies  Medications Prior to Admission   Medication Sig Dispense Refill  . aspirin EC 81 MG tablet Take 81 mg by mouth daily.      Marland Kitchen latanoprost (XALATAN) 0.005 % ophthalmic solution Place 1 drop into both eyes at bedtime.      Marland Kitchen levothyroxine (LEVOTHROID) 50 MCG tablet Take 50 mcg by mouth daily.        Marland Kitchen lisinopril (PRINIVIL,ZESTRIL) 10 MG tablet Take 5 mg by mouth daily.      . peg 3350 powder (MOVIPREP) 100 G SOLR Take 1 kit (200 g total) by mouth once.  1 kit  0  . pregabalin (LYRICA) 50 MG capsule Take 50 mg by mouth daily.      . ranitidine (ZANTAC) 150 MG tablet Take 150 mg by mouth 2 (two) times daily.        . rosuvastatin (CRESTOR) 5 MG tablet Take 5 mg by mouth daily.        . silodosin (RAPAFLO) 8 MG CAPS capsule Take 8 mg by mouth daily with breakfast.      . vitamin B-12 (CYANOCOBALAMIN) 500 MCG tablet Take 500 mcg by mouth daily.          No results found for this or any previous visit (from the past 48 hour(s)). No results found.  ROS  Blood pressure 175/70, pulse 82, temperature 98.2 F (36.8 C), temperature source Oral, resp. rate 22, height 5\' 6"  (1.676 m), weight 150 lb (68.04 kg), SpO2 100.00%. Physical Exam  Constitutional: He appears well-developed and well-nourished.  HENT:  Mouth/Throat: Oropharynx is clear and moist.  Eyes: Conjunctivae are normal. No scleral icterus.  Neck: No thyromegaly present.  Cardiovascular: Normal rate and regular rhythm.   Murmur: faint systolic ejection murmur best heard at AA. Respiratory: Effort normal and breath sounds normal.  GI: Soft. He exhibits no distension and no mass. There is no tenderness.  Musculoskeletal: He exhibits no edema.  Lymphadenopathy:    He has no cervical adenopathy.  Neurological: He is alert.  Skin: Skin is warm and dry.     Assessment/Plan Average risk screening colonoscopy.  , U 05/30/2013, 11:23 AM

## 2013-05-30 NOTE — Op Note (Signed)
COLONOSCOPY PROCEDURE REPORT  PATIENT:  [name]  MR#:  [id number] Birthdate:  1934/08/25, 78 y.o., male Endoscopist:  Dr. Malissa Hippo, MD Referred By:  Dr. Ignatius Specking MD Procedure Date: 05/30/2013  Procedure:   Colonoscopy  Indications:  Patient is a 78 year old male who is undergoing average risk screening colonoscopy.  Informed Consent:  The procedure and risks were reviewed with the patient and informed consent was obtained.  Medications:  Demerol 50 mg IV Versed 6 mg IV  Description of procedure:  After a digital rectal exam was performed, that colonoscope was advanced from the anus through the rectum and colon to the area of the cecum, ileocecal valve and appendiceal orifice. The cecum was deeply intubated. These structures were well-seen and photographed for the record. From the level of the cecum and ileocecal valve, the scope was slowly and cautiously withdrawn. The mucosal surfaces were carefully surveyed utilizing scope tip to flexion to facilitate fold flattening as needed. The scope was pulled down into the rectum where a thorough exam including retroflexion was performed. Terminal ileum was also examined.  Findings:   Prep excellent. Normal mucosa of terminal ileum. Normal mucosa of the areas segments of colon. Normal rectal mucosa. Small hemorrhoids below the dentate line.   Therapeutic/Diagnostic Maneuvers Performed:  None  Complications:  None  Cecal Withdrawal Time:  10 minutes  Impression:  Normal mucosa of terminal ileum. Normal colonoscopy except small external hemorrhoids.  Recommendations:  Standard instructions given.   Thedore Pickel U  05/30/2013 11:56 AM  CC: Dr. Ignatius Specking., MD & Dr. Bonnetta Barry ref. provider found

## 2013-06-01 ENCOUNTER — Encounter (HOSPITAL_COMMUNITY): Payer: Self-pay | Admitting: Internal Medicine

## 2013-07-30 ENCOUNTER — Other Ambulatory Visit: Payer: Self-pay | Admitting: Vascular Surgery

## 2013-07-30 DIAGNOSIS — Z48812 Encounter for surgical aftercare following surgery on the circulatory system: Secondary | ICD-10-CM

## 2013-07-30 DIAGNOSIS — I6529 Occlusion and stenosis of unspecified carotid artery: Secondary | ICD-10-CM

## 2013-12-18 ENCOUNTER — Other Ambulatory Visit (HOSPITAL_COMMUNITY): Payer: Medicare Other

## 2013-12-18 ENCOUNTER — Ambulatory Visit: Payer: Medicare Other | Admitting: Vascular Surgery

## 2014-01-09 ENCOUNTER — Encounter: Payer: Self-pay | Admitting: Family

## 2014-01-10 ENCOUNTER — Encounter: Payer: Self-pay | Admitting: Family

## 2014-01-10 ENCOUNTER — Ambulatory Visit (INDEPENDENT_AMBULATORY_CARE_PROVIDER_SITE_OTHER): Payer: Medicare Other | Admitting: Family

## 2014-01-10 ENCOUNTER — Ambulatory Visit (HOSPITAL_COMMUNITY)
Admission: RE | Admit: 2014-01-10 | Discharge: 2014-01-10 | Disposition: A | Discharge status: Home / Self Care | Payer: Medicare Other | Source: Ambulatory Visit | Attending: Family | Admitting: Family

## 2014-01-10 VITALS — BP 142/72 | HR 53 | Resp 14 | Ht 66.0 in | Wt 148.0 lb

## 2014-01-10 DIAGNOSIS — I6529 Occlusion and stenosis of unspecified carotid artery: Secondary | ICD-10-CM | POA: Insufficient documentation

## 2014-01-10 DIAGNOSIS — Z48812 Encounter for surgical aftercare following surgery on the circulatory system: Secondary | ICD-10-CM

## 2014-01-10 NOTE — Progress Notes (Signed)
Established Carotid Patient   History of Present Illness  [name] is a 79 y.o. male patient of Dr. Donnetta Hutching is who is status post a right CEA in November 2010 which was precluded by a CVA in October 2010. He returns today for follow up. He has had no stroke or TIA activity since 2010; at that time his stroke manifested as manifested as lack of dexterity in his left hand and expressive aphasia, denies monocular loss of vision.  His symptoms gradually improved over 3 months.   The patient denies New Medical or Surgical History.  Pt Diabetic: Yes Pt smoker: non-smoker  Pt meds include: Statin : Yes ASA: Yes Other anticoagulants/antiplatelets: no   Past Medical History  Diagnosis Date  . Hemorrhoids   . Hernia   . GERD (gastroesophageal reflux disease)   . Hyperlipemia   . History of BPH   . Mitral regurgitation      status post mitral valve repair at [facility] in [place]  . CVA (cerebral infarction)     Significant carotid artery disease status post right carotid endarterectomy, postoperatively  . Depression   . Status post coronary artery bypass grafting     Single-vessel coronary bypass grafting at time of mitral valve repair  . Thyroid disease     hypothyroidism  . Stroke Oct 2010  . Hypertension   . Heart murmur   . Hypothyroidism     Social History History  Substance Use Topics  . Smoking status: Never Smoker   . Smokeless tobacco: Never Used  . Alcohol Use: No    Family History Family History  Problem Relation Age of Onset  . Colon cancer Neg Hx   . Heart attack Father     Surgical History Past Surgical History  Procedure Laterality Date  . Cholecystectomy    . Coronary artery bypass graft    . Mitral valve repair  April 2010  . Carotid endarterectomy      right  . Turp vaporization    . Cataract extraction    . Nevus removed    . Ercp      with biliary and pancreatic stenting  . Inguinal hernia repair  01/19/2011   Bilateral  . Wart fulguration  01/03/2012    Procedure: FULGURATION ANAL WART;  Surgeon: Zenovia Jarred, MD;  Location: [place];  Service: General;  Laterality: N/A;  excision perianal condylomata  . Mitral valve repair  2010  . Colonoscopy N/A 05/30/2013    Procedure: COLONOSCOPY;  Surgeon: Rogene Houston, MD;  [facility];  Service: Endoscopy;  Laterality: N/A;  1230-rescheduled to 12:00 Ann notified pt    Allergies  Allergen Reactions  . Ciprofloxacin Anaphylaxis and Nausea And Vomiting    High dose    Current Outpatient Prescriptions  Medication Sig Dispense Refill  . aspirin EC 81 MG tablet Take 81 mg by mouth daily.      Marland Kitchen latanoprost (XALATAN) 0.005 % ophthalmic solution Place 1 drop into both eyes at bedtime.      Marland Kitchen levothyroxine (LEVOTHROID) 50 MCG tablet Take 50 mcg by mouth daily.        Marland Kitchen lisinopril (PRINIVIL,ZESTRIL) 10 MG tablet Take 5 mg by mouth daily. 10 mg      . prednisoLONE acetate (PRED FORTE) 1 % ophthalmic suspension       . pregabalin (LYRICA) 50 MG capsule Take 75 mg by mouth 2 (two) times daily.       Marland Kitchen  ranitidine (ZANTAC) 150 MG tablet Take 150 mg by mouth 2 (two) times daily.        . rosuvastatin (CRESTOR) 5 MG tablet Take 5 mg by mouth daily.        . silodosin (RAPAFLO) 8 MG CAPS capsule Take 8 mg by mouth daily with breakfast.      . vitamin B-12 (CYANOCOBALAMIN) 500 MCG tablet Take 500 mcg by mouth daily.         No current facility-administered medications for this visit.    Review of Systems : See HPI for pertinent positives and negatives.  Physical Examination  Filed Vitals:   01/10/14 1212 01/10/14 1215  BP: 151/66 142/72  Pulse: 53 53  Resp:  14  Height:  5\' 6"  (1.676 m)  Weight:  148 lb (67.132 kg)  SpO2:  100%   Body mass index is 23.9 kg/(m^2).  General: WDWN male in NAD GAIT: normal Eyes: PERRLA Pulmonary:  Non-labored, CTAB, Negative  Rales, Negative rhonchi, & Negative wheezing.  Cardiac:  regular Rhythm ,  Negative detected murmur.  VASCULAR EXAM Carotid Bruits Right Left   Negative Negative     Radial pulses are 2+ palpable and equal.                                                                                                                            LE Pulses Right Left       POPLITEAL  not palpable   not palpable       POSTERIOR TIBIAL   palpable    palpable        DORSALIS PEDIS      ANTERIOR TIBIAL  palpable   palpable     Gastrointestinal: soft, nontender, BS WNL, no r/g,  negative masses.  Musculoskeletal: Negative muscle atrophy/wasting. M/S 5/5 throughout, Extremities without ischemic changes.  Neurologic: A&O X 3; Appropriate Affect ; SENSATION ;normal;  Speech is normal CN 2-12 intact, Pain and light touch intact in extremities, Motor exam as listed above.   Non-Invasive Vascular Imaging CAROTID DUPLEX 01/10/2014   Right ICA: patent CEA site. Left ICA: <40% stenosis.  These findings are Unchanged from previous exam.   Assessment: LORENCE NAGENGAST is a 79 y.o. male who is status post a right CEA in November 2010 which was precluded by a CVA in October 2010. He has had no stroke or TIA activity since 2010. Today's carotid Duplex demonstrates a patent right ICA at the CEA site and minimal left ICA stenosis. The  ICA stenosis is  Unchanged from previous exam.  Plan: Follow-up in 1 year with Carotid Duplex scan.   I discussed in depth with the patient the nature of atherosclerosis, and emphasized the importance of maximal medical management including strict control of blood pressure, blood glucose, and lipid levels, obtaining regular exercise, and continued cessation of smoking.  The patient is aware that without maximal medical management the underlying atherosclerotic disease process will progress, limiting  the benefit of any interventions. The patient was given information about stroke prevention and what symptoms should prompt the  patient to seek immediate medical care. Thank you for allowing [place] to participate in this patient's care.  Clemon Chambers, RN, MSN, FNP-C [facility] Office: [facility] Physician: Oneida Alar  01/10/2014 12:16 PM

## 2014-01-10 NOTE — Addendum Note (Signed)
Addended by: Mena Goes on: 01/10/2014 12:45 PM   Modules accepted: Orders

## 2014-01-10 NOTE — Patient Instructions (Signed)
Stroke Prevention Some medical conditions and behaviors are associated with an increased chance of having a stroke. You may prevent a stroke by making healthy choices and managing medical conditions. HOW CAN I REDUCE MY RISK OF HAVING A STROKE?   Stay physically active. Get at least 30 minutes of activity on most or all days.  Do not smoke. It may also be helpful to avoid exposure to secondhand smoke.  Limit alcohol use. Moderate alcohol use is considered to be:  No more than 2 drinks per day for men.  No more than 1 drink per day for nonpregnant women.  Eat healthy foods. This involves:  Eating 5 or more servings of fruits and vegetables a day.  Making dietary changes that address high blood pressure (hypertension), high cholesterol, diabetes, or obesity.  Manage your cholesterol levels.  Making food choices that are high in fiber and low in saturated fat, trans fat, and cholesterol may control cholesterol levels.  Take any prescribed medicines to control cholesterol as directed by your health care provider.  Manage your diabetes.  Controlling your carbohydrate and sugar intake is recommended to manage diabetes.  Take any prescribed medicines to control diabetes as directed by your health care provider.  Control your hypertension.  Making food choices that are low in salt (sodium), saturated fat, trans fat, and cholesterol is recommended to manage hypertension.  Take any prescribed medicines to control hypertension as directed by your health care provider.  Maintain a healthy weight.  Reducing calorie intake and making food choices that are low in sodium, saturated fat, trans fat, and cholesterol are recommended to manage weight.  Stop drug abuse.  Avoid taking birth control pills.  Talk to your health care provider about the risks of taking birth control pills if you are over 35 years old, smoke, get migraines, or have ever had a blood clot.  Get evaluated for sleep  disorders (sleep apnea).  Talk to your health care provider about getting a sleep evaluation if you snore a lot or have excessive sleepiness.  Take medicines only as directed by your health care provider.  For some people, aspirin or blood thinners (anticoagulants) are helpful in reducing the risk of forming abnormal blood clots that can lead to stroke. If you have the irregular heart rhythm of atrial fibrillation, you should be on a blood thinner unless there is a good reason you cannot take them.  Understand all your medicine instructions.  Make sure that other conditions (such as anemia or atherosclerosis) are addressed. SEEK IMMEDIATE MEDICAL CARE IF:   You have sudden weakness or numbness of the face, arm, or leg, especially on one side of the body.  Your face or eyelid droops to one side.  You have sudden confusion.  You have trouble speaking (aphasia) or understanding.  You have sudden trouble seeing in one or both eyes.  You have sudden trouble walking.  You have dizziness.  You have a loss of balance or coordination.  You have a sudden, severe headache with no known cause.  You have new chest pain or an irregular heartbeat. Any of these symptoms may represent a serious problem that is an emergency. Do not wait to see if the symptoms will go away. Get medical help at once. Call your local emergency services (911 in [place] Do not drive yourself to the hospital. Document Released: 07/08/2004 Document Revised: 10/15/2013 Document Reviewed: 12/01/2012 Exit[employer] Patient Information 2015 [employer]. This information is not intended to replace advice given   to you by your health care provider. Make sure you discuss any questions you have with your health care provider.  

## 2014-02-14 ENCOUNTER — Encounter (INDEPENDENT_AMBULATORY_CARE_PROVIDER_SITE_OTHER): Payer: Medicare Other | Admitting: Ophthalmology

## 2014-02-14 DIAGNOSIS — H401290 Low-tension glaucoma, unspecified eye, stage unspecified: Secondary | ICD-10-CM

## 2014-02-14 DIAGNOSIS — H35039 Hypertensive retinopathy, unspecified eye: Secondary | ICD-10-CM

## 2014-02-14 DIAGNOSIS — I10 Essential (primary) hypertension: Secondary | ICD-10-CM

## 2014-02-14 DIAGNOSIS — H43819 Vitreous degeneration, unspecified eye: Secondary | ICD-10-CM

## 2015-01-14 ENCOUNTER — Encounter: Payer: Self-pay | Admitting: Family

## 2015-01-16 ENCOUNTER — Ambulatory Visit (INDEPENDENT_AMBULATORY_CARE_PROVIDER_SITE_OTHER): Payer: Medicare Other | Admitting: Family

## 2015-01-16 ENCOUNTER — Encounter: Payer: Self-pay | Admitting: Family

## 2015-01-16 ENCOUNTER — Ambulatory Visit (HOSPITAL_COMMUNITY)
Admission: RE | Admit: 2015-01-16 | Discharge: 2015-01-16 | Disposition: A | Discharge status: Home / Self Care | Payer: Medicare Other | Source: Ambulatory Visit | Attending: Family | Admitting: Family

## 2015-01-16 VITALS — BP 137/59 | HR 45 | Temp 97.5°F | Resp 14 | Ht 66.0 in | Wt 143.6 lb

## 2015-01-16 DIAGNOSIS — I6523 Occlusion and stenosis of bilateral carotid arteries: Secondary | ICD-10-CM

## 2015-01-16 DIAGNOSIS — Z48812 Encounter for surgical aftercare following surgery on the circulatory system: Secondary | ICD-10-CM | POA: Diagnosis not present

## 2015-01-16 DIAGNOSIS — Z9889 Other specified postprocedural states: Secondary | ICD-10-CM

## 2015-01-16 NOTE — Patient Instructions (Signed)
Stroke Prevention Some medical conditions and behaviors are associated with an increased chance of having a stroke. You may prevent a stroke by making healthy choices and managing medical conditions. HOW CAN I REDUCE MY RISK OF HAVING A STROKE?   Stay physically active. Get at least 30 minutes of activity on most or all days.  Do not smoke. It may also be helpful to avoid exposure to secondhand smoke.  Limit alcohol use. Moderate alcohol use is considered to be:  No more than 2 drinks per day for men.  No more than 1 drink per day for nonpregnant women.  Eat healthy foods. This involves:  Eating 5 or more servings of fruits and vegetables a day.  Making dietary changes that address high blood pressure (hypertension), high cholesterol, diabetes, or obesity.  Manage your cholesterol levels.  Making food choices that are high in fiber and low in saturated fat, trans fat, and cholesterol may control cholesterol levels.  Take any prescribed medicines to control cholesterol as directed by your health care provider.  Manage your diabetes.  Controlling your carbohydrate and sugar intake is recommended to manage diabetes.  Take any prescribed medicines to control diabetes as directed by your health care provider.  Control your hypertension.  Making food choices that are low in salt (sodium), saturated fat, trans fat, and cholesterol is recommended to manage hypertension.  Take any prescribed medicines to control hypertension as directed by your health care provider.  Maintain a healthy weight.  Reducing calorie intake and making food choices that are low in sodium, saturated fat, trans fat, and cholesterol are recommended to manage weight.  Stop drug abuse.  Avoid taking birth control pills.  Talk to your health care provider about the risks of taking birth control pills if you are over 35 years old, smoke, get migraines, or have ever had a blood clot.  Get evaluated for sleep  disorders (sleep apnea).  Talk to your health care provider about getting a sleep evaluation if you snore a lot or have excessive sleepiness.  Take medicines only as directed by your health care provider.  For some people, aspirin or blood thinners (anticoagulants) are helpful in reducing the risk of forming abnormal blood clots that can lead to stroke. If you have the irregular heart rhythm of atrial fibrillation, you should be on a blood thinner unless there is a good reason you cannot take them.  Understand all your medicine instructions.  Make sure that other conditions (such as anemia or atherosclerosis) are addressed. SEEK IMMEDIATE MEDICAL CARE IF:   You have sudden weakness or numbness of the face, arm, or leg, especially on one side of the body.  Your face or eyelid droops to one side.  You have sudden confusion.  You have trouble speaking (aphasia) or understanding.  You have sudden trouble seeing in one or both eyes.  You have sudden trouble walking.  You have dizziness.  You have a loss of balance or coordination.  You have a sudden, severe headache with no known cause.  You have new chest pain or an irregular heartbeat. Any of these symptoms may represent a serious problem that is an emergency. Do not wait to see if the symptoms will go away. Get medical help at once. Call your local emergency services (911 in [place] Do not drive yourself to the hospital. Document Released: 07/08/2004 Document Revised: 10/15/2013 Document Reviewed: 12/01/2012 Exit[employer] Patient Information 2015 [employer]. This information is not intended to replace advice given   to you by your health care provider. Make sure you discuss any questions you have with your health care provider.  

## 2015-01-16 NOTE — Progress Notes (Signed)
Filed Vitals:   01/16/15 1148 01/16/15 1151 01/16/15 1157  BP: 135/60 145/60 137/59  Pulse: 48 49 45  Temp:   97.5 F (36.4 C)  TempSrc:   Oral  Resp:  14   Height:  5\' 6"  (1.676 m)   Weight:  143 lb 9.6 oz (65.137 kg)   SpO2:  99%

## 2015-01-16 NOTE — Progress Notes (Signed)
Established Carotid Patient   History of Present Illness  [name] is a 80 y.o. male patient of Dr. Donnetta Hutching is who is status post a right CEA in November 2010 which was precluded by a CVA in October 2010. He returns today for follow up. He has had no stroke or TIA activity since 2010; at that time his stroke manifested as manifested as lack of dexterity in his left hand and expressive aphasia, denies monocular loss of vision.  His symptoms gradually improved over 3 months.   The patient New Medical or Surgical History: concern about food/liquids/saliva getting stuck in his throat. He first noticed this earlier this year or late last year, and is increasing in frequency, not increasing in severity. He does have known GERD and takes an H2 blocker. Pt advised to speak with his PCP re this.  His diet is vegetarian.  Pt Diabetic: no Pt smoker: non-smoker  Pt meds include: Statin : Yes ASA: Yes Other anticoagulants/antiplatelets: no   Past Medical History  Diagnosis Date  . Hemorrhoids   . Hernia   . GERD (gastroesophageal reflux disease)   . Hyperlipemia   . History of BPH   . Mitral regurgitation      status post mitral valve repair at [facility] in [place]  . CVA (cerebral infarction)     Significant carotid artery disease status post right carotid endarterectomy, postoperatively  . Depression   . Status post coronary artery bypass grafting     Single-vessel coronary bypass grafting at time of mitral valve repair  . Thyroid disease     hypothyroidism  . Stroke Oct 2010  . Hypertension   . Heart murmur   . Hypothyroidism     Social History History  Substance Use Topics  . Smoking status: Never Smoker   . Smokeless tobacco: Never Used  . Alcohol Use: No    Family History Family History  Problem Relation Age of Onset  . Colon cancer Neg Hx   . Heart attack Father     Surgical History Past Surgical History  Procedure Laterality Date  .  Cholecystectomy    . Coronary artery bypass graft    . Mitral valve repair  April 2010  . Turp vaporization    . Cataract extraction    . Nevus removed    . Ercp      with biliary and pancreatic stenting  . Inguinal hernia repair  01/19/2011    Bilateral  . Wart fulguration  01/03/2012    Procedure: FULGURATION ANAL WART;  Surgeon: Zenovia Jarred, MD;  Location: [place];  Service: General;  Laterality: N/A;  excision perianal condylomata  . Mitral valve repair  2010  . Colonoscopy N/A 05/30/2013    Procedure: COLONOSCOPY;  Surgeon: Rogene Houston, MD;  [facility];  Service: Endoscopy;  Laterality: N/A;  1230-rescheduled to 12:00 Ann notified pt  . Carotid endarterectomy Right Nov. 2010    CEA    Allergies  Allergen Reactions  . Ciprofloxacin Anaphylaxis and Nausea And Vomiting    High dose    Current Outpatient Prescriptions  Medication Sig Dispense Refill  . aspirin EC 81 MG tablet Take 81 mg by mouth daily.    . bimatoprost (LUMIGAN) 0.01 % SOLN Place 1 drop into both eyes at bedtime.    . carvedilol (COREG) 3.125 MG tablet Take by mouth 2 (two) times daily.    Marland Kitchen levothyroxine (LEVOTHROID) 50 MCG tablet  Take 50 mcg by mouth daily.      Marland Kitchen lisinopril (PRINIVIL,ZESTRIL) 10 MG tablet Take 5 mg by mouth daily. 10 mg    . pregabalin (LYRICA) 50 MG capsule Take 75 mg by mouth 2 (two) times daily.     . ranitidine (ZANTAC) 150 MG tablet Take 150 mg by mouth 2 (two) times daily.      . rosuvastatin (CRESTOR) 5 MG tablet Take 5 mg by mouth daily.      . silodosin (RAPAFLO) 8 MG CAPS capsule Take 8 mg by mouth daily with breakfast.    . vitamin B-12 (CYANOCOBALAMIN) 500 MCG tablet Take 500 mcg by mouth daily.      Marland Kitchen latanoprost (XALATAN) 0.005 % ophthalmic solution Place 1 drop into both eyes at bedtime.    . prednisoLONE acetate (PRED FORTE) 1 % ophthalmic suspension      No current facility-administered medications for this visit.    Review of  Systems : See HPI for pertinent positives and negatives.  Physical Examination  Filed Vitals:   01/16/15 1148 01/16/15 1151 01/16/15 1157  BP: 135/60 145/60 137/59  Pulse: 48 49 45  Temp:   97.5 F (36.4 C)  TempSrc:   Oral  Resp:  14   Height:  5\' 6"  (1.676 m)   Weight:  143 lb 9.6 oz (65.137 kg)   SpO2:  99%    Body mass index is 23.19 kg/(m^2).  General: WDWN male in NAD GAIT: normal Eyes: PERRLA Pulmonary: Non-labored, CTAB, Negative Rales, Negative rhonchi, & Negative wheezing.  Cardiac: regular Rhythm , Negative detected murmur.  VASCULAR EXAM Carotid Bruits Right Left   positive Negative    Radial pulses are 2+ palpable and equal.      LE Pulses Right Left   POPLITEAL not palpable  not palpable   POSTERIOR TIBIAL  palpable   palpable    DORSALIS PEDIS  ANTERIOR TIBIAL palpable  palpable     Gastrointestinal: soft, nontender, BS WNL, no r/g,no palpable masses.  Musculoskeletal: Negative muscle atrophy/wasting. M/S 5/5 throughout, Extremities without ischemic changes.  Neurologic: A&O X 3; Appropriate Affect, Speech is normal CN 2-12 intact, Pain and light touch intact in extremities, Motor exam as listed above.         Non-Invasive Vascular Imaging CAROTID DUPLEX 01/16/2015   CEREBROVASCULAR DUPLEX EVALUATION    INDICATION: Carotid endarterectomy     PREVIOUS INTERVENTION(S): Right carotid endarterectomy on 04/22/09    DUPLEX EXAM:     RIGHT  LEFT  Peak Systolic Velocities (cm/s) End Diastolic Velocities (cm/s) Plaque LOCATION Peak Systolic Velocities (cm/s) End Diastolic Velocities (cm/s) Plaque  77 6  CCA PROXIMAL 70 8   63 8  CCA MID 78 8   63 6 HT CCA DISTAL 94 11 HM  99 0  ECA 93 0   60 10 HT ICA PROXIMAL 73 16 HT  67 13  ICA MID 71 16   48 10  ICA DISTAL 62 10      Not Calculated ICA / CCA Ratio (PSV) 0.78  Antegrade Vertebral Flow Antegrade   Brachial Systolic Pressure (mmHg)   Multiphasic (subclavian artery) Brachial Artery Waveforms Multiphasic (subclavian artery)    Plaque Morphology:  HM = Homogeneous, HT = Heterogeneous, CP = Calcific Plaque, SP = Smooth Plaque, IP = Irregular Plaque     ADDITIONAL FINDINGS: No significant stenosis of the bilateral external or common carotid arteries.    IMPRESSION: Patent right carotid endarterectomy site with Doppler velocities  suggest a 1-39% stenoses of the bilateral proximal internal carotid arteries.    Compared to the previous exam:  No significant change noted when compared to the previous exam on 01/10/14.      Assessment: EVERRETT LACASSE is a 79 y.o. male who is status post a right CEA in November 2010 which was precluded by a CVA in October 2010. He has had no stroke or TIA activity since 2010, before the CEA. Today's carotid Duplex suggests a patent right carotid endarterectomy site with Doppler velocities suggest a 1-39% stenoses of the bilateral proximal internal carotid arteries. No significant change noted when compared to the previous exam on 01/10/14.   Plan: Follow-up in 1 year with Carotid Duplex.    I discussed in depth with the patient the nature of atherosclerosis, and emphasized the importance of maximal medical management including strict control of blood pressure, blood glucose, and lipid levels, obtaining regular exercise, and continued cessation of smoking.  The patient is aware that without maximal medical management the underlying atherosclerotic disease process will progress, limiting the benefit of any interventions. The patient was given information about stroke prevention and what symptoms should prompt the patient to seek immediate medical care. Thank you for allowing [place] to participate in this patient's care.  Clemon Chambers, RN, MSN, FNP-C [facility] Office: [facility] Physician: Oneida Alar  01/16/2015 12:12 PM

## 2015-01-17 NOTE — Addendum Note (Signed)
Addended by: Dorthula Rue L on: 01/17/2015 11:13 AM   Modules accepted: Orders

## 2015-07-15 DIAGNOSIS — I639 Cerebral infarction, unspecified: Secondary | ICD-10-CM | POA: Diagnosis not present

## 2015-07-15 DIAGNOSIS — I10 Essential (primary) hypertension: Secondary | ICD-10-CM | POA: Diagnosis not present

## 2015-07-15 DIAGNOSIS — Z6822 Body mass index (BMI) 22.0-22.9, adult: Secondary | ICD-10-CM | POA: Diagnosis not present

## 2015-07-15 DIAGNOSIS — E039 Hypothyroidism, unspecified: Secondary | ICD-10-CM | POA: Diagnosis not present

## 2015-07-15 DIAGNOSIS — Z713 Dietary counseling and surveillance: Secondary | ICD-10-CM | POA: Diagnosis not present

## 2015-08-04 DIAGNOSIS — Z961 Presence of intraocular lens: Secondary | ICD-10-CM | POA: Diagnosis not present

## 2015-08-04 DIAGNOSIS — H01024 Squamous blepharitis left upper eyelid: Secondary | ICD-10-CM | POA: Diagnosis not present

## 2015-08-04 DIAGNOSIS — H01021 Squamous blepharitis right upper eyelid: Secondary | ICD-10-CM | POA: Diagnosis not present

## 2015-08-04 DIAGNOSIS — H01022 Squamous blepharitis right lower eyelid: Secondary | ICD-10-CM | POA: Diagnosis not present

## 2015-08-04 DIAGNOSIS — H43811 Vitreous degeneration, right eye: Secondary | ICD-10-CM | POA: Diagnosis not present

## 2015-08-04 DIAGNOSIS — H04123 Dry eye syndrome of bilateral lacrimal glands: Secondary | ICD-10-CM | POA: Diagnosis not present

## 2015-08-04 DIAGNOSIS — H401132 Primary open-angle glaucoma, bilateral, moderate stage: Secondary | ICD-10-CM | POA: Diagnosis not present

## 2015-08-04 DIAGNOSIS — H01025 Squamous blepharitis left lower eyelid: Secondary | ICD-10-CM | POA: Diagnosis not present

## 2015-08-15 DIAGNOSIS — Z23 Encounter for immunization: Secondary | ICD-10-CM | POA: Diagnosis not present

## 2015-09-22 DIAGNOSIS — R5383 Other fatigue: Secondary | ICD-10-CM | POA: Diagnosis not present

## 2015-09-22 DIAGNOSIS — Z299 Encounter for prophylactic measures, unspecified: Secondary | ICD-10-CM | POA: Diagnosis not present

## 2015-09-22 DIAGNOSIS — Z789 Other specified health status: Secondary | ICD-10-CM | POA: Diagnosis not present

## 2015-09-22 DIAGNOSIS — Z1211 Encounter for screening for malignant neoplasm of colon: Secondary | ICD-10-CM | POA: Diagnosis not present

## 2015-09-22 DIAGNOSIS — Z7189 Other specified counseling: Secondary | ICD-10-CM | POA: Diagnosis not present

## 2015-09-22 DIAGNOSIS — Z0000 Encounter for general adult medical examination without abnormal findings: Secondary | ICD-10-CM | POA: Diagnosis not present

## 2015-09-22 DIAGNOSIS — E7800 Pure hypercholesterolemia, unspecified: Secondary | ICD-10-CM | POA: Diagnosis not present

## 2015-09-22 DIAGNOSIS — Z1389 Encounter for screening for other disorder: Secondary | ICD-10-CM | POA: Diagnosis not present

## 2015-09-22 DIAGNOSIS — Z125 Encounter for screening for malignant neoplasm of prostate: Secondary | ICD-10-CM | POA: Diagnosis not present

## 2015-09-22 DIAGNOSIS — Z79899 Other long term (current) drug therapy: Secondary | ICD-10-CM | POA: Diagnosis not present

## 2015-11-12 DIAGNOSIS — N138 Other obstructive and reflux uropathy: Secondary | ICD-10-CM | POA: Diagnosis not present

## 2015-11-12 DIAGNOSIS — R351 Nocturia: Secondary | ICD-10-CM | POA: Diagnosis not present

## 2015-11-12 DIAGNOSIS — R3915 Urgency of urination: Secondary | ICD-10-CM | POA: Diagnosis not present

## 2015-11-12 DIAGNOSIS — Z0000 Encounter for general adult medical examination without abnormal findings: Secondary | ICD-10-CM | POA: Diagnosis not present

## 2015-11-12 DIAGNOSIS — N401 Enlarged prostate with lower urinary tract symptoms: Secondary | ICD-10-CM | POA: Diagnosis not present

## 2015-12-02 DIAGNOSIS — H401132 Primary open-angle glaucoma, bilateral, moderate stage: Secondary | ICD-10-CM | POA: Diagnosis not present

## 2015-12-02 DIAGNOSIS — H01025 Squamous blepharitis left lower eyelid: Secondary | ICD-10-CM | POA: Diagnosis not present

## 2015-12-02 DIAGNOSIS — H01022 Squamous blepharitis right lower eyelid: Secondary | ICD-10-CM | POA: Diagnosis not present

## 2015-12-02 DIAGNOSIS — H04123 Dry eye syndrome of bilateral lacrimal glands: Secondary | ICD-10-CM | POA: Diagnosis not present

## 2015-12-02 DIAGNOSIS — H01024 Squamous blepharitis left upper eyelid: Secondary | ICD-10-CM | POA: Diagnosis not present

## 2015-12-02 DIAGNOSIS — H01021 Squamous blepharitis right upper eyelid: Secondary | ICD-10-CM | POA: Diagnosis not present

## 2015-12-02 DIAGNOSIS — Z961 Presence of intraocular lens: Secondary | ICD-10-CM | POA: Diagnosis not present

## 2015-12-02 DIAGNOSIS — H43811 Vitreous degeneration, right eye: Secondary | ICD-10-CM | POA: Diagnosis not present

## 2015-12-10 DIAGNOSIS — I10 Essential (primary) hypertension: Secondary | ICD-10-CM | POA: Diagnosis not present

## 2015-12-10 DIAGNOSIS — E7800 Pure hypercholesterolemia, unspecified: Secondary | ICD-10-CM | POA: Diagnosis not present

## 2015-12-10 DIAGNOSIS — I2510 Atherosclerotic heart disease of native coronary artery without angina pectoris: Secondary | ICD-10-CM | POA: Diagnosis not present

## 2015-12-12 DIAGNOSIS — R5383 Other fatigue: Secondary | ICD-10-CM | POA: Diagnosis not present

## 2015-12-12 DIAGNOSIS — E119 Type 2 diabetes mellitus without complications: Secondary | ICD-10-CM | POA: Diagnosis not present

## 2015-12-22 DIAGNOSIS — Z952 Presence of prosthetic heart valve: Secondary | ICD-10-CM | POA: Diagnosis not present

## 2015-12-22 DIAGNOSIS — I2510 Atherosclerotic heart disease of native coronary artery without angina pectoris: Secondary | ICD-10-CM | POA: Diagnosis not present

## 2015-12-22 DIAGNOSIS — Z9889 Other specified postprocedural states: Secondary | ICD-10-CM | POA: Diagnosis not present

## 2015-12-22 DIAGNOSIS — I10 Essential (primary) hypertension: Secondary | ICD-10-CM | POA: Diagnosis not present

## 2015-12-29 DIAGNOSIS — I10 Essential (primary) hypertension: Secondary | ICD-10-CM | POA: Diagnosis not present

## 2015-12-29 DIAGNOSIS — I129 Hypertensive chronic kidney disease with stage 1 through stage 4 chronic kidney disease, or unspecified chronic kidney disease: Secondary | ICD-10-CM | POA: Diagnosis not present

## 2015-12-29 DIAGNOSIS — I2510 Atherosclerotic heart disease of native coronary artery without angina pectoris: Secondary | ICD-10-CM | POA: Diagnosis not present

## 2015-12-29 DIAGNOSIS — E039 Hypothyroidism, unspecified: Secondary | ICD-10-CM | POA: Diagnosis not present

## 2015-12-29 DIAGNOSIS — R0602 Shortness of breath: Secondary | ICD-10-CM | POA: Diagnosis not present

## 2016-01-06 DIAGNOSIS — R0602 Shortness of breath: Secondary | ICD-10-CM | POA: Diagnosis not present

## 2016-01-06 DIAGNOSIS — R0989 Other specified symptoms and signs involving the circulatory and respiratory systems: Secondary | ICD-10-CM | POA: Diagnosis not present

## 2016-01-06 DIAGNOSIS — Z9889 Other specified postprocedural states: Secondary | ICD-10-CM | POA: Diagnosis not present

## 2016-01-06 DIAGNOSIS — I2510 Atherosclerotic heart disease of native coronary artery without angina pectoris: Secondary | ICD-10-CM | POA: Diagnosis not present

## 2016-01-26 DIAGNOSIS — I129 Hypertensive chronic kidney disease with stage 1 through stage 4 chronic kidney disease, or unspecified chronic kidney disease: Secondary | ICD-10-CM | POA: Diagnosis not present

## 2016-01-27 ENCOUNTER — Ambulatory Visit: Payer: Medicare Other | Admitting: Family

## 2016-01-27 ENCOUNTER — Encounter (HOSPITAL_COMMUNITY): Payer: Medicare Other

## 2016-01-27 DIAGNOSIS — I10 Essential (primary) hypertension: Secondary | ICD-10-CM | POA: Diagnosis not present

## 2016-01-27 DIAGNOSIS — I2510 Atherosclerotic heart disease of native coronary artery without angina pectoris: Secondary | ICD-10-CM | POA: Diagnosis not present

## 2016-01-27 DIAGNOSIS — Z9889 Other specified postprocedural states: Secondary | ICD-10-CM | POA: Diagnosis not present

## 2016-01-27 DIAGNOSIS — Z952 Presence of prosthetic heart valve: Secondary | ICD-10-CM | POA: Diagnosis not present

## 2016-01-30 DIAGNOSIS — E119 Type 2 diabetes mellitus without complications: Secondary | ICD-10-CM | POA: Diagnosis not present

## 2016-02-06 DIAGNOSIS — I10 Essential (primary) hypertension: Secondary | ICD-10-CM | POA: Diagnosis not present

## 2016-02-06 DIAGNOSIS — E7800 Pure hypercholesterolemia, unspecified: Secondary | ICD-10-CM | POA: Diagnosis not present

## 2016-02-06 DIAGNOSIS — I2510 Atherosclerotic heart disease of native coronary artery without angina pectoris: Secondary | ICD-10-CM | POA: Diagnosis not present

## 2016-03-12 DIAGNOSIS — I10 Essential (primary) hypertension: Secondary | ICD-10-CM | POA: Diagnosis not present

## 2016-03-12 DIAGNOSIS — E039 Hypothyroidism, unspecified: Secondary | ICD-10-CM | POA: Diagnosis not present

## 2016-03-12 DIAGNOSIS — E119 Type 2 diabetes mellitus without complications: Secondary | ICD-10-CM | POA: Diagnosis not present

## 2016-03-12 DIAGNOSIS — N400 Enlarged prostate without lower urinary tract symptoms: Secondary | ICD-10-CM | POA: Diagnosis not present

## 2016-03-12 DIAGNOSIS — Z299 Encounter for prophylactic measures, unspecified: Secondary | ICD-10-CM | POA: Diagnosis not present

## 2016-03-22 ENCOUNTER — Encounter (HOSPITAL_COMMUNITY)
Admission: RE | Admit: 2016-03-22 | Discharge: 2016-03-22 | Disposition: A | Discharge status: Home / Self Care | Payer: Self-pay | Source: Ambulatory Visit | Attending: Cardiology | Admitting: Cardiology

## 2016-03-22 DIAGNOSIS — Z951 Presence of aortocoronary bypass graft: Secondary | ICD-10-CM | POA: Insufficient documentation

## 2016-03-24 ENCOUNTER — Encounter (HOSPITAL_COMMUNITY)
Admission: RE | Admit: 2016-03-24 | Discharge: 2016-03-24 | Disposition: A | Discharge status: Home / Self Care | Payer: Self-pay | Source: Ambulatory Visit | Attending: Cardiology | Admitting: Cardiology

## 2016-03-26 ENCOUNTER — Encounter (HOSPITAL_COMMUNITY)
Admission: RE | Admit: 2016-03-26 | Discharge: 2016-03-26 | Disposition: A | Discharge status: Home / Self Care | Payer: Self-pay | Source: Ambulatory Visit | Attending: Cardiology | Admitting: Cardiology

## 2016-03-29 ENCOUNTER — Encounter (HOSPITAL_COMMUNITY)
Admission: RE | Admit: 2016-03-29 | Discharge: 2016-03-29 | Disposition: A | Discharge status: Home / Self Care | Payer: Self-pay | Source: Ambulatory Visit | Attending: Cardiology | Admitting: Cardiology

## 2016-03-31 ENCOUNTER — Encounter (HOSPITAL_COMMUNITY)
Admission: RE | Admit: 2016-03-31 | Discharge: 2016-03-31 | Disposition: A | Discharge status: Home / Self Care | Payer: Self-pay | Source: Ambulatory Visit | Attending: Cardiology | Admitting: Cardiology

## 2016-04-02 ENCOUNTER — Encounter (HOSPITAL_COMMUNITY)
Admission: RE | Admit: 2016-04-02 | Discharge: 2016-04-02 | Disposition: A | Discharge status: Home / Self Care | Payer: Self-pay | Source: Ambulatory Visit | Attending: Cardiology | Admitting: Cardiology

## 2016-04-05 ENCOUNTER — Encounter (HOSPITAL_COMMUNITY)
Admission: RE | Admit: 2016-04-05 | Discharge: 2016-04-05 | Disposition: A | Discharge status: Home / Self Care | Payer: Self-pay | Source: Ambulatory Visit | Attending: Cardiology | Admitting: Cardiology

## 2016-04-06 DIAGNOSIS — H01024 Squamous blepharitis left upper eyelid: Secondary | ICD-10-CM | POA: Diagnosis not present

## 2016-04-06 DIAGNOSIS — Z961 Presence of intraocular lens: Secondary | ICD-10-CM | POA: Diagnosis not present

## 2016-04-06 DIAGNOSIS — H01022 Squamous blepharitis right lower eyelid: Secondary | ICD-10-CM | POA: Diagnosis not present

## 2016-04-06 DIAGNOSIS — H01025 Squamous blepharitis left lower eyelid: Secondary | ICD-10-CM | POA: Diagnosis not present

## 2016-04-06 DIAGNOSIS — H43811 Vitreous degeneration, right eye: Secondary | ICD-10-CM | POA: Diagnosis not present

## 2016-04-06 DIAGNOSIS — H04123 Dry eye syndrome of bilateral lacrimal glands: Secondary | ICD-10-CM | POA: Diagnosis not present

## 2016-04-06 DIAGNOSIS — H01021 Squamous blepharitis right upper eyelid: Secondary | ICD-10-CM | POA: Diagnosis not present

## 2016-04-06 DIAGNOSIS — H401132 Primary open-angle glaucoma, bilateral, moderate stage: Secondary | ICD-10-CM | POA: Diagnosis not present

## 2016-04-07 ENCOUNTER — Encounter (HOSPITAL_COMMUNITY)
Admission: RE | Admit: 2016-04-07 | Discharge: 2016-04-07 | Disposition: A | Discharge status: Home / Self Care | Payer: Self-pay | Source: Ambulatory Visit | Attending: Cardiology | Admitting: Cardiology

## 2016-04-09 ENCOUNTER — Encounter (HOSPITAL_COMMUNITY)
Admission: RE | Admit: 2016-04-09 | Discharge: 2016-04-09 | Disposition: A | Discharge status: Home / Self Care | Payer: Self-pay | Source: Ambulatory Visit | Attending: Cardiology | Admitting: Cardiology

## 2016-04-12 ENCOUNTER — Encounter (HOSPITAL_COMMUNITY): Payer: Self-pay

## 2016-04-14 ENCOUNTER — Encounter (HOSPITAL_COMMUNITY): Payer: Self-pay

## 2016-04-14 DIAGNOSIS — N400 Enlarged prostate without lower urinary tract symptoms: Secondary | ICD-10-CM | POA: Diagnosis not present

## 2016-04-14 DIAGNOSIS — K219 Gastro-esophageal reflux disease without esophagitis: Secondary | ICD-10-CM | POA: Diagnosis not present

## 2016-04-14 DIAGNOSIS — I639 Cerebral infarction, unspecified: Secondary | ICD-10-CM | POA: Diagnosis not present

## 2016-04-14 DIAGNOSIS — Z299 Encounter for prophylactic measures, unspecified: Secondary | ICD-10-CM | POA: Diagnosis not present

## 2016-04-14 DIAGNOSIS — I2510 Atherosclerotic heart disease of native coronary artery without angina pectoris: Secondary | ICD-10-CM | POA: Insufficient documentation

## 2016-04-14 DIAGNOSIS — K529 Noninfective gastroenteritis and colitis, unspecified: Secondary | ICD-10-CM | POA: Diagnosis not present

## 2016-04-16 ENCOUNTER — Encounter (HOSPITAL_COMMUNITY)
Admission: RE | Admit: 2016-04-16 | Discharge: 2016-04-16 | Disposition: A | Discharge status: Home / Self Care | Payer: Self-pay | Source: Ambulatory Visit | Attending: Cardiology | Admitting: Cardiology

## 2016-04-19 ENCOUNTER — Encounter (HOSPITAL_COMMUNITY)
Admission: RE | Admit: 2016-04-19 | Discharge: 2016-04-19 | Disposition: A | Discharge status: Home / Self Care | Payer: Self-pay | Source: Ambulatory Visit | Attending: Cardiology | Admitting: Cardiology

## 2016-04-21 ENCOUNTER — Encounter (HOSPITAL_COMMUNITY)
Admission: RE | Admit: 2016-04-21 | Discharge: 2016-04-21 | Disposition: A | Discharge status: Home / Self Care | Payer: Self-pay | Source: Ambulatory Visit | Attending: Cardiology | Admitting: Cardiology

## 2016-04-23 ENCOUNTER — Encounter (HOSPITAL_COMMUNITY)
Admission: RE | Admit: 2016-04-23 | Discharge: 2016-04-23 | Disposition: A | Discharge status: Home / Self Care | Payer: Self-pay | Source: Ambulatory Visit | Attending: Cardiology | Admitting: Cardiology

## 2016-04-26 ENCOUNTER — Encounter (HOSPITAL_COMMUNITY)
Admission: RE | Admit: 2016-04-26 | Discharge: 2016-04-26 | Disposition: A | Discharge status: Home / Self Care | Payer: Self-pay | Source: Ambulatory Visit | Attending: Cardiology | Admitting: Cardiology

## 2016-04-27 DIAGNOSIS — I10 Essential (primary) hypertension: Secondary | ICD-10-CM | POA: Diagnosis not present

## 2016-04-27 DIAGNOSIS — N400 Enlarged prostate without lower urinary tract symptoms: Secondary | ICD-10-CM | POA: Diagnosis not present

## 2016-04-27 DIAGNOSIS — E039 Hypothyroidism, unspecified: Secondary | ICD-10-CM | POA: Diagnosis not present

## 2016-04-27 DIAGNOSIS — Z6822 Body mass index (BMI) 22.0-22.9, adult: Secondary | ICD-10-CM | POA: Diagnosis not present

## 2016-04-28 ENCOUNTER — Encounter (HOSPITAL_COMMUNITY)
Admission: RE | Admit: 2016-04-28 | Discharge: 2016-04-28 | Disposition: A | Discharge status: Home / Self Care | Payer: Medicare Other | Source: Ambulatory Visit | Attending: Cardiology | Admitting: Cardiology

## 2016-04-30 ENCOUNTER — Encounter (HOSPITAL_COMMUNITY)
Admission: RE | Admit: 2016-04-30 | Discharge: 2016-04-30 | Disposition: A | Discharge status: Home / Self Care | Payer: Self-pay | Source: Ambulatory Visit | Attending: Cardiology | Admitting: Cardiology

## 2016-05-03 ENCOUNTER — Encounter (HOSPITAL_COMMUNITY): Payer: Self-pay

## 2016-05-04 DIAGNOSIS — Z23 Encounter for immunization: Secondary | ICD-10-CM | POA: Diagnosis not present

## 2016-05-05 ENCOUNTER — Encounter (HOSPITAL_COMMUNITY)
Admission: RE | Admit: 2016-05-05 | Discharge: 2016-05-05 | Disposition: A | Discharge status: Home / Self Care | Payer: Self-pay | Source: Ambulatory Visit | Attending: Cardiology | Admitting: Cardiology

## 2016-05-10 ENCOUNTER — Encounter (HOSPITAL_COMMUNITY): Payer: Self-pay

## 2016-05-12 ENCOUNTER — Encounter (HOSPITAL_COMMUNITY): Payer: Self-pay

## 2016-05-14 ENCOUNTER — Encounter (HOSPITAL_COMMUNITY): Payer: Self-pay

## 2016-05-17 ENCOUNTER — Encounter (HOSPITAL_COMMUNITY): Payer: Self-pay

## 2016-05-19 ENCOUNTER — Encounter (HOSPITAL_COMMUNITY): Payer: Self-pay

## 2016-05-21 ENCOUNTER — Encounter (HOSPITAL_COMMUNITY): Payer: Self-pay

## 2016-05-24 ENCOUNTER — Encounter (HOSPITAL_COMMUNITY): Payer: Self-pay

## 2016-05-26 ENCOUNTER — Encounter (HOSPITAL_COMMUNITY): Payer: Self-pay

## 2016-05-28 ENCOUNTER — Encounter (HOSPITAL_COMMUNITY): Payer: Self-pay

## 2016-05-31 ENCOUNTER — Encounter (HOSPITAL_COMMUNITY): Payer: Self-pay

## 2016-06-02 ENCOUNTER — Encounter (HOSPITAL_COMMUNITY): Payer: Self-pay

## 2016-06-04 ENCOUNTER — Encounter (HOSPITAL_COMMUNITY): Payer: Self-pay

## 2016-06-04 DIAGNOSIS — R270 Ataxia, unspecified: Secondary | ICD-10-CM | POA: Diagnosis not present

## 2016-06-04 DIAGNOSIS — Z299 Encounter for prophylactic measures, unspecified: Secondary | ICD-10-CM | POA: Diagnosis not present

## 2016-06-04 DIAGNOSIS — R5383 Other fatigue: Secondary | ICD-10-CM | POA: Diagnosis not present

## 2016-06-04 DIAGNOSIS — Z79899 Other long term (current) drug therapy: Secondary | ICD-10-CM | POA: Diagnosis not present

## 2016-06-04 DIAGNOSIS — R279 Unspecified lack of coordination: Secondary | ICD-10-CM | POA: Diagnosis not present

## 2016-06-04 DIAGNOSIS — I10 Essential (primary) hypertension: Secondary | ICD-10-CM | POA: Diagnosis not present

## 2016-06-09 ENCOUNTER — Encounter (HOSPITAL_COMMUNITY): Payer: Self-pay

## 2016-06-11 ENCOUNTER — Encounter (HOSPITAL_COMMUNITY): Payer: Self-pay

## 2016-06-23 DIAGNOSIS — I2510 Atherosclerotic heart disease of native coronary artery without angina pectoris: Secondary | ICD-10-CM | POA: Diagnosis not present

## 2016-06-23 DIAGNOSIS — Z9889 Other specified postprocedural states: Secondary | ICD-10-CM | POA: Diagnosis not present

## 2016-06-23 DIAGNOSIS — Z952 Presence of prosthetic heart valve: Secondary | ICD-10-CM | POA: Diagnosis not present

## 2016-06-23 DIAGNOSIS — F4323 Adjustment disorder with mixed anxiety and depressed mood: Secondary | ICD-10-CM | POA: Diagnosis not present

## 2016-08-03 DIAGNOSIS — Z952 Presence of prosthetic heart valve: Secondary | ICD-10-CM | POA: Diagnosis not present

## 2016-08-03 DIAGNOSIS — I2510 Atherosclerotic heart disease of native coronary artery without angina pectoris: Secondary | ICD-10-CM | POA: Diagnosis not present

## 2016-08-03 DIAGNOSIS — F4323 Adjustment disorder with mixed anxiety and depressed mood: Secondary | ICD-10-CM | POA: Diagnosis not present

## 2016-08-03 DIAGNOSIS — I10 Essential (primary) hypertension: Secondary | ICD-10-CM | POA: Diagnosis not present

## 2016-09-07 DIAGNOSIS — H04123 Dry eye syndrome of bilateral lacrimal glands: Secondary | ICD-10-CM | POA: Diagnosis not present

## 2016-09-07 DIAGNOSIS — H01022 Squamous blepharitis right lower eyelid: Secondary | ICD-10-CM | POA: Diagnosis not present

## 2016-09-07 DIAGNOSIS — H43811 Vitreous degeneration, right eye: Secondary | ICD-10-CM | POA: Diagnosis not present

## 2016-09-07 DIAGNOSIS — H401132 Primary open-angle glaucoma, bilateral, moderate stage: Secondary | ICD-10-CM | POA: Diagnosis not present

## 2016-09-07 DIAGNOSIS — H01025 Squamous blepharitis left lower eyelid: Secondary | ICD-10-CM | POA: Diagnosis not present

## 2016-09-07 DIAGNOSIS — Z961 Presence of intraocular lens: Secondary | ICD-10-CM | POA: Diagnosis not present

## 2016-09-07 DIAGNOSIS — H01024 Squamous blepharitis left upper eyelid: Secondary | ICD-10-CM | POA: Diagnosis not present

## 2016-09-07 DIAGNOSIS — H01021 Squamous blepharitis right upper eyelid: Secondary | ICD-10-CM | POA: Diagnosis not present

## 2016-09-28 DIAGNOSIS — Z299 Encounter for prophylactic measures, unspecified: Secondary | ICD-10-CM | POA: Diagnosis not present

## 2016-09-28 DIAGNOSIS — I10 Essential (primary) hypertension: Secondary | ICD-10-CM | POA: Diagnosis not present

## 2016-09-28 DIAGNOSIS — Z713 Dietary counseling and surveillance: Secondary | ICD-10-CM | POA: Diagnosis not present

## 2016-09-28 DIAGNOSIS — N400 Enlarged prostate without lower urinary tract symptoms: Secondary | ICD-10-CM | POA: Diagnosis not present

## 2016-09-28 DIAGNOSIS — Z6822 Body mass index (BMI) 22.0-22.9, adult: Secondary | ICD-10-CM | POA: Diagnosis not present

## 2016-09-28 DIAGNOSIS — I639 Cerebral infarction, unspecified: Secondary | ICD-10-CM | POA: Diagnosis not present

## 2016-09-28 DIAGNOSIS — E039 Hypothyroidism, unspecified: Secondary | ICD-10-CM | POA: Diagnosis not present

## 2016-09-28 DIAGNOSIS — L910 Hypertrophic scar: Secondary | ICD-10-CM | POA: Diagnosis not present

## 2016-09-28 DIAGNOSIS — M25562 Pain in left knee: Secondary | ICD-10-CM | POA: Diagnosis not present

## 2016-11-10 DIAGNOSIS — Z7189 Other specified counseling: Secondary | ICD-10-CM | POA: Diagnosis not present

## 2016-11-10 DIAGNOSIS — Z125 Encounter for screening for malignant neoplasm of prostate: Secondary | ICD-10-CM | POA: Diagnosis not present

## 2016-11-10 DIAGNOSIS — K219 Gastro-esophageal reflux disease without esophagitis: Secondary | ICD-10-CM | POA: Diagnosis not present

## 2016-11-10 DIAGNOSIS — E039 Hypothyroidism, unspecified: Secondary | ICD-10-CM | POA: Diagnosis not present

## 2016-11-10 DIAGNOSIS — N400 Enlarged prostate without lower urinary tract symptoms: Secondary | ICD-10-CM | POA: Diagnosis not present

## 2016-11-10 DIAGNOSIS — R5383 Other fatigue: Secondary | ICD-10-CM | POA: Diagnosis not present

## 2016-11-10 DIAGNOSIS — I2510 Atherosclerotic heart disease of native coronary artery without angina pectoris: Secondary | ICD-10-CM | POA: Diagnosis not present

## 2016-11-10 DIAGNOSIS — Z299 Encounter for prophylactic measures, unspecified: Secondary | ICD-10-CM | POA: Diagnosis not present

## 2016-11-10 DIAGNOSIS — Z6822 Body mass index (BMI) 22.0-22.9, adult: Secondary | ICD-10-CM | POA: Diagnosis not present

## 2016-11-10 DIAGNOSIS — E7800 Pure hypercholesterolemia, unspecified: Secondary | ICD-10-CM | POA: Diagnosis not present

## 2016-11-10 DIAGNOSIS — Z0000 Encounter for general adult medical examination without abnormal findings: Secondary | ICD-10-CM | POA: Diagnosis not present

## 2016-11-10 DIAGNOSIS — Z1389 Encounter for screening for other disorder: Secondary | ICD-10-CM | POA: Diagnosis not present

## 2016-11-10 DIAGNOSIS — Z79899 Other long term (current) drug therapy: Secondary | ICD-10-CM | POA: Diagnosis not present

## 2016-11-10 DIAGNOSIS — I10 Essential (primary) hypertension: Secondary | ICD-10-CM | POA: Diagnosis not present

## 2016-12-23 DIAGNOSIS — N401 Enlarged prostate with lower urinary tract symptoms: Secondary | ICD-10-CM | POA: Diagnosis not present

## 2016-12-23 DIAGNOSIS — R3912 Poor urinary stream: Secondary | ICD-10-CM | POA: Diagnosis not present

## 2016-12-23 DIAGNOSIS — R351 Nocturia: Secondary | ICD-10-CM | POA: Diagnosis not present

## 2016-12-23 DIAGNOSIS — R3915 Urgency of urination: Secondary | ICD-10-CM | POA: Diagnosis not present

## 2017-01-04 DIAGNOSIS — I6523 Occlusion and stenosis of bilateral carotid arteries: Secondary | ICD-10-CM | POA: Diagnosis not present

## 2017-01-04 DIAGNOSIS — Z9889 Other specified postprocedural states: Secondary | ICD-10-CM | POA: Diagnosis not present

## 2017-01-10 DIAGNOSIS — H43811 Vitreous degeneration, right eye: Secondary | ICD-10-CM | POA: Diagnosis not present

## 2017-01-10 DIAGNOSIS — Z961 Presence of intraocular lens: Secondary | ICD-10-CM | POA: Diagnosis not present

## 2017-01-10 DIAGNOSIS — H01022 Squamous blepharitis right lower eyelid: Secondary | ICD-10-CM | POA: Diagnosis not present

## 2017-01-10 DIAGNOSIS — H01024 Squamous blepharitis left upper eyelid: Secondary | ICD-10-CM | POA: Diagnosis not present

## 2017-01-10 DIAGNOSIS — H01025 Squamous blepharitis left lower eyelid: Secondary | ICD-10-CM | POA: Diagnosis not present

## 2017-01-10 DIAGNOSIS — H401132 Primary open-angle glaucoma, bilateral, moderate stage: Secondary | ICD-10-CM | POA: Diagnosis not present

## 2017-01-10 DIAGNOSIS — H04123 Dry eye syndrome of bilateral lacrimal glands: Secondary | ICD-10-CM | POA: Diagnosis not present

## 2017-01-10 DIAGNOSIS — H01021 Squamous blepharitis right upper eyelid: Secondary | ICD-10-CM | POA: Diagnosis not present

## 2017-02-02 DIAGNOSIS — Z952 Presence of prosthetic heart valve: Secondary | ICD-10-CM | POA: Diagnosis not present

## 2017-02-02 DIAGNOSIS — I2510 Atherosclerotic heart disease of native coronary artery without angina pectoris: Secondary | ICD-10-CM | POA: Diagnosis not present

## 2017-02-02 DIAGNOSIS — Z9889 Other specified postprocedural states: Secondary | ICD-10-CM | POA: Diagnosis not present

## 2017-02-02 DIAGNOSIS — F4323 Adjustment disorder with mixed anxiety and depressed mood: Secondary | ICD-10-CM | POA: Diagnosis not present

## 2017-02-16 DIAGNOSIS — Z6822 Body mass index (BMI) 22.0-22.9, adult: Secondary | ICD-10-CM | POA: Diagnosis not present

## 2017-02-16 DIAGNOSIS — I2510 Atherosclerotic heart disease of native coronary artery without angina pectoris: Secondary | ICD-10-CM | POA: Diagnosis not present

## 2017-02-16 DIAGNOSIS — Z299 Encounter for prophylactic measures, unspecified: Secondary | ICD-10-CM | POA: Diagnosis not present

## 2017-02-16 DIAGNOSIS — E039 Hypothyroidism, unspecified: Secondary | ICD-10-CM | POA: Diagnosis not present

## 2017-02-16 DIAGNOSIS — N400 Enlarged prostate without lower urinary tract symptoms: Secondary | ICD-10-CM | POA: Diagnosis not present

## 2017-02-16 DIAGNOSIS — I10 Essential (primary) hypertension: Secondary | ICD-10-CM | POA: Diagnosis not present

## 2017-02-16 DIAGNOSIS — E7800 Pure hypercholesterolemia, unspecified: Secondary | ICD-10-CM | POA: Diagnosis not present

## 2017-02-16 DIAGNOSIS — R5383 Other fatigue: Secondary | ICD-10-CM | POA: Diagnosis not present

## 2017-02-16 DIAGNOSIS — I6529 Occlusion and stenosis of unspecified carotid artery: Secondary | ICD-10-CM | POA: Diagnosis not present

## 2017-02-16 DIAGNOSIS — Z79899 Other long term (current) drug therapy: Secondary | ICD-10-CM | POA: Diagnosis not present

## 2017-03-03 DIAGNOSIS — R5381 Other malaise: Secondary | ICD-10-CM | POA: Diagnosis not present

## 2017-03-03 DIAGNOSIS — I2510 Atherosclerotic heart disease of native coronary artery without angina pectoris: Secondary | ICD-10-CM | POA: Diagnosis not present

## 2017-03-03 DIAGNOSIS — R5382 Chronic fatigue, unspecified: Secondary | ICD-10-CM | POA: Diagnosis not present

## 2017-03-03 DIAGNOSIS — Z952 Presence of prosthetic heart valve: Secondary | ICD-10-CM | POA: Diagnosis not present

## 2017-03-21 ENCOUNTER — Ambulatory Visit (INDEPENDENT_AMBULATORY_CARE_PROVIDER_SITE_OTHER): Payer: Medicare Other | Admitting: Pulmonary Disease

## 2017-03-21 ENCOUNTER — Encounter: Payer: Self-pay | Admitting: Pulmonary Disease

## 2017-03-21 VITALS — BP 140/80 | HR 75 | Ht 66.0 in | Wt 144.4 lb

## 2017-03-21 DIAGNOSIS — R29818 Other symptoms and signs involving the nervous system: Secondary | ICD-10-CM

## 2017-03-21 NOTE — Progress Notes (Signed)
Past Surgical History He  has a past surgical history that includes Cholecystectomy; Coronary artery bypass graft; Mitral valve repair (April 2010); TURP vaporization; Cataract extraction; nevus removed; ERCP; Inguinal hernia repair (01/19/2011); Wart fulguration (01/03/2012); Mitral valve repair (2010); Colonoscopy (N/A, 05/30/2013); and Carotid endarterectomy (Right, Nov. 2010).  Allergies  Allergen Reactions  . Ciprofloxacin Anaphylaxis and Nausea And Vomiting    High dose    Family History His family history includes Heart attack in his father.  Social History He  reports that he has never smoked. He has never used smokeless tobacco. He reports that he does not drink alcohol or use drugs.  Review of systems Constitutional: Negative for fever and unexpected weight change.  HENT: Negative for congestion, dental problem, ear pain, nosebleeds, postnasal drip, rhinorrhea, sinus pressure, sneezing, sore throat and trouble swallowing.   Eyes: Negative for redness and itching.  Respiratory: Positive for shortness of breath. Negative for cough, chest tightness and wheezing.   Cardiovascular: Negative for palpitations and leg swelling.  Gastrointestinal: Negative for nausea and vomiting.  Genitourinary: Negative for dysuria.  Musculoskeletal: Negative for joint swelling.  Skin: Negative for rash.  Allergic/Immunologic: Negative.  Negative for environmental allergies, food allergies and immunocompromised state.  Neurological: Positive for headaches.  Hematological: Does not bruise/bleed easily.  Psychiatric/Behavioral: Negative for dysphoric mood. The patient is not nervous/anxious.     Current Outpatient Prescriptions on File Prior to Visit  Medication Sig  . aspirin EC 81 MG tablet Take 81 mg by mouth daily.  . bimatoprost (LUMIGAN) 0.01 % SOLN Place 1 drop into both eyes at bedtime.  . carvedilol (COREG) 3.125 MG tablet Take by mouth 2 (two) times daily.  Marland Kitchen latanoprost (XALATAN) 0.005  % ophthalmic solution Place 1 drop into both eyes at bedtime.  Marland Kitchen levothyroxine (LEVOTHROID) 50 MCG tablet Take 50 mcg by mouth daily.    Marland Kitchen lisinopril (PRINIVIL,ZESTRIL) 10 MG tablet Take 5 mg by mouth daily. 10 mg  . pregabalin (LYRICA) 50 MG capsule Take 75 mg by mouth 2 (two) times daily.   . ranitidine (ZANTAC) 150 MG tablet Take 150 mg by mouth 2 (two) times daily.    . rosuvastatin (CRESTOR) 5 MG tablet Take 5 mg by mouth daily.    . silodosin (RAPAFLO) 8 MG CAPS capsule Take 8 mg by mouth daily with breakfast.  . vitamin B-12 (CYANOCOBALAMIN) 500 MCG tablet Take 500 mcg by mouth daily.     No current facility-administered medications on file prior to visit.     Chief Complaint  Patient presents with  . Sleep consult    Pt referred Dr. Einar Gip MD. Pt states it takes 57mins or more to fall asleep at night, wakes up 2 times each night to use restroom, and sleepy during the day.    Past medical history He  has a past medical history of CVA (cerebral infarction); Depression; GERD (gastroesophageal reflux disease); Heart murmur; Hemorrhoids; Hernia; History of BPH; Hyperlipemia; Hypertension; Hypothyroidism; Mitral regurgitation; Status post coronary artery bypass grafting; Stroke [facility]) (Oct 2010); and Thyroid disease.  Vital signs BP 140/80 (BP Location: Left Arm, Cuff Size: Normal)   Pulse 75   Ht 5\' 6"  (1.676 m)   Wt 144 lb 6.4 oz (65.5 kg)   SpO2 97%   BMI 23.31 kg/m   History of Present Illness [name] is a 82 y.o. male for evaluation of sleep problems.  He is followed by Dr. Einar Gip for CAD and hypertension.  He also has history of  CVA.  He has been getting more fatigued during the day.  His wife has said that he snores.  There was concern he could have sleep apnea.  He goes to sleep at 11 pm.  He falls asleep after 30 minutes.  He wakes up 2 times to use the bathroom.  He gets out of bed at 830 am.  He feels tired in the morning.  He meditates for 2 hours.  He then  will sometimes take a nap for another hour.  He denies morning headache.  He does not use anything to help him fall sleep.  He drinks tea in the morning, but never after noon.  He denies sleep walking, sleep talking, bruxism, or nightmares.  There is no history of restless legs.  He denies sleep hallucinations, sleep paralysis, or cataplexy.  The Epworth score is 10 out of 24.   Physical Exam:  General - No distress Eyes - pupils reactive ENT - No sinus tenderness, no oral exudate, no LAN, no thyromegaly, TM clear, pupils equal/reactive Cardiac - s1s2 regular, no murmur, pulses symmetric Chest - No wheeze/rales/dullness, good air entry, normal respiratory excursion Back - No focal tenderness Abd - Soft, non-tender, no organomegaly, + bowel sounds Ext - No edema Neuro - Normal strength, cranial nerves intact Skin - No rashes Psych - Normal mood, and behavior  Discussion: He has snoring, sleep disruption, apnea, and daytime sleepiness.  He has hx of CAD, CVA, and hypertension.  I am concerned he could have sleep apnea.  We discussed how sleep apnea can affect various health problems, including risks for hypertension, cardiovascular disease, and diabetes.  We also discussed how sleep disruption can increase risks for accidents, such as while driving.  Weight loss as a means of improving sleep apnea was also reviewed.  Additional treatment options discussed were CPAP therapy, oral appliance, and surgical intervention.  Assessment/plan:  Suspected sleep apnea. - will arrange for home sleep study to further assess   Patient Instructions  Will arrange for home sleep study Will call to arrange for follow up after sleep study reviewed    Chesley Mires, M.D. Pager [phone] 03/21/2017, 2:43 PM

## 2017-03-21 NOTE — Progress Notes (Signed)
   Subjective:    Patient ID: [name], male    DOB: 19-May-1935, 81 y.o.   MRN: [id number]  HPI    Review of Systems  Constitutional: Negative for fever and unexpected weight change.  HENT: Negative for congestion, dental problem, ear pain, nosebleeds, postnasal drip, rhinorrhea, sinus pressure, sneezing, sore throat and trouble swallowing.   Eyes: Negative for redness and itching.  Respiratory: Positive for shortness of breath. Negative for cough, chest tightness and wheezing.   Cardiovascular: Negative for palpitations and leg swelling.  Gastrointestinal: Negative for nausea and vomiting.  Genitourinary: Negative for dysuria.  Musculoskeletal: Negative for joint swelling.  Skin: Negative for rash.  Allergic/Immunologic: Negative.  Negative for environmental allergies, food allergies and immunocompromised state.  Neurological: Positive for headaches.  Hematological: Does not bruise/bleed easily.  Psychiatric/Behavioral: Negative for dysphoric mood. The patient is not nervous/anxious.        Objective:   Physical Exam        Assessment & Plan:

## 2017-03-21 NOTE — Patient Instructions (Signed)
Will arrange for home sleep study Will call to arrange for follow up after sleep study reviewed  

## 2017-04-18 DIAGNOSIS — G4733 Obstructive sleep apnea (adult) (pediatric): Secondary | ICD-10-CM | POA: Diagnosis not present

## 2017-04-19 DIAGNOSIS — G4733 Obstructive sleep apnea (adult) (pediatric): Secondary | ICD-10-CM | POA: Diagnosis not present

## 2017-04-21 ENCOUNTER — Telehealth: Payer: Self-pay | Admitting: Pulmonary Disease

## 2017-04-21 DIAGNOSIS — G4733 Obstructive sleep apnea (adult) (pediatric): Secondary | ICD-10-CM

## 2017-04-21 NOTE — Telephone Encounter (Signed)
HST 04/18/17 >> AHI 19.8, SaO2 low 74%.   Will have my nurse inform pt that sleep study shows moderate sleep apnea.  Options are 1) CPAP now, 2) ROV first.  If He is agreeable to CPAP, then please send order for auto CPAP range 5 to 15 cm H2O with heated humidity and mask of choice.  Have download sent 1 month after starting CPAP and set up ROV 2 months after starting CPAP.  Please schedule ROV with me.

## 2017-04-22 ENCOUNTER — Other Ambulatory Visit: Payer: Self-pay | Admitting: *Deleted

## 2017-04-22 DIAGNOSIS — R29818 Other symptoms and signs involving the nervous system: Secondary | ICD-10-CM

## 2017-04-27 NOTE — Telephone Encounter (Signed)
Called and spoke with patient today regarding results per vs.  Informed the patient of his results today and recommendations per vs. The patient verbalized understanding and denied any questions or concerns at this time. Pt scheduled ROV for Monday May 02 2017 to go over results.

## 2017-05-02 ENCOUNTER — Telehealth: Payer: Self-pay | Admitting: Pulmonary Disease

## 2017-05-02 ENCOUNTER — Ambulatory Visit (INDEPENDENT_AMBULATORY_CARE_PROVIDER_SITE_OTHER): Payer: Medicare Other | Admitting: Pulmonary Disease

## 2017-05-02 ENCOUNTER — Encounter: Payer: Self-pay | Admitting: Pulmonary Disease

## 2017-05-02 VITALS — BP 114/80 | HR 77 | Ht 66.0 in | Wt 144.4 lb

## 2017-05-02 DIAGNOSIS — G4733 Obstructive sleep apnea (adult) (pediatric): Secondary | ICD-10-CM | POA: Diagnosis not present

## 2017-05-02 NOTE — Telephone Encounter (Signed)
Called pt and told him [facility] does not do cpap machines I looked up the address and [facility] in [place] is the closest

## 2017-05-02 NOTE — Telephone Encounter (Signed)
PCC's wanted this information. Will route

## 2017-05-02 NOTE — Patient Instructions (Signed)
Will arrange for CPAP set up  Follow up in 3 months

## 2017-05-02 NOTE — Progress Notes (Signed)
Current Outpatient Medications on File Prior to Visit  Medication Sig  . aspirin EC 81 MG tablet Take 81 mg by mouth daily.  . bimatoprost (LUMIGAN) 0.01 % SOLN Place 1 drop into both eyes at bedtime.  Marland Kitchen levothyroxine (LEVOTHROID) 50 MCG tablet Take 50 mcg by mouth daily.    Marland Kitchen losartan (COZAAR) 25 MG tablet Take 25 mg daily by mouth.  . pregabalin (LYRICA) 50 MG capsule Take 75 mg by mouth 2 (two) times daily.   . ranitidine (ZANTAC) 150 MG tablet Take 150 mg by mouth 2 (two) times daily.    . rosuvastatin (CRESTOR) 5 MG tablet Take 5 mg by mouth daily.    . silodosin (RAPAFLO) 8 MG CAPS capsule Take 8 mg by mouth daily with breakfast.  . vitamin B-12 (CYANOCOBALAMIN) 500 MCG tablet Take 500 mcg by mouth daily.     No current facility-administered medications on file prior to visit.      Chief Complaint  Patient presents with  . Follow-up    Pt is here for sleep study results, and has questions about the study     Tests HST 04/18/17 >> AHI 19.8, SaO2 low 74%.  Past medical history CVA, Depression, GERD, BPH, HLD, HTN, Hypothyroidism, MR s/p MVR, CAD s/p CABG  Past surgical history, Family history, Social history, Allergies all reviewed.  Vital Signs BP 114/80 (BP Location: Left Arm, Cuff Size: Normal)   Pulse 77   Ht 5\' 6"  (1.676 m)   Wt 144 lb 6.4 oz (65.5 kg)   SpO2 100%   BMI 23.31 kg/m   History of Present Illness [name] is a 82 y.o. male with obstructive sleep apnea.  He is here to review his sleep study.  This showed moderate obstructive sleep apnea.  Physical Exam  General - No distress ENT - No sinus tenderness, no oral exudate, no LAN Cardiac - s1s2 regular, no murmur Chest - No wheeze/rales/dullness Back - No focal tenderness Abd - Soft, non-tender Ext - No edema Neuro - Normal strength Skin - No rashes Psych - normal mood, and behavior   Assessment/Plan  Obstructive sleep apnea. - We discussed how sleep apnea can affect various  health problems, including risks for hypertension, cardiovascular disease, and diabetes.  We also discussed how sleep disruption can increase risks for accidents, such as while driving.  Weight loss as a means of improving sleep apnea was also reviewed.  Additional treatment options discussed were CPAP therapy, oral appliance, and surgical intervention. - he is agreeable to try CPAP - will arrange for auto CPAP set up  Patient Instructions  Will arrange for CPAP set up  Follow up in 3 months   Time spent 23 minutes  Chesley Mires, MD [facility] Pager:  [phone] 05/02/2017, 12:25 PM

## 2017-05-17 DIAGNOSIS — H401132 Primary open-angle glaucoma, bilateral, moderate stage: Secondary | ICD-10-CM | POA: Diagnosis not present

## 2017-05-17 DIAGNOSIS — H01022 Squamous blepharitis right lower eyelid: Secondary | ICD-10-CM | POA: Diagnosis not present

## 2017-05-17 DIAGNOSIS — H01021 Squamous blepharitis right upper eyelid: Secondary | ICD-10-CM | POA: Diagnosis not present

## 2017-05-17 DIAGNOSIS — Z961 Presence of intraocular lens: Secondary | ICD-10-CM | POA: Diagnosis not present

## 2017-05-17 DIAGNOSIS — H04123 Dry eye syndrome of bilateral lacrimal glands: Secondary | ICD-10-CM | POA: Diagnosis not present

## 2017-05-17 DIAGNOSIS — H01024 Squamous blepharitis left upper eyelid: Secondary | ICD-10-CM | POA: Diagnosis not present

## 2017-05-17 DIAGNOSIS — H43811 Vitreous degeneration, right eye: Secondary | ICD-10-CM | POA: Diagnosis not present

## 2017-05-17 DIAGNOSIS — H01025 Squamous blepharitis left lower eyelid: Secondary | ICD-10-CM | POA: Diagnosis not present

## 2017-05-30 DIAGNOSIS — N400 Enlarged prostate without lower urinary tract symptoms: Secondary | ICD-10-CM | POA: Diagnosis not present

## 2017-05-30 DIAGNOSIS — E039 Hypothyroidism, unspecified: Secondary | ICD-10-CM | POA: Diagnosis not present

## 2017-05-30 DIAGNOSIS — Z6822 Body mass index (BMI) 22.0-22.9, adult: Secondary | ICD-10-CM | POA: Diagnosis not present

## 2017-05-30 DIAGNOSIS — R5383 Other fatigue: Secondary | ICD-10-CM | POA: Diagnosis not present

## 2017-05-30 DIAGNOSIS — Z299 Encounter for prophylactic measures, unspecified: Secondary | ICD-10-CM | POA: Diagnosis not present

## 2017-05-30 DIAGNOSIS — I2510 Atherosclerotic heart disease of native coronary artery without angina pectoris: Secondary | ICD-10-CM | POA: Diagnosis not present

## 2017-05-30 DIAGNOSIS — I6529 Occlusion and stenosis of unspecified carotid artery: Secondary | ICD-10-CM | POA: Diagnosis not present

## 2017-05-30 DIAGNOSIS — E7800 Pure hypercholesterolemia, unspecified: Secondary | ICD-10-CM | POA: Diagnosis not present

## 2017-05-30 DIAGNOSIS — K219 Gastro-esophageal reflux disease without esophagitis: Secondary | ICD-10-CM | POA: Diagnosis not present

## 2017-05-30 DIAGNOSIS — G4730 Sleep apnea, unspecified: Secondary | ICD-10-CM | POA: Diagnosis not present

## 2017-05-30 DIAGNOSIS — L910 Hypertrophic scar: Secondary | ICD-10-CM | POA: Diagnosis not present

## 2017-05-30 DIAGNOSIS — I10 Essential (primary) hypertension: Secondary | ICD-10-CM | POA: Diagnosis not present

## 2017-08-10 DIAGNOSIS — D485 Neoplasm of uncertain behavior of skin: Secondary | ICD-10-CM | POA: Diagnosis not present

## 2017-08-10 DIAGNOSIS — L910 Hypertrophic scar: Secondary | ICD-10-CM | POA: Diagnosis not present

## 2017-08-31 DIAGNOSIS — I10 Essential (primary) hypertension: Secondary | ICD-10-CM | POA: Diagnosis not present

## 2017-08-31 DIAGNOSIS — Z952 Presence of prosthetic heart valve: Secondary | ICD-10-CM | POA: Diagnosis not present

## 2017-08-31 DIAGNOSIS — E7800 Pure hypercholesterolemia, unspecified: Secondary | ICD-10-CM | POA: Diagnosis not present

## 2017-08-31 DIAGNOSIS — I2510 Atherosclerotic heart disease of native coronary artery without angina pectoris: Secondary | ICD-10-CM | POA: Diagnosis not present

## 2017-09-09 DIAGNOSIS — L910 Hypertrophic scar: Secondary | ICD-10-CM | POA: Diagnosis not present

## 2017-09-20 DIAGNOSIS — Z961 Presence of intraocular lens: Secondary | ICD-10-CM | POA: Diagnosis not present

## 2017-09-20 DIAGNOSIS — H04123 Dry eye syndrome of bilateral lacrimal glands: Secondary | ICD-10-CM | POA: Diagnosis not present

## 2017-09-20 DIAGNOSIS — H43811 Vitreous degeneration, right eye: Secondary | ICD-10-CM | POA: Diagnosis not present

## 2017-09-20 DIAGNOSIS — H01021 Squamous blepharitis right upper eyelid: Secondary | ICD-10-CM | POA: Diagnosis not present

## 2017-09-20 DIAGNOSIS — H01022 Squamous blepharitis right lower eyelid: Secondary | ICD-10-CM | POA: Diagnosis not present

## 2017-09-20 DIAGNOSIS — H01024 Squamous blepharitis left upper eyelid: Secondary | ICD-10-CM | POA: Diagnosis not present

## 2017-09-20 DIAGNOSIS — H01025 Squamous blepharitis left lower eyelid: Secondary | ICD-10-CM | POA: Diagnosis not present

## 2017-09-20 DIAGNOSIS — H401132 Primary open-angle glaucoma, bilateral, moderate stage: Secondary | ICD-10-CM | POA: Diagnosis not present

## 2017-09-27 DIAGNOSIS — L910 Hypertrophic scar: Secondary | ICD-10-CM | POA: Diagnosis not present

## 2017-09-27 DIAGNOSIS — E7800 Pure hypercholesterolemia, unspecified: Secondary | ICD-10-CM | POA: Diagnosis not present

## 2017-09-27 DIAGNOSIS — G4730 Sleep apnea, unspecified: Secondary | ICD-10-CM | POA: Diagnosis not present

## 2017-09-27 DIAGNOSIS — I10 Essential (primary) hypertension: Secondary | ICD-10-CM | POA: Diagnosis not present

## 2017-09-27 DIAGNOSIS — Z299 Encounter for prophylactic measures, unspecified: Secondary | ICD-10-CM | POA: Diagnosis not present

## 2017-09-27 DIAGNOSIS — I2510 Atherosclerotic heart disease of native coronary artery without angina pectoris: Secondary | ICD-10-CM | POA: Diagnosis not present

## 2017-09-27 DIAGNOSIS — M25562 Pain in left knee: Secondary | ICD-10-CM | POA: Diagnosis not present

## 2017-09-27 DIAGNOSIS — E039 Hypothyroidism, unspecified: Secondary | ICD-10-CM | POA: Diagnosis not present

## 2017-09-27 DIAGNOSIS — Z6821 Body mass index (BMI) 21.0-21.9, adult: Secondary | ICD-10-CM | POA: Diagnosis not present

## 2017-12-23 DIAGNOSIS — Z6821 Body mass index (BMI) 21.0-21.9, adult: Secondary | ICD-10-CM | POA: Diagnosis not present

## 2017-12-23 DIAGNOSIS — Z1211 Encounter for screening for malignant neoplasm of colon: Secondary | ICD-10-CM | POA: Diagnosis not present

## 2017-12-23 DIAGNOSIS — R5383 Other fatigue: Secondary | ICD-10-CM | POA: Diagnosis not present

## 2017-12-23 DIAGNOSIS — E039 Hypothyroidism, unspecified: Secondary | ICD-10-CM | POA: Diagnosis not present

## 2017-12-23 DIAGNOSIS — Z1339 Encounter for screening examination for other mental health and behavioral disorders: Secondary | ICD-10-CM | POA: Diagnosis not present

## 2017-12-23 DIAGNOSIS — G4730 Sleep apnea, unspecified: Secondary | ICD-10-CM | POA: Diagnosis not present

## 2017-12-23 DIAGNOSIS — Z789 Other specified health status: Secondary | ICD-10-CM | POA: Diagnosis not present

## 2017-12-23 DIAGNOSIS — I10 Essential (primary) hypertension: Secondary | ICD-10-CM | POA: Diagnosis not present

## 2017-12-23 DIAGNOSIS — Z125 Encounter for screening for malignant neoplasm of prostate: Secondary | ICD-10-CM | POA: Diagnosis not present

## 2017-12-23 DIAGNOSIS — Z79899 Other long term (current) drug therapy: Secondary | ICD-10-CM | POA: Diagnosis not present

## 2017-12-23 DIAGNOSIS — Z7189 Other specified counseling: Secondary | ICD-10-CM | POA: Diagnosis not present

## 2017-12-23 DIAGNOSIS — E7800 Pure hypercholesterolemia, unspecified: Secondary | ICD-10-CM | POA: Diagnosis not present

## 2017-12-23 DIAGNOSIS — Z299 Encounter for prophylactic measures, unspecified: Secondary | ICD-10-CM | POA: Diagnosis not present

## 2017-12-23 DIAGNOSIS — Z0000 Encounter for general adult medical examination without abnormal findings: Secondary | ICD-10-CM | POA: Diagnosis not present

## 2017-12-23 DIAGNOSIS — Z1331 Encounter for screening for depression: Secondary | ICD-10-CM | POA: Diagnosis not present

## 2018-01-16 DIAGNOSIS — R351 Nocturia: Secondary | ICD-10-CM | POA: Diagnosis not present

## 2018-01-16 DIAGNOSIS — N401 Enlarged prostate with lower urinary tract symptoms: Secondary | ICD-10-CM | POA: Diagnosis not present

## 2018-01-16 DIAGNOSIS — Z87440 Personal history of urinary (tract) infections: Secondary | ICD-10-CM | POA: Diagnosis not present

## 2018-01-20 DIAGNOSIS — I10 Essential (primary) hypertension: Secondary | ICD-10-CM | POA: Diagnosis not present

## 2018-01-20 DIAGNOSIS — R739 Hyperglycemia, unspecified: Secondary | ICD-10-CM | POA: Diagnosis not present

## 2018-01-20 DIAGNOSIS — E7800 Pure hypercholesterolemia, unspecified: Secondary | ICD-10-CM | POA: Diagnosis not present

## 2018-01-20 DIAGNOSIS — Z299 Encounter for prophylactic measures, unspecified: Secondary | ICD-10-CM | POA: Diagnosis not present

## 2018-01-20 DIAGNOSIS — E039 Hypothyroidism, unspecified: Secondary | ICD-10-CM | POA: Diagnosis not present

## 2018-01-20 DIAGNOSIS — Z6821 Body mass index (BMI) 21.0-21.9, adult: Secondary | ICD-10-CM | POA: Diagnosis not present

## 2018-01-25 DIAGNOSIS — H01021 Squamous blepharitis right upper eyelid: Secondary | ICD-10-CM | POA: Diagnosis not present

## 2018-01-25 DIAGNOSIS — H26493 Other secondary cataract, bilateral: Secondary | ICD-10-CM | POA: Diagnosis not present

## 2018-01-25 DIAGNOSIS — H401132 Primary open-angle glaucoma, bilateral, moderate stage: Secondary | ICD-10-CM | POA: Diagnosis not present

## 2018-01-25 DIAGNOSIS — H04123 Dry eye syndrome of bilateral lacrimal glands: Secondary | ICD-10-CM | POA: Diagnosis not present

## 2018-01-25 DIAGNOSIS — Z961 Presence of intraocular lens: Secondary | ICD-10-CM | POA: Diagnosis not present

## 2018-01-25 DIAGNOSIS — H01025 Squamous blepharitis left lower eyelid: Secondary | ICD-10-CM | POA: Diagnosis not present

## 2018-01-25 DIAGNOSIS — H01024 Squamous blepharitis left upper eyelid: Secondary | ICD-10-CM | POA: Diagnosis not present

## 2018-01-25 DIAGNOSIS — H43811 Vitreous degeneration, right eye: Secondary | ICD-10-CM | POA: Diagnosis not present

## 2018-01-25 DIAGNOSIS — H01022 Squamous blepharitis right lower eyelid: Secondary | ICD-10-CM | POA: Diagnosis not present

## 2018-03-28 DIAGNOSIS — Z952 Presence of prosthetic heart valve: Secondary | ICD-10-CM | POA: Diagnosis not present

## 2018-03-28 DIAGNOSIS — E7800 Pure hypercholesterolemia, unspecified: Secondary | ICD-10-CM | POA: Diagnosis not present

## 2018-03-28 DIAGNOSIS — I2510 Atherosclerotic heart disease of native coronary artery without angina pectoris: Secondary | ICD-10-CM | POA: Diagnosis not present

## 2018-03-28 DIAGNOSIS — I10 Essential (primary) hypertension: Secondary | ICD-10-CM | POA: Diagnosis not present

## 2018-03-31 DIAGNOSIS — Z6821 Body mass index (BMI) 21.0-21.9, adult: Secondary | ICD-10-CM | POA: Diagnosis not present

## 2018-03-31 DIAGNOSIS — I10 Essential (primary) hypertension: Secondary | ICD-10-CM | POA: Diagnosis not present

## 2018-03-31 DIAGNOSIS — Z299 Encounter for prophylactic measures, unspecified: Secondary | ICD-10-CM | POA: Diagnosis not present

## 2018-03-31 DIAGNOSIS — I6529 Occlusion and stenosis of unspecified carotid artery: Secondary | ICD-10-CM | POA: Diagnosis not present

## 2018-03-31 DIAGNOSIS — I2510 Atherosclerotic heart disease of native coronary artery without angina pectoris: Secondary | ICD-10-CM | POA: Diagnosis not present

## 2018-05-30 DIAGNOSIS — H26493 Other secondary cataract, bilateral: Secondary | ICD-10-CM | POA: Diagnosis not present

## 2018-05-30 DIAGNOSIS — H43811 Vitreous degeneration, right eye: Secondary | ICD-10-CM | POA: Diagnosis not present

## 2018-05-30 DIAGNOSIS — H01025 Squamous blepharitis left lower eyelid: Secondary | ICD-10-CM | POA: Diagnosis not present

## 2018-05-30 DIAGNOSIS — Z961 Presence of intraocular lens: Secondary | ICD-10-CM | POA: Diagnosis not present

## 2018-05-30 DIAGNOSIS — H01024 Squamous blepharitis left upper eyelid: Secondary | ICD-10-CM | POA: Diagnosis not present

## 2018-05-30 DIAGNOSIS — H04123 Dry eye syndrome of bilateral lacrimal glands: Secondary | ICD-10-CM | POA: Diagnosis not present

## 2018-05-30 DIAGNOSIS — H401132 Primary open-angle glaucoma, bilateral, moderate stage: Secondary | ICD-10-CM | POA: Diagnosis not present

## 2018-05-30 DIAGNOSIS — H01021 Squamous blepharitis right upper eyelid: Secondary | ICD-10-CM | POA: Diagnosis not present

## 2018-05-30 DIAGNOSIS — H01022 Squamous blepharitis right lower eyelid: Secondary | ICD-10-CM | POA: Diagnosis not present

## 2018-06-20 DIAGNOSIS — N3000 Acute cystitis without hematuria: Secondary | ICD-10-CM | POA: Diagnosis not present

## 2018-06-20 DIAGNOSIS — N3001 Acute cystitis with hematuria: Secondary | ICD-10-CM | POA: Diagnosis not present

## 2018-07-31 DIAGNOSIS — N3943 Post-void dribbling: Secondary | ICD-10-CM | POA: Diagnosis not present

## 2018-07-31 DIAGNOSIS — N401 Enlarged prostate with lower urinary tract symptoms: Secondary | ICD-10-CM | POA: Diagnosis not present

## 2018-07-31 DIAGNOSIS — R351 Nocturia: Secondary | ICD-10-CM | POA: Diagnosis not present

## 2018-08-09 DIAGNOSIS — I2510 Atherosclerotic heart disease of native coronary artery without angina pectoris: Secondary | ICD-10-CM | POA: Diagnosis not present

## 2018-08-09 DIAGNOSIS — I6529 Occlusion and stenosis of unspecified carotid artery: Secondary | ICD-10-CM | POA: Diagnosis not present

## 2018-08-09 DIAGNOSIS — Z6821 Body mass index (BMI) 21.0-21.9, adult: Secondary | ICD-10-CM | POA: Diagnosis not present

## 2018-08-09 DIAGNOSIS — Z789 Other specified health status: Secondary | ICD-10-CM | POA: Diagnosis not present

## 2018-08-09 DIAGNOSIS — Z299 Encounter for prophylactic measures, unspecified: Secondary | ICD-10-CM | POA: Diagnosis not present

## 2018-08-09 DIAGNOSIS — I10 Essential (primary) hypertension: Secondary | ICD-10-CM | POA: Diagnosis not present

## 2018-09-11 ENCOUNTER — Other Ambulatory Visit: Payer: Self-pay

## 2018-09-11 ENCOUNTER — Telehealth: Payer: Self-pay

## 2018-09-11 DIAGNOSIS — Z951 Presence of aortocoronary bypass graft: Secondary | ICD-10-CM

## 2018-09-11 MED ORDER — CARVEDILOL 12.5 MG PO TABS: 12.5000 mg | ORAL_TABLET | Freq: Two times a day (BID) | ORAL | 1 refills | Status: DC

## 2018-09-11 NOTE — Telephone Encounter (Signed)
Discontinue Metoprolol succinate and lets try Carvedilol 12.5 mg BID and to call us in 1 week.

## 2018-09-11 NOTE — Telephone Encounter (Signed)
Pt called c/o of bp being high of 937 for systolic; Pt is taking amlodipine 10mg  QD; Losartan 25mg  QD; Metoprolol 25mg  QD; He wants to know if maybe you can change his meds or what do you suggest?

## 2018-11-13 ENCOUNTER — Telehealth (INDEPENDENT_AMBULATORY_CARE_PROVIDER_SITE_OTHER): Payer: Medicare Other

## 2018-11-13 DIAGNOSIS — I10 Essential (primary) hypertension: Secondary | ICD-10-CM

## 2018-11-13 DIAGNOSIS — Z299 Encounter for prophylactic measures, unspecified: Secondary | ICD-10-CM | POA: Diagnosis not present

## 2018-11-13 DIAGNOSIS — Z6829 Body mass index (BMI) 29.0-29.9, adult: Secondary | ICD-10-CM | POA: Diagnosis not present

## 2018-11-13 DIAGNOSIS — R5383 Other fatigue: Secondary | ICD-10-CM | POA: Diagnosis not present

## 2018-11-13 DIAGNOSIS — Z951 Presence of aortocoronary bypass graft: Secondary | ICD-10-CM

## 2018-11-13 DIAGNOSIS — E039 Hypothyroidism, unspecified: Secondary | ICD-10-CM | POA: Diagnosis not present

## 2018-11-13 MED ORDER — CARVEDILOL 12.5 MG PO TABS: 12.5000 mg | ORAL_TABLET | Freq: Two times a day (BID) | ORAL | 2 refills | Status: DC

## 2018-11-13 MED ORDER — HYDRALAZINE HCL 25 MG PO TABS: 25.0000 mg | ORAL_TABLET | Freq: Three times a day (TID) | ORAL | 3 refills | Status: DC

## 2018-11-13 NOTE — Telephone Encounter (Signed)
Pt called and wants to know if he should cont carvedilol; He stated that his bp readings have been ok sometimes mid 140, but usually 130's; He wants to know why his carvedilol was changed to 12.5mg ; Hes not saying he doesn't trust you he just wants to know; If you can give him a call [phone]

## 2018-11-13 NOTE — Telephone Encounter (Signed)
Patient called [place] regarding elevated blood pressure, most times the blood pressure has been less than 098 mmHg systolic but at times has been greater than 150 mmHg.  Diastolic blood pressure has been relatively stable and normal around less than 80 mmHg.  He has been concerned about this.  We discussed regarding hypertension management, I prescribed him hydralazine 25 mg p.o. 3 times daily but he could certainly hold it if blood pressure is less than 140/80 mmHg.  Patient also has underlying bradycardia hence I did not want to increase the carvedilol to 25 mg twice daily.  This was a 20-minute telephone encounter with greater than 50% time spent with counseling regarding hypertension, management of blood pressure and evaluation of symptoms associated with elevated blood pressure.  I will also set him up for an office visit in 4 to 6 weeks for follow-up of hypertension.

## 2018-11-16 ENCOUNTER — Other Ambulatory Visit: Payer: Self-pay

## 2018-11-16 DIAGNOSIS — I10 Essential (primary) hypertension: Secondary | ICD-10-CM

## 2018-11-16 MED ORDER — AMLODIPINE BESYLATE 10 MG PO TABS: 10.0000 mg | ORAL_TABLET | Freq: Every day | ORAL | 3 refills | Status: DC

## 2018-11-16 NOTE — Telephone Encounter (Signed)
Pt requesting refill on Amlodipine which is not on current med list. Is he supposed to be on this. Ok to fill?

## 2018-11-16 NOTE — Telephone Encounter (Signed)
Send to [facility]

## 2018-12-12 ENCOUNTER — Encounter: Payer: Self-pay | Admitting: Cardiology

## 2018-12-12 ENCOUNTER — Other Ambulatory Visit: Payer: Self-pay

## 2018-12-12 ENCOUNTER — Ambulatory Visit (INDEPENDENT_AMBULATORY_CARE_PROVIDER_SITE_OTHER): Payer: Medicare Other | Admitting: Cardiology

## 2018-12-12 VITALS — BP 146/82 | HR 57 | Ht 66.0 in | Wt 145.0 lb

## 2018-12-12 DIAGNOSIS — N183 Chronic kidney disease, stage 3 unspecified: Secondary | ICD-10-CM

## 2018-12-12 DIAGNOSIS — I2510 Atherosclerotic heart disease of native coronary artery without angina pectoris: Secondary | ICD-10-CM

## 2018-12-12 DIAGNOSIS — Z9889 Other specified postprocedural states: Secondary | ICD-10-CM

## 2018-12-12 DIAGNOSIS — I129 Hypertensive chronic kidney disease with stage 1 through stage 4 chronic kidney disease, or unspecified chronic kidney disease: Secondary | ICD-10-CM

## 2018-12-12 DIAGNOSIS — I10 Essential (primary) hypertension: Secondary | ICD-10-CM

## 2018-12-12 NOTE — Progress Notes (Signed)
Primary Physician/Referring:  Glenda Chroman, MD  Patient ID: [name], male    DOB: 1934/09/18, 84 y.o.   MRN: [id number]  Chief Complaint  Patient presents with  . Coronary Artery Disease  . Follow-up    HPI: [name]  is a 84 y.o. male  coronary artery disease, history of 1 vessel CABG with LIMA to LAD and mitral valve repair done in [place], in May 2010, history of stroke due to symptomatic right carotid artery stenosis in September 2010, with right carotid endarterectomy in November 2010 with no residual stenosis.  This is his 6 month office visit for f/u of hypertension, CAD, MV repair.  He was diagnosed with sleep apnea in 2018 but did not to use CPAP.  His main complaint today is fatigue and early morning lethargy.  He is also noticed his blood pressure has improved since being on hydralazine but has been taking 25 mg twice daily.  No chest pain or palpitations.  Past Medical History:  Diagnosis Date  . CVA (cerebral infarction)    Significant carotid artery disease status post right carotid endarterectomy, postoperatively  . Depression   . GERD (gastroesophageal reflux disease)   . Heart murmur   . Hemorrhoids   . Hernia   . History of BPH   . Hyperlipemia   . Hypertension   . Hypothyroidism   . Mitral regurgitation     status post mitral valve repair at [facility] in [place]  . Status post coronary artery bypass grafting    Single-vessel coronary bypass grafting at time of mitral valve repair  . Stroke [facility]) Oct 2010  . Thyroid disease    hypothyroidism    Past Surgical History:  Procedure Laterality Date  . CAROTID ENDARTERECTOMY Right Nov. 2010   CEA  . CATARACT EXTRACTION    . CHOLECYSTECTOMY    . COLONOSCOPY N/A 05/30/2013   Procedure: COLONOSCOPY;  Surgeon: Rogene Houston, MD;  Location: [facility];  Service: Endoscopy;  Laterality: N/A;  1230-rescheduled to 12:00 Ann notified pt  . CORONARY ARTERY BYPASS GRAFT    .  ERCP     with biliary and pancreatic stenting  . INGUINAL HERNIA REPAIR  01/19/2011   Bilateral  . MITRAL VALVE REPAIR  April 2010  . MITRAL VALVE REPAIR  2010  . nevus removed    . TURP VAPORIZATION    . WART FULGURATION  01/03/2012   Procedure: FULGURATION ANAL WART;  Surgeon: Zenovia Jarred, MD;  Location: [place];  Service: General;  Laterality: N/A;  excision perianal condylomata    Social History   Socioeconomic History  . Marital status: Married    Spouse name: Not on file  . Number of children: 2  . Years of education: Not on file  . Highest education level: Not on file  Occupational History  . Not on file  Social Needs  . Financial resource strain: Not on file  . Food insecurity    Worry: Not on file    Inability: Not on file  . Transportation needs    Medical: Not on file    Non-medical: Not on file  Tobacco Use  . Smoking status: Never Smoker  . Smokeless tobacco: Never Used  Substance and Sexual Activity  . Alcohol use: No  . Drug use: No  . Sexual activity: Not on file  Lifestyle  . Physical activity    Days per week: Not on  file    Minutes per session: Not on file  . Stress: Not on file  Relationships  . Social Herbalist on phone: Not on file    Gets together: Not on file    Attends religious service: Not on file    Active member of club or organization: Not on file    Attends meetings of clubs or organizations: Not on file    Relationship status: Not on file  . Intimate partner violence    Fear of current or ex partner: Not on file    Emotionally abused: Not on file    Physically abused: Not on file    Forced sexual activity: Not on file  Other Topics Concern  . Not on file  Social History Narrative  . Not on file   Review of Systems  Constitution: Positive for malaise/fatigue (in the morning). Negative for chills, decreased appetite and weight gain.  Cardiovascular: Negative for dyspnea on exertion, leg  swelling and syncope.  Respiratory: Positive for snoring.   Endocrine: Negative for cold intolerance.  Hematologic/Lymphatic: Does not bruise/bleed easily.  Musculoskeletal: Negative for joint swelling.  Gastrointestinal: Negative for abdominal pain, anorexia, change in bowel habit, hematochezia and melena.  Neurological: Negative for headaches and light-headedness.  Psychiatric/Behavioral: Positive for depression. Negative for substance abuse. The patient is nervous/anxious.   All other systems reviewed and are negative.   Objective  Blood pressure (!) 146/82, pulse (!) 57, height 5\' 6"  (1.676 m), weight 145 lb (65.8 kg). Body mass index is 23.4 kg/m.    Physical Exam  Constitutional: He appears well-developed and well-nourished. No distress.  HENT:  Head: Atraumatic.  Eyes: Conjunctivae are normal.  Neck: Neck supple. No JVD present. No thyromegaly present.  Cardiovascular: Normal rate, regular rhythm, intact distal pulses and normal pulses. Exam reveals no gallop.  Murmur heard.  Early systolic murmur is present with a grade of 2/6 at the upper right sternal border radiating to the neck. Pulses:      Carotid pulses are on the right side with bruit and on the left side with bruit.      Femoral pulses are 2+ on the right side and 2+ on the left side.      Popliteal pulses are 2+ on the right side and 2+ on the left side.       Dorsalis pedis pulses are 2+ on the right side and 2+ on the left side.       Posterior tibial pulses are 2+ on the right side and 2+ on the left side.  No edema, no JVD  Pulmonary/Chest: Effort normal and breath sounds normal.  Abdominal: Soft. Bowel sounds are normal.  Musculoskeletal: Normal range of motion.  Neurological: He is alert.  Skin: Skin is warm and dry.  Psychiatric: He has a normal mood and affect.   Radiology: No results found.  Laboratory examination:    CMP Latest Ref Rng & Units 01/03/2012 01/06/2011 04/23/2009  Glucose 70 - 99  mg/dL 124(H) 90 130(H)  BUN 6 - 23 mg/dL 9 9 6   Creatinine 0.50 - 1.35 mg/dL 1.00 0.97 0.86  Sodium 135 - 145 mEq/L 143 139 136  Potassium 3.5 - 5.1 mEq/L 3.7 4.8 3.9  Chloride 96 - 112 mEq/L 105 104 103  CO2 19 - 32 mEq/L - 28 28  Calcium 8.4 - 10.5 mg/dL - 9.7 8.7   CBC Latest Ref Rng & Units 01/03/2012 01/06/2011 04/23/2009  WBC 4.0 - 10.5  K/uL - 6.8 8.4  Hemoglobin 13.0 - 17.0 g/dL 13.9 12.9(L) 11.7(L)  Hematocrit 39.0 - 52.0 % 41.0 39.0 34.0(L)  Platelets 150 - 400 K/uL - 164 131(L)   Lipid Panel  No results found for: CHOL, TRIG, HDL, CHOLHDL, VLDL, LDLCALC, LDLDIRECT HEMOGLOBIN A1C No results found for: HGBA1C, MPG TSH No results for input(s): TSH in the last 8760 hours.  Medications   Current Outpatient Medications  Medication Instructions  . amLODipine (NORVASC) 10 mg, Oral, Daily  . aspirin EC 81 mg, Daily  . bimatoprost (LUMIGAN) 0.01 % SOLN 1 drop, Both Eyes, Daily at bedtime  . carvedilol (COREG) 12.5 mg, Oral, 2 times daily  . hydrALAZINE (APRESOLINE) 25 mg, Oral, 3 times daily, Can take as needed for BP >140/80  . levothyroxine (LEVOTHROID) 50 mcg, Daily  . losartan (COZAAR) 25 mg, Oral, Daily  . omeprazole (PRILOSEC) 20 mg, Oral, Daily at bedtime  . rosuvastatin (CRESTOR) 5 mg, Daily  . silodosin (RAPAFLO) 8 mg, Daily with breakfast  . vitamin B-12 (CYANOCOBALAMIN) 500 mcg, Daily   Cardiac Studies:   MV repair with implantation of 28 mm Edwards annuloplasty ring at [facility], [place] 10/08/2008 and incidental CAD, S/P LIMA to LAD CABG.  Exercise myoview stress 12/29/2015: 1. The resting electrocardiogram demonstrated normal sinus rhythm, RBBB and no resting arrhythmias. Cannot exclude inferior infarct old. The stress electrocardiogram was normal. Patient exercised on Bruce protocol for 5.0 minutes and achieved 7.03 METS. Stress test terminated due to dyspnea and target heart rate( 99% MPHR). 2. Myocardial perfusion imaging is normal. Overall left  ventricular systolic function was normal without regional wall motion abnormalities. The left ventricular ejection fraction was 55%   Echocardiogram 01/06/2016: Left ventricle cavity is normal in size. Normal global wall motion. Doppler evidence of grade II (pseudonormal) diastolic dysfunction with elevated LA and LV EDP. However this could be an error due to MV repair and presence of MV ring. Calculated EF 70%. Left atrial cavity is mildly dilated at 4.1 cm. Minimal calcification of the AV. Mild to moderate aortic regurgitation. MV ring noted. No paravalvular leak. Trace mitral regurgitation. Trace mitral valve stenosis. Mitral valve peak pressure gradient of 6.3 and mean gradient of 2 mmHg, calculated mitral valve area 1.5 cm. Probably within normal limits with MV repair. Mild tricuspid regurgitation. No evidence of pulmonary hypertension. No significant change from 05/07/2013.  Carotid artery duplex 01/04/2017: Minimal stenosis in the bilateral internal carotid artery (1-15%) with homogenous plaque. Antegrade right vertebral artery flow. Antegrade left vertebral artery flow. Right carotid endarterectomy site is patent. Compared to   01/06/2016, Stenosis in the left internal carotid artery (16-49%). not present.   Assessment   Coronary artery disease involving native coronary artery of native heart without angina pectoris - MV repair 28 mm Edwards annuloplasty ring at [facility], [place] 10/08/2008 and incidental CAD, S/P LIMA to LAD CABG. - Plan:   H/O mitral valve repair - MV repair 28 mm Edwards annuloplasty ring at [facility], [place] 10/08/2008 and incidental CAD, S/P LIMA to LAD CABG. - Plan:   Essential hypertension - Plan:   CKD (chronic kidney disease) stage 3, GFR 30-59 ml/min (HCC) - Plan:    EKG 03/28/2018: Normal sinus rhythm at rate of 81 bpm, left axis deviation, left anterior fascicular block.  Cannot exclude inferior infarct old.  Right bundle branch  block.  Poor R-wave progression, cannot exclude anteroseptal infarct old.  One pair of ventricular couplets.  No significant change from EKG 08/31/2017, V- Couplets new.  Recommendations:   Patient is presently doing well, his blood pressure has improved since being on hydralazine, he has been using it 2 times a day.  Advised him to increase it to 3 times a day and see if the systolic blood pressure will be less than 140 mmHg, goal blood pressure would be 130/80 mmHg.  Other option would be to changing him from carvedilol to labetalol, in view of bradycardia unable to use higher doses of the carvedilol.  His lipids are being managed by his PCP, he also has stage III chronic kidney disease probably related to hypertension.  These are remained stable.  With regard to mitral valve disease, he is aware that he needs endocarditis prophylaxis.  He has obstructive sleep apnea, in 2018 was diagnosed with this but he does not want to use CPAP.  He has daytime somnolence especially in the morning and fatigue in the morning could be related to nocturnal hypoxemia, we could certainly consider nocturnal oximetry if he is willing to wear oxygen at night.  Patient wants to think about this.  Otherwise he stable from cardiac standpoint I will see him back in 6 months.  Adrian Prows, MD, [facility] 12/12/2018, 4:15 PM [facility] [place] Pager: [phone] Office: [phone] If no answer Cell [phone]

## 2018-12-25 DIAGNOSIS — Z299 Encounter for prophylactic measures, unspecified: Secondary | ICD-10-CM | POA: Diagnosis not present

## 2018-12-25 DIAGNOSIS — E7800 Pure hypercholesterolemia, unspecified: Secondary | ICD-10-CM | POA: Diagnosis not present

## 2018-12-25 DIAGNOSIS — E039 Hypothyroidism, unspecified: Secondary | ICD-10-CM | POA: Diagnosis not present

## 2018-12-25 DIAGNOSIS — Z6821 Body mass index (BMI) 21.0-21.9, adult: Secondary | ICD-10-CM | POA: Diagnosis not present

## 2018-12-25 DIAGNOSIS — Z125 Encounter for screening for malignant neoplasm of prostate: Secondary | ICD-10-CM | POA: Diagnosis not present

## 2018-12-25 DIAGNOSIS — Z0000 Encounter for general adult medical examination without abnormal findings: Secondary | ICD-10-CM | POA: Diagnosis not present

## 2018-12-25 DIAGNOSIS — I10 Essential (primary) hypertension: Secondary | ICD-10-CM | POA: Diagnosis not present

## 2018-12-25 DIAGNOSIS — Z1211 Encounter for screening for malignant neoplasm of colon: Secondary | ICD-10-CM | POA: Diagnosis not present

## 2018-12-25 DIAGNOSIS — Z7189 Other specified counseling: Secondary | ICD-10-CM | POA: Diagnosis not present

## 2018-12-25 DIAGNOSIS — Z1339 Encounter for screening examination for other mental health and behavioral disorders: Secondary | ICD-10-CM | POA: Diagnosis not present

## 2018-12-25 DIAGNOSIS — Z79899 Other long term (current) drug therapy: Secondary | ICD-10-CM | POA: Diagnosis not present

## 2018-12-25 DIAGNOSIS — R5383 Other fatigue: Secondary | ICD-10-CM | POA: Diagnosis not present

## 2018-12-25 DIAGNOSIS — Z1331 Encounter for screening for depression: Secondary | ICD-10-CM | POA: Diagnosis not present

## 2019-01-11 DIAGNOSIS — R3121 Asymptomatic microscopic hematuria: Secondary | ICD-10-CM | POA: Diagnosis not present

## 2019-01-11 DIAGNOSIS — R351 Nocturia: Secondary | ICD-10-CM | POA: Diagnosis not present

## 2019-01-11 DIAGNOSIS — N401 Enlarged prostate with lower urinary tract symptoms: Secondary | ICD-10-CM | POA: Diagnosis not present

## 2019-01-23 DIAGNOSIS — K5710 Diverticulosis of small intestine without perforation or abscess without bleeding: Secondary | ICD-10-CM | POA: Diagnosis not present

## 2019-01-23 DIAGNOSIS — R3121 Asymptomatic microscopic hematuria: Secondary | ICD-10-CM | POA: Diagnosis not present

## 2019-01-23 DIAGNOSIS — N261 Atrophy of kidney (terminal): Secondary | ICD-10-CM | POA: Diagnosis not present

## 2019-02-05 DIAGNOSIS — I6529 Occlusion and stenosis of unspecified carotid artery: Secondary | ICD-10-CM | POA: Diagnosis not present

## 2019-02-08 DIAGNOSIS — N270 Small kidney, unilateral: Secondary | ICD-10-CM | POA: Diagnosis not present

## 2019-02-08 DIAGNOSIS — R3121 Asymptomatic microscopic hematuria: Secondary | ICD-10-CM | POA: Diagnosis not present

## 2019-02-12 DIAGNOSIS — I701 Atherosclerosis of renal artery: Secondary | ICD-10-CM | POA: Diagnosis not present

## 2019-02-12 DIAGNOSIS — Z299 Encounter for prophylactic measures, unspecified: Secondary | ICD-10-CM | POA: Diagnosis not present

## 2019-02-12 DIAGNOSIS — I10 Essential (primary) hypertension: Secondary | ICD-10-CM | POA: Diagnosis not present

## 2019-02-12 DIAGNOSIS — Z6821 Body mass index (BMI) 21.0-21.9, adult: Secondary | ICD-10-CM | POA: Diagnosis not present

## 2019-02-12 DIAGNOSIS — R109 Unspecified abdominal pain: Secondary | ICD-10-CM | POA: Diagnosis not present

## 2019-02-12 DIAGNOSIS — I700 Atherosclerosis of aorta: Secondary | ICD-10-CM | POA: Diagnosis not present

## 2019-02-28 ENCOUNTER — Telehealth: Payer: Self-pay

## 2019-02-28 NOTE — Telephone Encounter (Signed)
Pt called stating that his pcp Dr. Woody Seller was wanting to know if pt should still have colonoscopy's. Considering his age, and from a heart standpoint if it is needed. Please advise

## 2019-03-01 NOTE — Telephone Encounter (Signed)
No, it is not recommended if he had normal colonoscopy at age 84 years. From heart standpoint no contraindication for the procedure

## 2019-03-02 NOTE — Telephone Encounter (Signed)
LMOM advising.//ah

## 2019-03-20 DIAGNOSIS — H0102A Squamous blepharitis right eye, upper and lower eyelids: Secondary | ICD-10-CM | POA: Diagnosis not present

## 2019-03-20 DIAGNOSIS — Z961 Presence of intraocular lens: Secondary | ICD-10-CM | POA: Diagnosis not present

## 2019-03-20 DIAGNOSIS — H43811 Vitreous degeneration, right eye: Secondary | ICD-10-CM | POA: Diagnosis not present

## 2019-03-20 DIAGNOSIS — H04123 Dry eye syndrome of bilateral lacrimal glands: Secondary | ICD-10-CM | POA: Diagnosis not present

## 2019-03-20 DIAGNOSIS — H401130 Primary open-angle glaucoma, bilateral, stage unspecified: Secondary | ICD-10-CM | POA: Diagnosis not present

## 2019-03-20 DIAGNOSIS — H26493 Other secondary cataract, bilateral: Secondary | ICD-10-CM | POA: Diagnosis not present

## 2019-03-20 DIAGNOSIS — Z9889 Other specified postprocedural states: Secondary | ICD-10-CM | POA: Diagnosis not present

## 2019-03-20 DIAGNOSIS — H0102B Squamous blepharitis left eye, upper and lower eyelids: Secondary | ICD-10-CM | POA: Diagnosis not present

## 2019-03-22 DIAGNOSIS — N3000 Acute cystitis without hematuria: Secondary | ICD-10-CM | POA: Diagnosis not present

## 2019-03-25 ENCOUNTER — Other Ambulatory Visit: Payer: Self-pay | Admitting: Cardiology

## 2019-03-25 DIAGNOSIS — I10 Essential (primary) hypertension: Secondary | ICD-10-CM

## 2019-03-26 ENCOUNTER — Other Ambulatory Visit: Payer: Self-pay

## 2019-03-26 DIAGNOSIS — I10 Essential (primary) hypertension: Secondary | ICD-10-CM

## 2019-03-26 MED ORDER — HYDRALAZINE HCL 25 MG PO TABS: ORAL_TABLET | ORAL | 0 refills | Status: DC

## 2019-03-28 DIAGNOSIS — M546 Pain in thoracic spine: Secondary | ICD-10-CM | POA: Diagnosis not present

## 2019-03-28 DIAGNOSIS — I2510 Atherosclerotic heart disease of native coronary artery without angina pectoris: Secondary | ICD-10-CM | POA: Diagnosis not present

## 2019-03-28 DIAGNOSIS — I10 Essential (primary) hypertension: Secondary | ICD-10-CM | POA: Diagnosis not present

## 2019-03-28 DIAGNOSIS — Z299 Encounter for prophylactic measures, unspecified: Secondary | ICD-10-CM | POA: Diagnosis not present

## 2019-03-28 DIAGNOSIS — Z6821 Body mass index (BMI) 21.0-21.9, adult: Secondary | ICD-10-CM | POA: Diagnosis not present

## 2019-03-28 DIAGNOSIS — I700 Atherosclerosis of aorta: Secondary | ICD-10-CM | POA: Diagnosis not present

## 2019-03-28 DIAGNOSIS — I6529 Occlusion and stenosis of unspecified carotid artery: Secondary | ICD-10-CM | POA: Diagnosis not present

## 2019-03-30 ENCOUNTER — Other Ambulatory Visit: Payer: Self-pay

## 2019-03-30 ENCOUNTER — Ambulatory Visit (INDEPENDENT_AMBULATORY_CARE_PROVIDER_SITE_OTHER): Payer: Medicare Other | Admitting: Cardiology

## 2019-03-30 ENCOUNTER — Encounter: Payer: Self-pay | Admitting: Cardiology

## 2019-03-30 VITALS — BP 135/53 | HR 60 | Temp 96.5°F | Ht 66.0 in | Wt 143.5 lb

## 2019-03-30 DIAGNOSIS — I10 Essential (primary) hypertension: Secondary | ICD-10-CM | POA: Diagnosis not present

## 2019-03-30 DIAGNOSIS — I2510 Atherosclerotic heart disease of native coronary artery without angina pectoris: Secondary | ICD-10-CM | POA: Diagnosis not present

## 2019-03-30 DIAGNOSIS — I453 Trifascicular block: Secondary | ICD-10-CM

## 2019-03-30 DIAGNOSIS — Z9889 Other specified postprocedural states: Secondary | ICD-10-CM

## 2019-03-30 DIAGNOSIS — R0989 Other specified symptoms and signs involving the circulatory and respiratory systems: Secondary | ICD-10-CM | POA: Diagnosis not present

## 2019-03-30 NOTE — Progress Notes (Signed)
Primary Physician/Referring:  Glenda Chroman, MD  Patient ID: [name], male    DOB: Oct 12, 1934, 84 y.o.   MRN: [id number]  Chief Complaint  Patient presents with   Coronary artery disease involving native coronary artery of    Hypertension   Follow-up    1 yr   HPI:    HPI: [name]  is a 84 y.o. with history of known coronary artery disease, history of 1 vessel CABG with LIMA to LAD and mitral valve repair done in [place], in May 2010, history of stroke due to symptomatic right carotid artery stenosis in September 2010, with right carotid endarterectomy in November 2010.  Patient was last seen in June 2020, blood pressure had improved with being on hydralazine.  At his last office visit hydralazine was increased to 3 times a day.  Carvedilol was stopped further increased in view of bradycardia.  Due to daytime somnolence, we have discussed nocturnal oximetry; however, patient wanted to think about this prior to undergoing.   Accompanied by his wife.  Past Medical History:  Diagnosis Date   CVA (cerebral infarction)    Significant carotid artery disease status post right carotid endarterectomy, postoperatively   Depression    GERD (gastroesophageal reflux disease)    Heart murmur    Hemorrhoids    Hernia    History of BPH    Hyperlipemia    Hypertension    Hypothyroidism    Mitral regurgitation     status post mitral valve repair at [facility] in [facility]   Status post coronary artery bypass grafting    Single-vessel coronary bypass grafting at time of mitral valve repair   Stroke [facility]) Oct 2010   Thyroid disease    hypothyroidism   Past Surgical History:  Procedure Laterality Date   CAROTID ENDARTERECTOMY Right Nov. 2010   CEA   CATARACT EXTRACTION     CHOLECYSTECTOMY     COLONOSCOPY N/A 05/30/2013   Procedure: COLONOSCOPY;  Surgeon: Rogene Houston, MD;  [facility];  Service: Endoscopy;   Laterality: N/A;  1230-rescheduled to 12:00 Ann notified pt   CORONARY ARTERY BYPASS GRAFT     ERCP     with biliary and pancreatic stenting   INGUINAL HERNIA REPAIR  01/19/2011   Bilateral   MITRAL VALVE REPAIR  April 2010   MITRAL VALVE REPAIR  2010   nevus removed     TURP VAPORIZATION     WART FULGURATION  01/03/2012   Procedure: FULGURATION ANAL WART;  Surgeon: Zenovia Jarred, MD;  Location: [facility];  Service: General;  Laterality: N/A;  excision perianal condylomata   Social History   Socioeconomic History   Marital status: Married    Spouse name: Not on file   Number of children: 2   Years of education: Not on file   Highest education level: Not on file  Occupational History   Not on file  Social Needs   Financial resource strain: Not on file   Food insecurity    Worry: Not on file    Inability: Not on file   Transportation needs    Medical: Not on file    Non-medical: Not on file  Tobacco Use   Smoking status: Never Smoker   Smokeless tobacco: Never Used  Substance and Sexual Activity   Alcohol use: No   Drug use: No   Sexual activity: Not on file  Lifestyle  Physical activity    Days per week: Not on file    Minutes per session: Not on file   Stress: Not on file  Relationships   Social connections    Talks on phone: Not on file    Gets together: Not on file    Attends religious service: Not on file    Active member of club or organization: Not on file    Attends meetings of clubs or organizations: Not on file    Relationship status: Not on file   Intimate partner violence    Fear of current or ex partner: Not on file    Emotionally abused: Not on file    Physically abused: Not on file    Forced sexual activity: Not on file  Other Topics Concern   Not on file  Social History Narrative   Not on file   ROS  Review of Systems  Constitution: Positive for malaise/fatigue. Negative for decreased appetite,  weight gain and weight loss.  Eyes: Negative for visual disturbance.  Cardiovascular: Negative for chest pain, claudication, dyspnea on exertion, leg swelling, orthopnea, palpitations and syncope.  Respiratory: Positive for snoring (has sleep apnea, does not use CPAP). Negative for hemoptysis and wheezing.   Endocrine: Negative for cold intolerance and heat intolerance.  Hematologic/Lymphatic: Does not bruise/bleed easily.  Skin: Negative for nail changes.  Musculoskeletal: Negative for muscle weakness and myalgias.  Gastrointestinal: Negative for abdominal pain, change in bowel habit, nausea and vomiting.  Genitourinary: Positive for frequency (follows Dr. Irine Seal).  Neurological: Negative for difficulty with concentration, dizziness, focal weakness and headaches.  Psychiatric/Behavioral: Positive for depression. Negative for altered mental status and suicidal ideas. The patient is nervous/anxious.   All other systems reviewed and are negative.  Objective  Blood pressure (!) 135/53, pulse 60, temperature (!) 96.5 F (35.8 C), height 5\' 6"  (1.676 m), weight 143 lb 8 oz (65.1 kg), SpO2 98 %. Body mass index is 23.16 kg/m.   Physical Exam  Constitutional: He is oriented to person, place, and time. Vital signs are normal. He appears well-developed and well-nourished.  HENT:  Head: Normocephalic and atraumatic.  Neck: Normal range of motion.  Cardiovascular: Normal rate, regular rhythm, normal heart sounds, intact distal pulses and normal pulses.  Pulses:      Carotid pulses are on the right side with bruit and on the left side with bruit. Right carotid endarterectomy scar noted. No edema.   Pulmonary/Chest: Effort normal and breath sounds normal. No accessory muscle usage. No respiratory distress.  Abdominal: Soft. Bowel sounds are normal. He exhibits abdominal bruit. He exhibits no pulsatile midline mass.  Musculoskeletal: Normal range of motion.  Neurological: He is alert and  oriented to person, place, and time.  Skin: Skin is warm and dry.  Vitals reviewed.  Radiology: No results found.  Laboratory examination:   No results for input(s): NA, K, CL, CO2, GLUCOSE, BUN, CREATININE, CALCIUM, GFRNONAA, GFRAA in the last 8760 hours. CMP Latest Ref Rng & Units 01/03/2012 01/06/2011 04/23/2009  Glucose 70 - 99 mg/dL 124(H) 90 130(H)  BUN 6 - 23 mg/dL 9 9 6   Creatinine 0.50 - 1.35 mg/dL 1.00 0.97 0.86  Sodium 135 - 145 mEq/L 143 139 136  Potassium 3.5 - 5.1 mEq/L 3.7 4.8 3.9  Chloride 96 - 112 mEq/L 105 104 103  CO2 19 - 32 mEq/L - 28 28  Calcium 8.4 - 10.5 mg/dL - 9.7 8.7   CBC Latest Ref Rng & Units 01/03/2012 01/06/2011  04/23/2009  WBC 4.0 - 10.5 K/uL - 6.8 8.4  Hemoglobin 13.0 - 17.0 g/dL 13.9 12.9(L) 11.7(L)  Hematocrit 39.0 - 52.0 % 41.0 39.0 34.0(L)  Platelets 150 - 400 K/uL - 164 131(L)   Lipid Panel  No results found for: CHOL, TRIG, HDL, CHOLHDL, VLDL, LDLCALC, LDLDIRECT HEMOGLOBIN A1C No results found for: HGBA1C, MPG TSH No results for input(s): TSH in the last 8760 hours. Medications   Current Outpatient Medications  Medication Instructions   amLODipine (NORVASC) 10 mg, Oral, Daily   aspirin EC 81 mg, Daily   bimatoprost (LUMIGAN) 0.01 % SOLN 1 drop, Both Eyes, Daily at bedtime   carvedilol (COREG) 12.5 mg, Oral, 2 times daily   hydrALAZINE (APRESOLINE) 25 MG tablet TAKE 1 TABLET BY MOUTH THREE TIMES DAILY CAN  TAKE  AS  NEEDED  FOR  BLOOD  PRESSURE  GREATER  THAN  140/80   levothyroxine (LEVOTHROID) 50 mcg, Oral, Every other day   levothyroxine (SYNTHROID) 75 mcg, Oral, Every other day, Alternates every other day with 50mg     losartan-hydrochlorothiazide (HYZAAR) 100-12.5 MG tablet 1 tablet, Oral, Daily   omeprazole (PRILOSEC) 20 mg, Oral, Daily at bedtime   rosuvastatin (CRESTOR) 2.5 mg, Oral, Daily   silodosin (RAPAFLO) 8 mg, Daily with breakfast   vitamin B-12 (CYANOCOBALAMIN) 500 mcg, Daily    Cardiac Studies:    Exercise myoview stress 12/29/2015: 1. The resting electrocardiogram demonstrated normal sinus rhythm, RBBB and no resting arrhythmias. Cannot exclude inferior infarct old. The stress electrocardiogram was normal. Patient exercised on Bruce protocol for 5.0 minutes and achieved 7.03 METS. Stress test terminated due to dyspnea and target heart rate( 99% MPHR). 2. Myocardial perfusion imaging is normal. Overall left ventricular systolic function was normal without regional wall motion abnormalities. The left ventricular ejection fraction was 55%  Echocardiogram 01/06/2016: Left ventricle cavity is normal in size. Normal global wall motion. Doppler evidence of grade II (pseudonormal) diastolic dysfunction with elevated LA and LV EDP. However this could be an error due to MV repair and presence of MV ring. Calculated EF 70%. Left atrial cavity is mildly dilated at 4.1 cm. Minimal calcification of the AV. Mild to moderate aortic regurgitation. MV ring noted. No paravalvular leak. Trace mitral regurgitation. Trace mitral valve stenosis. Mitral valve peak pressure gradient of 6.3 and mean gradient of 2 mmHg, calculated mitral valve area 1.5 cm. Probably within normal limits with MV repair. Mild tricuspid regurgitation. No evidence of pulmonary hypertension. No significant change from 05/07/2013.  Carotid artery duplex 01/04/2017: Minimal stenosis in the bilateral internal carotid artery (1-15%) with homogenous plaque. Antegrade right vertebral artery flow. Antegrade left vertebral artery flow. Right carotid endarterectomy site is patent. Compared to 01/06/2016, Stenosis in the left internal carotid artery (16-49%). not present.  Assessment     ICD-10-CM   1. Coronary artery disease involving native coronary artery of native heart without angina pectoris  I25.10 EKG 12-Lead  2. History of mitral valve repair  MV repair with implantation of 28 mm Edwards annuloplasty ring at [facility] [employer] 10/08/2008  Z98.890   3. Primary hypertension  I10   4. Trifascicular block  I45.3   5. Bilateral carotid bruits  R09.89   6. History of right-sided carotid endarterectomy  Z98.890     EKG 03/30/2019: Sinus rhythm with first-degree AV block at rate of 58 bpm, left atrial enlargement, left axis deviation, left anterior fascicular block.  Right bundle branch block.  Bifascicular block.  T wave  abnormality, cannot exclude anterolateral ischemia.Compared to 03/28/2018, First-degree AV block is new.  Recommendations:   Patient is here on a six-month office visit of coronary artery disease and hypertension.  He now has trifascicular block, first-degree AV block is new compared to previous.  His blood pressure is now well controlled and he remains asymptomatic.  He is aware that he needs endocarditis prophylaxis in view of mitral valve repair in the past.  He also has slightly more prominent bilateral carotid bruit, in 2018 he had mild left ICA stenosis and right carotid endarterectomy site was widely patent.  Patient states that he has had ultrasound of his carotid arteries just a month ago from Dr. Woody Seller and told to be stable.  He will follow-up with him regarding this.  He also states that his labs have been stable including lipids and renal function.  I did not make any changes to his medications.  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  I'll see him back in 6 months or sooner if problems.  Henry Prows, MD, [facility] 03/30/2019, 3:45 PM [facility]. [place] Pager: [phone] Office: [phone] If no answer Cell [phone]

## 2019-04-04 ENCOUNTER — Ambulatory Visit: Payer: No Typology Code available for payment source | Admitting: Cardiology

## 2019-04-04 ENCOUNTER — Other Ambulatory Visit: Payer: Self-pay

## 2019-04-04 MED ORDER — LOSARTAN POTASSIUM-HCTZ 50-12.5 MG PO TABS: 1.0000 | ORAL_TABLET | Freq: Every day | ORAL | 6 refills | Status: DC

## 2019-04-19 DIAGNOSIS — M542 Cervicalgia: Secondary | ICD-10-CM | POA: Diagnosis not present

## 2019-04-19 DIAGNOSIS — Z299 Encounter for prophylactic measures, unspecified: Secondary | ICD-10-CM | POA: Diagnosis not present

## 2019-04-19 DIAGNOSIS — I10 Essential (primary) hypertension: Secondary | ICD-10-CM | POA: Diagnosis not present

## 2019-04-19 DIAGNOSIS — M47814 Spondylosis without myelopathy or radiculopathy, thoracic region: Secondary | ICD-10-CM | POA: Diagnosis not present

## 2019-04-19 DIAGNOSIS — Z6821 Body mass index (BMI) 21.0-21.9, adult: Secondary | ICD-10-CM | POA: Diagnosis not present

## 2019-04-19 DIAGNOSIS — E7800 Pure hypercholesterolemia, unspecified: Secondary | ICD-10-CM | POA: Diagnosis not present

## 2019-04-19 DIAGNOSIS — M47812 Spondylosis without myelopathy or radiculopathy, cervical region: Secondary | ICD-10-CM | POA: Diagnosis not present

## 2019-04-19 DIAGNOSIS — M546 Pain in thoracic spine: Secondary | ICD-10-CM | POA: Diagnosis not present

## 2019-04-19 DIAGNOSIS — E039 Hypothyroidism, unspecified: Secondary | ICD-10-CM | POA: Diagnosis not present

## 2019-05-26 ENCOUNTER — Other Ambulatory Visit: Payer: Self-pay | Admitting: Cardiology

## 2019-05-26 DIAGNOSIS — I10 Essential (primary) hypertension: Secondary | ICD-10-CM

## 2019-06-26 ENCOUNTER — Other Ambulatory Visit: Payer: Self-pay | Admitting: Cardiology

## 2019-06-26 DIAGNOSIS — I10 Essential (primary) hypertension: Secondary | ICD-10-CM

## 2019-07-06 DIAGNOSIS — I701 Atherosclerosis of renal artery: Secondary | ICD-10-CM | POA: Diagnosis not present

## 2019-07-06 DIAGNOSIS — Z299 Encounter for prophylactic measures, unspecified: Secondary | ICD-10-CM | POA: Diagnosis not present

## 2019-07-06 DIAGNOSIS — I2510 Atherosclerotic heart disease of native coronary artery without angina pectoris: Secondary | ICD-10-CM | POA: Diagnosis not present

## 2019-07-06 DIAGNOSIS — I10 Essential (primary) hypertension: Secondary | ICD-10-CM | POA: Diagnosis not present

## 2019-07-06 DIAGNOSIS — Z6821 Body mass index (BMI) 21.0-21.9, adult: Secondary | ICD-10-CM | POA: Diagnosis not present

## 2019-07-06 DIAGNOSIS — E039 Hypothyroidism, unspecified: Secondary | ICD-10-CM | POA: Diagnosis not present

## 2019-07-06 DIAGNOSIS — E7800 Pure hypercholesterolemia, unspecified: Secondary | ICD-10-CM | POA: Diagnosis not present

## 2019-08-07 ENCOUNTER — Other Ambulatory Visit: Payer: Self-pay | Admitting: Cardiology

## 2019-08-07 DIAGNOSIS — I10 Essential (primary) hypertension: Secondary | ICD-10-CM

## 2019-08-21 DIAGNOSIS — H0102B Squamous blepharitis left eye, upper and lower eyelids: Secondary | ICD-10-CM | POA: Diagnosis not present

## 2019-08-21 DIAGNOSIS — H04123 Dry eye syndrome of bilateral lacrimal glands: Secondary | ICD-10-CM | POA: Diagnosis not present

## 2019-08-21 DIAGNOSIS — H0102A Squamous blepharitis right eye, upper and lower eyelids: Secondary | ICD-10-CM | POA: Diagnosis not present

## 2019-08-21 DIAGNOSIS — Z961 Presence of intraocular lens: Secondary | ICD-10-CM | POA: Diagnosis not present

## 2019-08-22 DIAGNOSIS — N401 Enlarged prostate with lower urinary tract symptoms: Secondary | ICD-10-CM | POA: Diagnosis not present

## 2019-08-22 DIAGNOSIS — R351 Nocturia: Secondary | ICD-10-CM | POA: Diagnosis not present

## 2019-08-22 DIAGNOSIS — Z87440 Personal history of urinary (tract) infections: Secondary | ICD-10-CM | POA: Diagnosis not present

## 2019-09-01 ENCOUNTER — Other Ambulatory Visit: Payer: Self-pay | Admitting: Cardiology

## 2019-09-01 DIAGNOSIS — I10 Essential (primary) hypertension: Secondary | ICD-10-CM

## 2019-09-28 ENCOUNTER — Ambulatory Visit: Payer: Medicare Other | Admitting: Cardiology

## 2019-10-01 ENCOUNTER — Ambulatory Visit: Payer: Medicare Other | Admitting: Cardiology

## 2019-10-01 ENCOUNTER — Encounter: Payer: Self-pay | Admitting: Cardiology

## 2019-10-01 ENCOUNTER — Other Ambulatory Visit: Payer: Self-pay

## 2019-10-01 VITALS — BP 141/57 | HR 57 | Temp 97.6°F | Resp 16 | Ht 66.0 in | Wt 141.0 lb

## 2019-10-01 DIAGNOSIS — I340 Nonrheumatic mitral (valve) insufficiency: Secondary | ICD-10-CM

## 2019-10-01 DIAGNOSIS — Z9889 Other specified postprocedural states: Secondary | ICD-10-CM | POA: Diagnosis not present

## 2019-10-01 DIAGNOSIS — I10 Essential (primary) hypertension: Secondary | ICD-10-CM

## 2019-10-01 DIAGNOSIS — I453 Trifascicular block: Secondary | ICD-10-CM | POA: Diagnosis not present

## 2019-10-01 DIAGNOSIS — I2510 Atherosclerotic heart disease of native coronary artery without angina pectoris: Secondary | ICD-10-CM | POA: Diagnosis not present

## 2019-10-01 DIAGNOSIS — E7800 Pure hypercholesterolemia, unspecified: Secondary | ICD-10-CM

## 2019-10-01 MED ORDER — HYDRALAZINE HCL 25 MG PO TABS: 25.0000 mg | ORAL_TABLET | Freq: Three times a day (TID) | ORAL | 3 refills | Status: DC

## 2019-10-01 NOTE — Progress Notes (Signed)
Primary Physician/Referring:  Glenda Chroman, MD  Patient ID: [name], male    DOB: 24-Nov-1934, 85 y.o.   MRN: [id number]  Chief Complaint  Patient presents with  . Coronary Artery Disease  . Hypertension  . Follow-up    6 month   HPI:    HPI: [name]  is a 85 y.o. with history of known coronary artery disease, history of 1 vessel CABG with LIMA to LAD and mitral valve repair done in [place], in May 2010, history of stroke due to symptomatic right carotid artery stenosis in September 2010, with right carotid endarterectomy in November 2010, hypertension, hyperlipidemia, trifascicular block.  He is presently doing well without any chest pain or dyspnea except his hydralazine which was 25 mg p.o. 3 times daily in the interim has been reduced to twice daily as needed.  He has noticed elevated blood pressure since.  He has also noticed occasional ankle edema.  Past Medical History:  Diagnosis Date  . CVA (cerebral infarction)    Significant carotid artery disease status post right carotid endarterectomy, postoperatively  . Depression   . GERD (gastroesophageal reflux disease)   . Heart murmur   . Hemorrhoids   . Hernia   . History of BPH   . Hyperlipemia   . Hypertension   . Hypothyroidism   . Mitral regurgitation     status post mitral valve repair at [facility] in [place]  . Status post coronary artery bypass grafting    Single-vessel coronary bypass grafting at time of mitral valve repair  . Stroke [facility]) Oct 2010  . Thyroid disease    hypothyroidism   Past Surgical History:  Procedure Laterality Date  . CAROTID ENDARTERECTOMY Right Nov. 2010   CEA  . CATARACT EXTRACTION    . CHOLECYSTECTOMY    . COLONOSCOPY N/A 05/30/2013   Procedure: COLONOSCOPY;  Surgeon: Rogene Houston, MD;  [facility];  Service: Endoscopy;  Laterality: N/A;  1230-rescheduled to 12:00 Ann notified pt  . CORONARY ARTERY BYPASS GRAFT    . ERCP      with biliary and pancreatic stenting  . INGUINAL HERNIA REPAIR  01/19/2011   Bilateral  . MITRAL VALVE REPAIR  April 2010  . MITRAL VALVE REPAIR  2010  . nevus removed    . TURP VAPORIZATION    . WART FULGURATION  01/03/2012   Procedure: FULGURATION ANAL WART;  Surgeon: Zenovia Jarred, MD;  Location: [facility];  Service: General;  Laterality: N/A;  excision perianal condylomata   Social History   Tobacco Use  . Smoking status: Never Smoker  . Smokeless tobacco: Never Used  Substance Use Topics  . Alcohol use: No   Marital Status: Married   ROS  Review of Systems  Cardiovascular: Positive for leg swelling. Negative for chest pain and dyspnea on exertion.  Gastrointestinal: Negative for bloating and heartburn.   Objective  Blood pressure (!) 141/57, pulse (!) 57, temperature 97.6 F (36.4 C), temperature source Temporal, resp. rate 16, height 5\' 6"  (1.676 m), weight 141 lb (64 kg), SpO2 97 %. Body mass index is 22.76 kg/m.   Physical Exam  Constitutional: He is oriented to person, place, and time. Vital signs are normal. He appears well-developed and well-nourished.  Cardiovascular: Normal rate, regular rhythm, intact distal pulses and normal pulses.  Murmur heard.  Blowing midsystolic murmur is present at the upper right sternal border radiating to the apex.  Pulses:      Carotid pulses are on the right side with bruit and on the left side with bruit. Right carotid endarterectomy scar noted. Trace ankle edema.  No JVD,  Pulmonary/Chest: Effort normal and breath sounds normal. No accessory muscle usage. No respiratory distress.  Abdominal: Soft. Bowel sounds are normal. He exhibits abdominal bruit. He exhibits no pulsatile midline mass.  Musculoskeletal:        General: Normal range of motion.  Neurological: He is alert and oriented to person, place, and time.  Vitals reviewed.  Radiology: No results found.  Laboratory examination:   External  labs:  Cholesterol, total 141.000 M 12/25/2018 HDL 39.000 M 12/25/2018 LDL 83 Triglycerides 94.000 M 12/25/2018  A1C N/D TSH 3.350 12/25/2018  Hemoglobin 12.500 G/ 12/25/2018 Platelets 207.000 X 12/25/2018  Creatinine, Serum 1.580 MG/ 12/25/2018 Potassium 5.100 MM 01/26/2016 ALT (SGPT) 9.000 IU/L 12/25/2018  Medications   Current Outpatient Medications  Medication Instructions  . amLODipine (NORVASC) 10 mg, Oral, Daily  . aspirin EC 81 mg, Daily  . bimatoprost (LUMIGAN) 0.01 % SOLN 1 drop, Both Eyes, Daily at bedtime  . carvedilol (COREG) 12.5 MG tablet Take 1 tablet by mouth twice daily  . hydrALAZINE (APRESOLINE) 25 mg, Oral, 3 times daily  . levothyroxine (LEVOTHROID) 50 mcg, Oral, Every other day  . levothyroxine (SYNTHROID) 75 mcg, Oral, Every other day, Alternates every other day with 50mg    . losartan-hydrochlorothiazide (HYZAAR) 50-12.5 MG tablet 1 tablet, Oral, Daily  . omeprazole (PRILOSEC) 20 mg, Oral, Daily at bedtime  . rosuvastatin (CRESTOR) 2.5 mg, Oral, Daily  . silodosin (RAPAFLO) 8 mg, Daily with breakfast  . vitamin B-12 (CYANOCOBALAMIN) 500 mcg, Daily   Cardiac Studies:   Exercise myoview stress 12/29/2015: 1. The resting electrocardiogram demonstrated normal sinus rhythm, RBBB and no resting arrhythmias. Cannot exclude inferior infarct old. The stress electrocardiogram was normal. Patient exercised on Bruce protocol for 5.0 minutes and achieved 7.03 METS. Stress test terminated due to dyspnea and target heart rate( 99% MPHR). 2. Myocardial perfusion imaging is normal. Overall left ventricular systolic function was normal without regional wall motion abnormalities. The left ventricular ejection fraction was 55%  Echocardiogram 01/06/2016: Left ventricle cavity is normal in size. Normal global wall motion. Doppler evidence of grade II (pseudonormal) diastolic dysfunction with elevated LA and LV EDP. However this could be an error due to MV repair and presence of MV  ring. Calculated EF 70%. Left atrial cavity is mildly dilated at 4.1 cm. Minimal calcification of the AV. Mild to moderate aortic regurgitation. MV ring noted. No paravalvular leak. Trace mitral regurgitation. Trace mitral valve stenosis. Mitral valve peak pressure gradient of 6.3 and mean gradient of 2 mmHg, calculated mitral valve area 1.5 cm. Probably within normal limits with MV repair. Mild tricuspid regurgitation. No evidence of pulmonary hypertension. No significant change from 05/07/2013.  Carotid artery duplex 01/04/2017: Minimal stenosis in the bilateral internal carotid artery (1-15%) with homogenous plaque. Antegrade right vertebral artery flow. Antegrade left vertebral artery flow. Right carotid endarterectomy site is patent. Compared to 01/06/2016, Stenosis in the left internal carotid artery (16-49%). not present.  EKG:  EKG 10/01/2019: Sinus rhythm with first-degree block rate of 60 bpm, left axis deviation, left anterior fascicular block.  Right bundle branch block.  Trifascicular block. Probably progression, cannot exclude anteroseptal infarct old.  Nonspecific T abnormality.  No significant change from 03/30/2019.  Assessment     ICD-10-CM   1. Coronary artery disease involving native coronary artery of native  heart without angina pectoris  I25.10 EKG 12-Lead    PCV ECHOCARDIOGRAM COMPLETE  2. History of mitral valve repair  MV repair with implantation of 28 mm Edwards annuloplasty ring at [facility] [place] 10/08/2008  Z98.890 PCV ECHOCARDIOGRAM COMPLETE  3. Primary hypertension  I10   4. Trifascicular block  I45.3   5. Essential hypertension  I10 hydrALAZINE (APRESOLINE) 25 MG tablet  6. Nonrheumatic mitral valve regurgitation  I34.0   7. Hypercholesteremia  E78.00     Recommendations:   [name]  is a 85 y.o. with history of known coronary artery disease, history of 1 vessel CABG with LIMA to LAD and mitral valve repair done in [place], in May  2010, history of stroke due to symptomatic right carotid artery stenosis in September 2010, with right carotid endarterectomy in November 2010, hypertension, hyperlipidemia, trifascicular block.  His blood pressure was previously well controlled, he was on hydralazine 25 mg p.o. 3 times daily which is being changed to twice daily as needed.  I change it back to 3 times daily, he will continue to monitor his blood pressure closely at home.  Lipids being managed by his PCP, prior LDL still >70 and presently on very low dose Crestor. He had c/o fatigue previously and presently doing the best in a while and hence prior to making change, probably watch for some time or add Zetia 10 mg.  From cardiac standpoint he is without recurrence of angina pectoris however I do hear a mitral regurgitation murmur that is new to him.  Will obtain an echocardiogram.  With regard to trifascicular block, he remains asymptomatic without dizziness or syncope, presently tolerating Coreg 12.5 mg p.o. twice daily.  We will continue the same, I will see him back in 6 months for follow-up.  Adrian Prows, MD, [facility] 10/01/2019, 10:16 PM [facility]. [place] Office: [phone]

## 2019-10-02 ENCOUNTER — Ambulatory Visit: Payer: Medicare Other | Admitting: Cardiology

## 2019-10-12 DIAGNOSIS — H0102A Squamous blepharitis right eye, upper and lower eyelids: Secondary | ICD-10-CM | POA: Diagnosis not present

## 2019-10-12 DIAGNOSIS — H0102B Squamous blepharitis left eye, upper and lower eyelids: Secondary | ICD-10-CM | POA: Diagnosis not present

## 2019-10-12 DIAGNOSIS — H401130 Primary open-angle glaucoma, bilateral, stage unspecified: Secondary | ICD-10-CM | POA: Diagnosis not present

## 2019-10-12 DIAGNOSIS — Z9889 Other specified postprocedural states: Secondary | ICD-10-CM | POA: Diagnosis not present

## 2019-10-12 DIAGNOSIS — H04123 Dry eye syndrome of bilateral lacrimal glands: Secondary | ICD-10-CM | POA: Diagnosis not present

## 2019-10-17 DIAGNOSIS — I701 Atherosclerosis of renal artery: Secondary | ICD-10-CM | POA: Diagnosis not present

## 2019-10-17 DIAGNOSIS — R600 Localized edema: Secondary | ICD-10-CM | POA: Diagnosis not present

## 2019-10-17 DIAGNOSIS — Z299 Encounter for prophylactic measures, unspecified: Secondary | ICD-10-CM | POA: Diagnosis not present

## 2019-10-17 DIAGNOSIS — I700 Atherosclerosis of aorta: Secondary | ICD-10-CM | POA: Diagnosis not present

## 2019-10-17 DIAGNOSIS — I10 Essential (primary) hypertension: Secondary | ICD-10-CM | POA: Diagnosis not present

## 2019-10-29 ENCOUNTER — Other Ambulatory Visit: Payer: Self-pay

## 2019-10-29 ENCOUNTER — Ambulatory Visit: Payer: Medicare Other

## 2019-10-29 DIAGNOSIS — I2510 Atherosclerotic heart disease of native coronary artery without angina pectoris: Secondary | ICD-10-CM

## 2019-10-29 DIAGNOSIS — Z9889 Other specified postprocedural states: Secondary | ICD-10-CM

## 2019-10-31 ENCOUNTER — Telehealth: Payer: Self-pay

## 2019-10-31 NOTE — Telephone Encounter (Signed)
Left message with son to have patient to call back for results.

## 2019-10-31 NOTE — Telephone Encounter (Signed)
-----   Message from Adrian Prows, MD sent at 10/31/2019 10:23 AM EDT ----- NOrmal LV systolic function. NO significant change from previous. Stable.  JG

## 2019-10-31 NOTE — Progress Notes (Signed)
Relayed information to patient. Patient voiced understanding.  

## 2019-10-31 NOTE — Telephone Encounter (Signed)
Gave patient results. He verbalized understanding. Patient asked for last office note to be mailed to him.

## 2019-11-13 ENCOUNTER — Other Ambulatory Visit: Payer: Self-pay | Admitting: Cardiology

## 2019-11-13 DIAGNOSIS — I10 Essential (primary) hypertension: Secondary | ICD-10-CM

## 2019-11-20 DIAGNOSIS — H04123 Dry eye syndrome of bilateral lacrimal glands: Secondary | ICD-10-CM | POA: Diagnosis not present

## 2019-11-20 DIAGNOSIS — Z9889 Other specified postprocedural states: Secondary | ICD-10-CM | POA: Diagnosis not present

## 2019-11-20 DIAGNOSIS — H26493 Other secondary cataract, bilateral: Secondary | ICD-10-CM | POA: Diagnosis not present

## 2019-11-20 DIAGNOSIS — Z961 Presence of intraocular lens: Secondary | ICD-10-CM | POA: Diagnosis not present

## 2019-11-20 DIAGNOSIS — H401130 Primary open-angle glaucoma, bilateral, stage unspecified: Secondary | ICD-10-CM | POA: Diagnosis not present

## 2019-11-20 DIAGNOSIS — H43811 Vitreous degeneration, right eye: Secondary | ICD-10-CM | POA: Diagnosis not present

## 2019-11-20 DIAGNOSIS — H0102B Squamous blepharitis left eye, upper and lower eyelids: Secondary | ICD-10-CM | POA: Diagnosis not present

## 2019-11-20 DIAGNOSIS — H0102A Squamous blepharitis right eye, upper and lower eyelids: Secondary | ICD-10-CM | POA: Diagnosis not present

## 2020-01-07 DIAGNOSIS — Z79899 Other long term (current) drug therapy: Secondary | ICD-10-CM | POA: Diagnosis not present

## 2020-01-07 DIAGNOSIS — Z1339 Encounter for screening examination for other mental health and behavioral disorders: Secondary | ICD-10-CM | POA: Diagnosis not present

## 2020-01-07 DIAGNOSIS — Z0000 Encounter for general adult medical examination without abnormal findings: Secondary | ICD-10-CM | POA: Diagnosis not present

## 2020-01-07 DIAGNOSIS — I10 Essential (primary) hypertension: Secondary | ICD-10-CM | POA: Diagnosis not present

## 2020-01-07 DIAGNOSIS — E039 Hypothyroidism, unspecified: Secondary | ICD-10-CM | POA: Diagnosis not present

## 2020-01-07 DIAGNOSIS — Z6821 Body mass index (BMI) 21.0-21.9, adult: Secondary | ICD-10-CM | POA: Diagnosis not present

## 2020-01-07 DIAGNOSIS — Z299 Encounter for prophylactic measures, unspecified: Secondary | ICD-10-CM | POA: Diagnosis not present

## 2020-01-07 DIAGNOSIS — Z1331 Encounter for screening for depression: Secondary | ICD-10-CM | POA: Diagnosis not present

## 2020-01-07 DIAGNOSIS — E7800 Pure hypercholesterolemia, unspecified: Secondary | ICD-10-CM | POA: Diagnosis not present

## 2020-01-07 DIAGNOSIS — Z7189 Other specified counseling: Secondary | ICD-10-CM | POA: Diagnosis not present

## 2020-01-07 DIAGNOSIS — R5383 Other fatigue: Secondary | ICD-10-CM | POA: Diagnosis not present

## 2020-02-06 ENCOUNTER — Other Ambulatory Visit: Payer: Self-pay | Admitting: Cardiology

## 2020-02-06 DIAGNOSIS — I10 Essential (primary) hypertension: Secondary | ICD-10-CM

## 2020-02-10 ENCOUNTER — Encounter (HOSPITAL_COMMUNITY): Payer: Self-pay | Admitting: Emergency Medicine

## 2020-02-10 ENCOUNTER — Inpatient Hospital Stay (HOSPITAL_COMMUNITY)
Admission: EM | Admit: 2020-02-10 | Discharge: 2020-02-14 | DRG: 194 | Disposition: A | Discharge status: Home Health | Payer: Medicare Other | Attending: Family Medicine | Admitting: Family Medicine

## 2020-02-10 ENCOUNTER — Other Ambulatory Visit: Payer: Self-pay

## 2020-02-10 ENCOUNTER — Emergency Department (HOSPITAL_COMMUNITY): Payer: Medicare Other

## 2020-02-10 DIAGNOSIS — I951 Orthostatic hypotension: Secondary | ICD-10-CM | POA: Diagnosis present

## 2020-02-10 DIAGNOSIS — N400 Enlarged prostate without lower urinary tract symptoms: Secondary | ICD-10-CM | POA: Diagnosis present

## 2020-02-10 DIAGNOSIS — Z20822 Contact with and (suspected) exposure to covid-19: Secondary | ICD-10-CM | POA: Diagnosis present

## 2020-02-10 DIAGNOSIS — Z951 Presence of aortocoronary bypass graft: Secondary | ICD-10-CM

## 2020-02-10 DIAGNOSIS — G4733 Obstructive sleep apnea (adult) (pediatric): Secondary | ICD-10-CM | POA: Diagnosis present

## 2020-02-10 DIAGNOSIS — E785 Hyperlipidemia, unspecified: Secondary | ICD-10-CM | POA: Diagnosis present

## 2020-02-10 DIAGNOSIS — I2510 Atherosclerotic heart disease of native coronary artery without angina pectoris: Secondary | ICD-10-CM | POA: Diagnosis present

## 2020-02-10 DIAGNOSIS — E871 Hypo-osmolality and hyponatremia: Secondary | ICD-10-CM | POA: Diagnosis present

## 2020-02-10 DIAGNOSIS — I129 Hypertensive chronic kidney disease with stage 1 through stage 4 chronic kidney disease, or unspecified chronic kidney disease: Secondary | ICD-10-CM | POA: Diagnosis present

## 2020-02-10 DIAGNOSIS — E860 Dehydration: Secondary | ICD-10-CM | POA: Diagnosis present

## 2020-02-10 DIAGNOSIS — G4730 Sleep apnea, unspecified: Secondary | ICD-10-CM | POA: Diagnosis present

## 2020-02-10 DIAGNOSIS — Z8249 Family history of ischemic heart disease and other diseases of the circulatory system: Secondary | ICD-10-CM

## 2020-02-10 DIAGNOSIS — Z79899 Other long term (current) drug therapy: Secondary | ICD-10-CM

## 2020-02-10 DIAGNOSIS — J189 Pneumonia, unspecified organism: Secondary | ICD-10-CM | POA: Diagnosis present

## 2020-02-10 DIAGNOSIS — K219 Gastro-esophageal reflux disease without esophagitis: Secondary | ICD-10-CM | POA: Diagnosis present

## 2020-02-10 DIAGNOSIS — N179 Acute kidney failure, unspecified: Secondary | ICD-10-CM | POA: Diagnosis present

## 2020-02-10 DIAGNOSIS — E039 Hypothyroidism, unspecified: Secondary | ICD-10-CM | POA: Diagnosis present

## 2020-02-10 DIAGNOSIS — E876 Hypokalemia: Secondary | ICD-10-CM | POA: Diagnosis not present

## 2020-02-10 DIAGNOSIS — R509 Fever, unspecified: Secondary | ICD-10-CM | POA: Diagnosis not present

## 2020-02-10 DIAGNOSIS — N1831 Chronic kidney disease, stage 3a: Secondary | ICD-10-CM | POA: Diagnosis present

## 2020-02-10 DIAGNOSIS — N183 Chronic kidney disease, stage 3 unspecified: Secondary | ICD-10-CM | POA: Diagnosis present

## 2020-02-10 DIAGNOSIS — Z8673 Personal history of transient ischemic attack (TIA), and cerebral infarction without residual deficits: Secondary | ICD-10-CM

## 2020-02-10 DIAGNOSIS — Z7982 Long term (current) use of aspirin: Secondary | ICD-10-CM

## 2020-02-10 DIAGNOSIS — I10 Essential (primary) hypertension: Secondary | ICD-10-CM | POA: Diagnosis present

## 2020-02-10 LAB — CBC
HCT: 41.5 % (ref 39.0–52.0)
Hemoglobin: 14 g/dL (ref 13.0–17.0)
MCH: 27.7 pg (ref 26.0–34.0)
MCHC: 33.7 g/dL (ref 30.0–36.0)
MCV: 82 fL (ref 80.0–100.0)
Platelets: 182 10*3/uL (ref 150–400)
RBC: 5.06 MIL/uL (ref 4.22–5.81)
RDW: 13.2 % (ref 11.5–15.5)
WBC: 12.4 10*3/uL — ABNORMAL HIGH (ref 4.0–10.5)
nRBC: 0 % (ref 0.0–0.2)

## 2020-02-10 LAB — COMPREHENSIVE METABOLIC PANEL
ALT: 27 U/L (ref 0–44)
AST: 43 U/L — ABNORMAL HIGH (ref 15–41)
Albumin: 3.7 g/dL (ref 3.5–5.0)
Alkaline Phosphatase: 74 U/L (ref 38–126)
Anion gap: 11 (ref 5–15)
BUN: 29 mg/dL — ABNORMAL HIGH (ref 8–23)
CO2: 21 mmol/L — ABNORMAL LOW (ref 22–32)
Calcium: 8.7 mg/dL — ABNORMAL LOW (ref 8.9–10.3)
Chloride: 98 mmol/L (ref 98–111)
Creatinine, Ser: 1.82 mg/dL — ABNORMAL HIGH (ref 0.61–1.24)
GFR calc Af Amer: 39 mL/min — ABNORMAL LOW (ref >60)
GFR calc non Af Amer: 34 mL/min — ABNORMAL LOW (ref >60)
Glucose, Bld: 156 mg/dL — ABNORMAL HIGH (ref 70–99)
Potassium: 4.1 mmol/L (ref 3.5–5.1)
Sodium: 130 mmol/L — ABNORMAL LOW (ref 135–145)
Total Bilirubin: 0.8 mg/dL (ref 0.3–1.2)
Total Protein: 7.9 g/dL (ref 6.5–8.1)

## 2020-02-10 LAB — SARS CORONAVIRUS 2 BY RT PCR (HOSPITAL ORDER, PERFORMED IN ~~LOC~~ HOSPITAL LAB): SARS Coronavirus 2: NEGATIVE

## 2020-02-10 LAB — TROPONIN I (HIGH SENSITIVITY): Troponin I (High Sensitivity): 17 ng/L (ref <18)

## 2020-02-10 LAB — LIPASE, BLOOD: Lipase: 26 U/L (ref 11–51)

## 2020-02-10 LAB — LACTIC ACID, PLASMA: Lactic Acid, Venous: 1.1 mmol/L (ref 0.5–1.9)

## 2020-02-10 MED ORDER — SODIUM CHLORIDE 0.9 % IV BOLUS
500.0000 mL | Freq: Once | INTRAVENOUS | Status: AC
  Administered 2020-02-10: 500 mL via INTRAVENOUS

## 2020-02-10 MED ORDER — ASPIRIN EC 81 MG PO TBEC
81.0000 mg | DELAYED_RELEASE_TABLET | Freq: Every day | ORAL | Status: DC
  Administered 2020-02-11 – 2020-02-14 (×4): 81 mg via ORAL
  Filled 2020-02-10 (×4): qty 1

## 2020-02-10 MED ORDER — HYDRALAZINE HCL 25 MG PO TABS
25.0000 mg | ORAL_TABLET | Freq: Three times a day (TID) | ORAL | Status: DC
  Administered 2020-02-11 – 2020-02-12 (×4): 25 mg via ORAL
  Filled 2020-02-10 (×5): qty 1

## 2020-02-10 MED ORDER — SODIUM CHLORIDE 0.9 % IV SOLN
1.0000 g | Freq: Once | INTRAVENOUS | Status: AC
  Administered 2020-02-10: 1 g via INTRAVENOUS
  Filled 2020-02-10: qty 10

## 2020-02-10 MED ORDER — SODIUM CHLORIDE 0.9 % IV SOLN
500.0000 mg | Freq: Once | INTRAVENOUS | Status: AC
  Administered 2020-02-11: 500 mg via INTRAVENOUS
  Filled 2020-02-10: qty 500

## 2020-02-10 MED ORDER — LEVOTHYROXINE SODIUM 75 MCG PO TABS
75.0000 ug | ORAL_TABLET | ORAL | Status: DC
  Administered 2020-02-12 – 2020-02-14 (×2): 75 ug via ORAL
  Filled 2020-02-10 (×2): qty 1

## 2020-02-10 MED ORDER — LATANOPROST 0.005 % OP SOLN
1.0000 [drp] | Freq: Every day | OPHTHALMIC | Status: DC
  Administered 2020-02-11 – 2020-02-13 (×3): 1 [drp] via OPHTHALMIC
  Filled 2020-02-10: qty 2.5

## 2020-02-10 MED ORDER — CYANOCOBALAMIN 500 MCG PO TABS
500.0000 ug | ORAL_TABLET | Freq: Every day | ORAL | Status: DC
  Administered 2020-02-11 – 2020-02-14 (×4): 500 ug via ORAL
  Filled 2020-02-10 (×5): qty 1

## 2020-02-10 MED ORDER — PANTOPRAZOLE SODIUM 40 MG PO TBEC
40.0000 mg | DELAYED_RELEASE_TABLET | Freq: Every day | ORAL | Status: DC
  Administered 2020-02-11 – 2020-02-14 (×4): 40 mg via ORAL
  Filled 2020-02-10 (×4): qty 1

## 2020-02-10 MED ORDER — LEVOTHYROXINE SODIUM 50 MCG PO TABS
50.0000 ug | ORAL_TABLET | ORAL | Status: DC
  Administered 2020-02-11 – 2020-02-13 (×2): 50 ug via ORAL
  Filled 2020-02-10 (×2): qty 1

## 2020-02-10 MED ORDER — ACETAMINOPHEN 500 MG PO TABS
1000.0000 mg | ORAL_TABLET | Freq: Once | ORAL | Status: AC
  Administered 2020-02-10: 1000 mg via ORAL
  Filled 2020-02-10: qty 2

## 2020-02-10 MED ORDER — CARVEDILOL 12.5 MG PO TABS
12.5000 mg | ORAL_TABLET | Freq: Two times a day (BID) | ORAL | Status: DC
  Administered 2020-02-11 – 2020-02-12 (×3): 12.5 mg via ORAL
  Filled 2020-02-10 (×4): qty 1

## 2020-02-10 MED ORDER — ROSUVASTATIN CALCIUM 5 MG PO TABS
2.5000 mg | ORAL_TABLET | Freq: Every day | ORAL | Status: DC
  Administered 2020-02-11 – 2020-02-14 (×4): 2.5 mg via ORAL
  Filled 2020-02-10 (×4): qty 1

## 2020-02-10 MED ORDER — AMLODIPINE BESYLATE 10 MG PO TABS
10.0000 mg | ORAL_TABLET | Freq: Every day | ORAL | Status: DC
  Administered 2020-02-11 – 2020-02-12 (×2): 10 mg via ORAL
  Filled 2020-02-10 (×2): qty 1

## 2020-02-10 MED ORDER — TAMSULOSIN HCL 0.4 MG PO CAPS
0.4000 mg | ORAL_CAPSULE | Freq: Every day | ORAL | Status: DC
  Administered 2020-02-12 – 2020-02-13 (×2): 0.4 mg via ORAL
  Filled 2020-02-10 (×3): qty 1

## 2020-02-10 NOTE — ED Notes (Signed)
Multiple attempts to obtain IV.  Only one set of blood cultures obtained prior to starting antibiotics.

## 2020-02-10 NOTE — H&P (Signed)
History and Physical    [name] TGG:[id number] DOB: 10/31/1934 DOA: 02/10/2020  PCP: Glenda Chroman, MD  Patient coming from: Home.  Chief Complaint: Fever and weakness.  HPI: [name] is a 85 y.o. male with history of CAD status post CABG and mitral valve repair in 2010 being followed by cardiologist Dr. Einar Gip with history of chronic kidney disease baseline creatinine around 1.6 as per the patient's son who provides most of the history has been experiencing fever and chills with weakness for the last 4 days.  Patient's fever has been running as high as 104 at home.  No definite productive cough though he has nonproductive cough which has been chronic.  No nausea vomiting diarrhea dysuria and rash or any abdominal pain.  Given the persistent symptoms patient was advised to come to the ER.  ED Course: In the ER patient is a fever of 101.9 Covid test was negative labs were significant for creatinine of 1.8 sodium 130 WBC 2.4 with chest x-ray showing right-sided infiltrates.  Blood cultures were drawn and started on empiric antibiotics for community-acquired pneumonia and admitted for further management.  Review of Systems: As per HPI, rest all negative.   Past Medical History:  Diagnosis Date  . CVA (cerebral infarction)    Significant carotid artery disease status post right carotid endarterectomy, postoperatively  . Depression   . GERD (gastroesophageal reflux disease)   . Heart murmur   . Hemorrhoids   . Hernia   . History of BPH   . Hyperlipemia   . Hypertension   . Hypothyroidism   . Mitral regurgitation     status post mitral valve repair at [facility] in [place]  . Status post coronary artery bypass grafting    Single-vessel coronary bypass grafting at time of mitral valve repair  . Stroke [facility]) Oct 2010  . Thyroid disease    hypothyroidism    Past Surgical History:  Procedure Laterality Date  . CAROTID ENDARTERECTOMY Right Nov. 2010     CEA  . CATARACT EXTRACTION    . CHOLECYSTECTOMY    . COLONOSCOPY N/A 05/30/2013   Procedure: COLONOSCOPY;  Surgeon: Rogene Houston, MD;  [facility];  Service: Endoscopy;  Laterality: N/A;  1230-rescheduled to 12:00 Ann notified pt  . CORONARY ARTERY BYPASS GRAFT    . ERCP     with biliary and pancreatic stenting  . INGUINAL HERNIA REPAIR  01/19/2011   Bilateral  . MITRAL VALVE REPAIR  April 2010  . MITRAL VALVE REPAIR  2010  . nevus removed    . TURP VAPORIZATION    . WART FULGURATION  01/03/2012   Procedure: FULGURATION ANAL WART;  Surgeon: Zenovia Jarred, MD;  Location: [place];  Service: General;  Laterality: N/A;  excision perianal condylomata     reports that he has never smoked. He has never used smokeless tobacco. He reports that he does not drink alcohol and does not use drugs.  Allergies  Allergen Reactions  . Ciprofloxacin Anaphylaxis and Nausea And Vomiting    High dose    Family History  Problem Relation Age of Onset  . Heart attack Father   . Colon cancer Neg Hx     Prior to Admission medications   Medication Sig Start Date End Date Taking? Authorizing Provider  amLODipine (NORVASC) 10 MG tablet TAKE ONE TABLET BY MOUTH ONE TIME DAILY 02/07/20   Adrian Prows, MD  aspirin EC  81 MG tablet Take 81 mg by mouth daily.    [provider]  bimatoprost (LUMIGAN) 0.01 % SOLN Place 1 drop into both eyes at bedtime.    [provider]  carvedilol (COREG) 12.5 MG tablet Take 1 tablet by mouth twice daily 02/07/20   Adrian Prows, MD  hydrALAZINE (APRESOLINE) 25 MG tablet Take 1 tablet (25 mg total) by mouth 3 (three) times daily. 10/01/19   Adrian Prows, MD  levothyroxine (LEVOTHROID) 50 MCG tablet Take 50 mcg by mouth every other day.     [provider]  levothyroxine (SYNTHROID) 75 MCG tablet Take 75 mcg by mouth every other day. Alternates every other day with 50mg     [provider]   losartan-hydrochlorothiazide (HYZAAR) 50-12.5 MG tablet Take 1 tablet by mouth daily. 04/04/19   Adrian Prows, MD  omeprazole (PRILOSEC) 20 MG capsule Take 20 mg by mouth at bedtime.    [provider]  rosuvastatin (CRESTOR) 5 MG tablet Take 2.5 mg by mouth daily.     [provider]  silodosin (RAPAFLO) 8 MG CAPS capsule Take 8 mg by mouth daily with breakfast.    [provider]  vitamin B-12 (CYANOCOBALAMIN) 500 MCG tablet Take 500 mcg by mouth daily.      [provider]    Physical Exam: Constitutional: Moderately built and nourished. Vitals:   02/10/20 1735 02/10/20 2130 02/10/20 2133 02/10/20 2307  BP: 136/66 (!) 168/71    Pulse: 85 95  93  Resp: 18 18  (!) 25  Temp: 99.1 F (37.3 C)  (!) 101.9 F (38.8 C) 100 F (37.8 C)  TempSrc: Oral  Oral Oral  SpO2: 96% 97%  97%   Eyes: Anicteric no pallor. ENMT: No discharge from the ears eyes nose or mouth. Neck: No mass felt.  No neck rigidity. Respiratory: No rhonchi or crepitations. Cardiovascular: S1-S2 heard. Abdomen: Soft nontender bowel sounds present. Musculoskeletal: No edema. Skin: No rash. Neurologic: Alert awake oriented to time place and person.  Moves all extremities. Psychiatric: Appears normal.  Normal affect.   Labs on Admission: I have personally reviewed following labs and imaging studies  CBC: Recent Labs  Lab 02/10/20 1831  WBC 12.4*  HGB 14.0  HCT 41.5  MCV 82.0  PLT 960   Basic Metabolic Panel: Recent Labs  Lab 02/10/20 1831  NA 130*  K 4.1  CL 98  CO2 21*  GLUCOSE 156*  BUN 29*  CREATININE 1.82*  CALCIUM 8.7*   GFR: CrCl cannot be calculated (Unknown ideal weight.). Liver Function Tests: Recent Labs  Lab 02/10/20 1831  AST 43*  ALT 27  ALKPHOS 74  BILITOT 0.8  PROT 7.9  ALBUMIN 3.7   Recent Labs  Lab 02/10/20 1831  LIPASE 26   No results for input(s): AMMONIA in the last 168 hours. Coagulation Profile: No results for input(s): INR,  PROTIME in the last 168 hours. Cardiac Enzymes: No results for input(s): CKTOTAL, CKMB, CKMBINDEX, TROPONINI in the last 168 hours. BNP (last 3 results) No results for input(s): PROBNP in the last 8760 hours. HbA1C: No results for input(s): HGBA1C in the last 72 hours. CBG: No results for input(s): GLUCAP in the last 168 hours. Lipid Profile: No results for input(s): CHOL, HDL, LDLCALC, TRIG, CHOLHDL, LDLDIRECT in the last 72 hours. Thyroid Function Tests: No results for input(s): TSH, T4TOTAL, FREET4, T3FREE, THYROIDAB in the last 72 hours. Anemia Panel: No results for input(s): VITAMINB12, FOLATE, FERRITIN, TIBC, IRON, RETICCTPCT in  the last 72 hours. Urine analysis:    Component Value Date/Time   COLORURINE YELLOW 04/21/2009 [facility] 04/21/2009 1143   LABSPEC 1.020 04/21/2009 1143   PHURINE 6.0 04/21/2009 1143   GLUCOSEU NEGATIVE 04/21/2009 1143   GLUCOSEU NEGATIVE 11/20/2008 1247   HGBUR NEGATIVE 04/21/2009 1143   BILIRUBINUR NEGATIVE 04/21/2009 1143   KETONESUR NEGATIVE 04/21/2009 1143   PROTEINUR NEGATIVE 04/21/2009 1143   UROBILINOGEN 0.2 04/21/2009 1143   NITRITE NEGATIVE 04/21/2009 1143   LEUKOCYTESUR  04/21/2009 1143    NEGATIVE MICROSCOPIC NOT DONE ON URINES WITH NEGATIVE PROTEIN, BLOOD, LEUKOCYTES, NITRITE, OR GLUCOSE <1000 mg/dL.   Sepsis Labs: @LABRCNTIP (procalcitonin:4,lacticidven:4) ) Recent Results (from the past 240 hour(s))  SARS Coronavirus 2 by RT PCR (hospital order, performed in [facility] hospital lab) Nasopharyngeal Nasopharyngeal Swab     Status: None   Collection Time: 02/10/20 10:10 PM   Specimen: Nasopharyngeal Swab  Result Value Ref Range Status   SARS Coronavirus 2 NEGATIVE NEGATIVE Final    Comment: (NOTE) SARS-CoV-2 target nucleic acids are NOT DETECTED.  The SARS-CoV-2 RNA is generally detectable in upper and lower respiratory specimens during the acute phase of infection. The lowest concentration of SARS-CoV-2 viral  copies this assay can detect is 250 copies / mL. A negative result does not preclude SARS-CoV-2 infection and should not be used as the sole basis for treatment or other patient management decisions.  A negative result may occur with improper specimen collection / handling, submission of specimen other than nasopharyngeal swab, presence of viral mutation(s) within the areas targeted by this assay, and inadequate number of viral copies (<250 copies / mL). A negative result must be combined with clinical observations, patient history, and epidemiological information.  Fact Sheet for Patients:   StrictlyIdeas.no  Fact Sheet for Healthcare Providers: BankingDealers.co.za  This test is not yet approved or  cleared by the [place] FDA and has been authorized for detection and/or diagnosis of SARS-CoV-2 by FDA under an Emergency Use Authorization (EUA).  This EUA will remain in effect (meaning this test can be used) for the duration of the COVID-19 declaration under Section 564(b)(1) of the Act, 21 U.S.C. section 360bbb-3(b)(1), unless the authorization is terminated or revoked sooner.  Performed at [facility], [place] [address] [place], [place] [address]      Radiological Exams on Admission: DG Chest 2 View  Result Date: 02/10/2020 CLINICAL DATA:  Fever. EXAM: CHEST - 2 VIEW COMPARISON:  January 06, 2011 FINDINGS: The lungs are hyperinflated. Multiple sternal wires and vascular clips are noted. Mild slightly patchy infiltrate is seen within the right lung base. There is no evidence of a pleural effusion or pneumothorax. The heart size and mediastinal contours are within normal limits. An artificial cardiac valve is seen. The visualized skeletal structures are unremarkable. Radiopaque surgical clips are seen within the right upper quadrant. IMPRESSION: 1. Evidence of prior median sternotomy/CABG. 2. Mild right basilar  infiltrate. Electronically Signed   By: Virgina Norfolk M.D.   On: 02/10/2020 20:53    EKG: Independently reviewed.  Normal sinus rhythm.  RBBB.  Assessment/Plan Principal Problem:   CAP (community acquired pneumonia) Active Problems:   Status post coronary artery bypass grafting   OSA (obstructive sleep apnea)   Essential hypertension   CKD (chronic kidney disease) stage 3, GFR 30-59 ml/min    1. Community-acquired pneumonia -patient is placed empirically on ceftriaxone and Zithromax.  Will check urine for Legionella strep antigen follow blood cultures  continue with gentle hydration.  Sputum cultures if patient produces any. 2. CAD status post CABG and mitral valve repair appears compensated and also denies any chest pain.  Troponins were negative.  We will continue with aspirin statins and beta-blockers. 3. Hypertension we will continue with amlodipine and beta-blocker we will hold off patient's Hyzaar since creatinine mildly elevated.  As needed IV hydralazine. 4. Chronic kidney disease stage III likely with mildly elevated creatinine could be from dehydration.  We will gently hydrate and if creatinine improves restart Hyzaar. 5. Hypothyroidism on levothyroxine. 6. History of sleep apnea.  Since patient has community-acquired pneumonia with multiple other comorbidities with early septic picture will need close monitoring for any further worsening in inpatient status.  If patient blood cultures grew positive then will need opinion from cardiologist given history of mitral valve repair.   DVT prophylaxis: Lovenox. Code Status: Full code. Family Communication: Patient's son. Disposition Plan: Home. Consults called: None. Admission status: Inpatient.   Rise Patience MD [facility] Pager [phone].  If 7PM-7AM, please contact night-coverage [email or web address] Password [facility]  02/10/2020, 11:58 PM

## 2020-02-10 NOTE — ED Provider Notes (Signed)
[facility] DEPT Provider Note   CSN: [id number] Arrival date & time: 02/10/20  1722     History Chief Complaint  Patient presents with  . Fever    [name] is a 85 y.o. male.  85 year old male with prior medical history as detailed below presents for evaluation.  Patient with fever and chills for the last 4 to 5 days.  Patient with multiple temperatures as high as 103-104.  Patient with mild cough.  No known exposure to Covid.  The history is provided by the patient, a relative and medical records.  Illness Location:  Fever, cough, myalgia Severity:  Moderate Onset quality:  Gradual Duration:  5 days Timing:  Constant Progression:  Worsening Chronicity:  New Associated symptoms: fever        Past Medical History:  Diagnosis Date  . CVA (cerebral infarction)    Significant carotid artery disease status post right carotid endarterectomy, postoperatively  . Depression   . GERD (gastroesophageal reflux disease)   . Heart murmur   . Hemorrhoids   . Hernia   . History of BPH   . Hyperlipemia   . Hypertension   . Hypothyroidism   . Mitral regurgitation     status post mitral valve repair at [facility] in [place]  . Status post coronary artery bypass grafting    Single-vessel coronary bypass grafting at time of mitral valve repair  . Stroke [facility]) Oct 2010  . Thyroid disease    hypothyroidism    Patient Active Problem List   Diagnosis Date Noted  . OSA (obstructive sleep apnea) 04/21/2017  . Aftercare following surgery of the circulatory system, [place] 01/10/2014  . Condyloma acuminatum of perianal region 08/04/2011  . Mitral regurgitation   . CVA (cerebral infarction)   . Status post coronary artery bypass grafting   . Hernia   . FATIGUE 06/17/2009  . DYSPHAGIA OROPHARYNGEAL PHASE 06/17/2009  . DEPRESSION 04/16/2009  . CAROTID ARTERY DISEASE 04/16/2009    Past Surgical History:  Procedure  Laterality Date  . CAROTID ENDARTERECTOMY Right Nov. 2010   CEA  . CATARACT EXTRACTION    . CHOLECYSTECTOMY    . COLONOSCOPY N/A 05/30/2013   Procedure: COLONOSCOPY;  Surgeon: Rogene Houston, MD;  [facility];  Service: Endoscopy;  Laterality: N/A;  1230-rescheduled to 12:00 Ann notified pt  . CORONARY ARTERY BYPASS GRAFT    . ERCP     with biliary and pancreatic stenting  . INGUINAL HERNIA REPAIR  01/19/2011   Bilateral  . MITRAL VALVE REPAIR  April 2010  . MITRAL VALVE REPAIR  2010  . nevus removed    . TURP VAPORIZATION    . WART FULGURATION  01/03/2012   Procedure: FULGURATION ANAL WART;  Surgeon: Zenovia Jarred, MD;  Location: [place];  Service: General;  Laterality: N/A;  excision perianal condylomata       Family History  Problem Relation Age of Onset  . Heart attack Father   . Colon cancer Neg Hx     Social History   Tobacco Use  . Smoking status: Never Smoker  . Smokeless tobacco: Never Used  Vaping Use  . Vaping Use: Never used  Substance Use Topics  . Alcohol use: No  . Drug use: No    Home Medications Prior to Admission medications   Medication Sig Start Date End Date Taking? Authorizing Provider  amLODipine (NORVASC) 10 MG tablet TAKE ONE TABLET BY  MOUTH ONE TIME DAILY 02/07/20   Adrian Prows, MD  aspirin EC 81 MG tablet Take 81 mg by mouth daily.    [provider]  bimatoprost (LUMIGAN) 0.01 % SOLN Place 1 drop into both eyes at bedtime.    [provider]  carvedilol (COREG) 12.5 MG tablet Take 1 tablet by mouth twice daily 02/07/20   Adrian Prows, MD  hydrALAZINE (APRESOLINE) 25 MG tablet Take 1 tablet (25 mg total) by mouth 3 (three) times daily. 10/01/19   Adrian Prows, MD  levothyroxine (LEVOTHROID) 50 MCG tablet Take 50 mcg by mouth every other day.     [provider]  levothyroxine (SYNTHROID) 75 MCG tablet Take 75 mcg by mouth every other day. Alternates every other day with 50mg     [provider]  losartan-hydrochlorothiazide (HYZAAR) 50-12.5 MG tablet Take 1 tablet by mouth daily. 04/04/19   Adrian Prows, MD  omeprazole (PRILOSEC) 20 MG capsule Take 20 mg by mouth at bedtime.    [provider]  rosuvastatin (CRESTOR) 5 MG tablet Take 2.5 mg by mouth daily.     [provider]  silodosin (RAPAFLO) 8 MG CAPS capsule Take 8 mg by mouth daily with breakfast.    [provider]  vitamin B-12 (CYANOCOBALAMIN) 500 MCG tablet Take 500 mcg by mouth daily.      [provider]    Allergies    Ciprofloxacin  Review of Systems   Review of Systems  Constitutional: Positive for fever.  All other systems reviewed and are negative.   Physical Exam Updated Vital Signs BP 136/66   Pulse 85   Temp 99.1 F (37.3 C) (Oral)   Resp 18   SpO2 96%   Physical Exam Vitals and nursing note reviewed.  Constitutional:      General: He is not in acute distress.    Appearance: He is well-developed. He is ill-appearing.  HENT:     Head: Normocephalic and atraumatic.  Eyes:     Conjunctiva/sclera: Conjunctivae normal.     Pupils: Pupils are equal, round, and reactive to light.  Cardiovascular:     Rate and Rhythm: Normal rate and regular rhythm.     Heart sounds: Normal heart sounds.  Pulmonary:     Effort: Pulmonary effort is normal. No respiratory distress.     Breath sounds: Normal breath sounds.  Abdominal:     General: There is no distension.     Palpations: Abdomen is soft.     Tenderness: There is no abdominal tenderness.  Musculoskeletal:        General: No deformity. Normal range of motion.     Cervical back: Normal range of motion and neck supple.  Skin:    General: Skin is warm and dry.  Neurological:     Mental Status: He is alert and oriented to person, place, and time.     ED Results / Procedures / Treatments   Labs (all labs ordered are listed, but only abnormal results are displayed) Labs Reviewed  COMPREHENSIVE  METABOLIC PANEL - Abnormal; Notable for the following components:      Result Value   Sodium 130 (*)    CO2 21 (*)    Glucose, Bld 156 (*)    BUN 29 (*)    Creatinine, Ser 1.82 (*)    Calcium 8.7 (*)    AST 43 (*)    GFR calc non Af Amer 34 (*)    GFR calc Af Amer 39 (*)  All other components within normal limits  CBC - Abnormal; Notable for the following components:   WBC 12.4 (*)    All other components within normal limits  SARS CORONAVIRUS 2 BY RT PCR (HOSPITAL ORDER, [place] [facility])  CULTURE, BLOOD (ROUTINE X 2)  CULTURE, BLOOD (ROUTINE X 2)  LIPASE, BLOOD  URINALYSIS, ROUTINE W REFLEX MICROSCOPIC  DIFFERENTIAL  LACTIC ACID, PLASMA  LACTIC ACID, PLASMA  TROPONIN I (HIGH SENSITIVITY)    EKG EKG Interpretation  Date/Time:  Sunday February 10 2020 21:08:20 EDT Ventricular Rate:  95 PR Interval:    QRS Duration: 156 QT Interval:  418 QTC Calculation: 526 R Axis:   -84 Text Interpretation: Poor baseline Right bundle branch block Probable anterior infarct, age indeterminate Confirmed by Dene Gentry ([id number]) on 02/10/2020 9:16:49 PM   Radiology DG Chest 2 View  Result Date: 02/10/2020 CLINICAL DATA:  Fever. EXAM: CHEST - 2 VIEW COMPARISON:  January 06, 2011 FINDINGS: The lungs are hyperinflated. Multiple sternal wires and vascular clips are noted. Mild slightly patchy infiltrate is seen within the right lung base. There is no evidence of a pleural effusion or pneumothorax. The heart size and mediastinal contours are within normal limits. An artificial cardiac valve is seen. The visualized skeletal structures are unremarkable. Radiopaque surgical clips are seen within the right upper quadrant. IMPRESSION: 1. Evidence of prior median sternotomy/CABG. 2. Mild right basilar infiltrate. Electronically Signed   By: Virgina Norfolk M.D.   On: 02/10/2020 20:53    Procedures Procedures (including critical care time)  Medications Ordered in ED Medications    sodium chloride 0.9 % bolus 500 mL (has no administration in time range)  cefTRIAXone (ROCEPHIN) 1 g in sodium chloride 0.9 % 100 mL IVPB (has no administration in time range)  azithromycin (ZITHROMAX) 500 mg in sodium chloride 0.9 % 250 mL IVPB (has no administration in time range)    ED Course  I have reviewed the triage vital signs and the nursing notes.  Pertinent labs & imaging results that were available during my care of the patient were reviewed by me and considered in my medical decision making (see chart for details).    MDM Rules/Calculators/A&P                          MDM  Screen complete  [name] was evaluated in Emergency Department on 02/10/2020 for the symptoms described in the history of present illness. He was evaluated in the context of the global COVID-19 pandemic, which necessitated consideration that the patient might be at risk for infection with the SARS-CoV-2 virus that causes COVID-19. Institutional protocols and algorithms that pertain to the evaluation of patients at risk for COVID-19 are in a state of rapid change based on information released by regulatory bodies including the CDC and federal and state organizations. These policies and algorithms were followed during the patient's care in the ED.  Patient is presenting with complaint of persistent fever over the last 4 to 5 days. Chest x-ray demonstrates evidence of right sided pneumonia.  Antibiotics initiated in the ED.  Covid screen pending at time of admission.   Patient would benefit from admission.   Hospitalist service is aware of case and will admit.     Final Clinical Impression(s) / ED Diagnoses Final diagnoses:  Community acquired pneumonia, unspecified laterality    Rx / DC Orders ED Discharge Orders    None  Valarie Merino, MD 02/10/20 [phone]

## 2020-02-10 NOTE — ED Triage Notes (Signed)
Family states that chills, no appetite, fevers started Wed and fevers not relieved with Tylenol every 4 hours, last dose 1pm. No problems with n/v/d or urinary.

## 2020-02-11 DIAGNOSIS — Z951 Presence of aortocoronary bypass graft: Secondary | ICD-10-CM

## 2020-02-11 DIAGNOSIS — N179 Acute kidney failure, unspecified: Secondary | ICD-10-CM

## 2020-02-11 DIAGNOSIS — G4733 Obstructive sleep apnea (adult) (pediatric): Secondary | ICD-10-CM

## 2020-02-11 LAB — BASIC METABOLIC PANEL
Anion gap: 10 (ref 5–15)
BUN: 25 mg/dL — ABNORMAL HIGH (ref 8–23)
CO2: 21 mmol/L — ABNORMAL LOW (ref 22–32)
Calcium: 8.1 mg/dL — ABNORMAL LOW (ref 8.9–10.3)
Chloride: 101 mmol/L (ref 98–111)
Creatinine, Ser: 1.58 mg/dL — ABNORMAL HIGH (ref 0.61–1.24)
GFR calc Af Amer: 47 mL/min — ABNORMAL LOW (ref >60)
GFR calc non Af Amer: 40 mL/min — ABNORMAL LOW (ref >60)
Glucose, Bld: 110 mg/dL — ABNORMAL HIGH (ref 70–99)
Potassium: 3.5 mmol/L (ref 3.5–5.1)
Sodium: 132 mmol/L — ABNORMAL LOW (ref 135–145)

## 2020-02-11 LAB — URINALYSIS, ROUTINE W REFLEX MICROSCOPIC
Bacteria, UA: NONE SEEN
Bilirubin Urine: NEGATIVE
Glucose, UA: NEGATIVE mg/dL
Ketones, ur: 5 mg/dL — AB
Leukocytes,Ua: NEGATIVE
Nitrite: NEGATIVE
Protein, ur: 100 mg/dL — AB
Specific Gravity, Urine: 1.025 (ref 1.005–1.030)
pH: 5 (ref 5.0–8.0)

## 2020-02-11 LAB — CBC
HCT: 36.8 % — ABNORMAL LOW (ref 39.0–52.0)
Hemoglobin: 12.3 g/dL — ABNORMAL LOW (ref 13.0–17.0)
MCH: 27.5 pg (ref 26.0–34.0)
MCHC: 33.4 g/dL (ref 30.0–36.0)
MCV: 82.1 fL (ref 80.0–100.0)
Platelets: 148 10*3/uL — ABNORMAL LOW (ref 150–400)
RBC: 4.48 MIL/uL (ref 4.22–5.81)
RDW: 13.1 % (ref 11.5–15.5)
WBC: 10.6 10*3/uL — ABNORMAL HIGH (ref 4.0–10.5)
nRBC: 0 % (ref 0.0–0.2)

## 2020-02-11 LAB — STREP PNEUMONIAE URINARY ANTIGEN: Strep Pneumo Urinary Antigen: NEGATIVE

## 2020-02-11 MED ORDER — ALBUTEROL SULFATE (2.5 MG/3ML) 0.083% IN NEBU: 2.5000 mg | INHALATION_SOLUTION | Freq: Four times a day (QID) | RESPIRATORY_TRACT | Status: DC

## 2020-02-11 MED ORDER — BENZONATATE 100 MG PO CAPS
100.0000 mg | ORAL_CAPSULE | Freq: Two times a day (BID) | ORAL | Status: DC | PRN
  Administered 2020-02-14: 100 mg via ORAL
  Filled 2020-02-11 (×2): qty 1

## 2020-02-11 MED ORDER — ALBUTEROL SULFATE (2.5 MG/3ML) 0.083% IN NEBU: 2.5000 mg | INHALATION_SOLUTION | Freq: Four times a day (QID) | RESPIRATORY_TRACT | Status: DC | PRN

## 2020-02-11 MED ORDER — ADULT MULTIVITAMIN W/MINERALS CH
1.0000 | ORAL_TABLET | Freq: Every day | ORAL | Status: DC
  Administered 2020-02-11 – 2020-02-14 (×4): 1 via ORAL
  Filled 2020-02-11 (×4): qty 1

## 2020-02-11 MED ORDER — ENOXAPARIN SODIUM 30 MG/0.3ML ~~LOC~~ SOLN
30.0000 mg | SUBCUTANEOUS | Status: DC
  Administered 2020-02-11 – 2020-02-12 (×2): 30 mg via SUBCUTANEOUS
  Filled 2020-02-11 (×2): qty 0.3

## 2020-02-11 MED ORDER — SODIUM CHLORIDE 0.9 % IV SOLN: INTRAVENOUS | Status: AC

## 2020-02-11 MED ORDER — ACETAMINOPHEN 325 MG PO TABS
650.0000 mg | ORAL_TABLET | Freq: Four times a day (QID) | ORAL | Status: DC | PRN
  Administered 2020-02-11 (×2): 650 mg via ORAL
  Filled 2020-02-11 (×2): qty 2

## 2020-02-11 MED ORDER — ACETAMINOPHEN 650 MG RE SUPP: 650.0000 mg | Freq: Four times a day (QID) | RECTAL | Status: DC | PRN

## 2020-02-11 MED ORDER — SODIUM CHLORIDE 0.9 % IV SOLN
2.0000 g | INTRAVENOUS | Status: DC
  Administered 2020-02-11 – 2020-02-14 (×4): 2 g via INTRAVENOUS
  Filled 2020-02-11: qty 20
  Filled 2020-02-11 (×3): qty 2

## 2020-02-11 MED ORDER — SODIUM CHLORIDE 0.9 % IV SOLN
500.0000 mg | INTRAVENOUS | Status: DC
  Administered 2020-02-11 – 2020-02-13 (×3): 500 mg via INTRAVENOUS
  Filled 2020-02-11 (×3): qty 500

## 2020-02-11 MED ORDER — ONDANSETRON HCL 4 MG/2ML IJ SOLN: 4.0000 mg | Freq: Four times a day (QID) | INTRAMUSCULAR | Status: DC | PRN

## 2020-02-11 MED ORDER — ONDANSETRON HCL 4 MG PO TABS: 4.0000 mg | ORAL_TABLET | Freq: Four times a day (QID) | ORAL | Status: DC | PRN

## 2020-02-11 MED ORDER — GUAIFENESIN ER 600 MG PO TB12
600.0000 mg | ORAL_TABLET | Freq: Two times a day (BID) | ORAL | Status: DC
  Administered 2020-02-11 – 2020-02-14 (×6): 600 mg via ORAL
  Filled 2020-02-11 (×6): qty 1

## 2020-02-11 MED ORDER — ALBUTEROL SULFATE (2.5 MG/3ML) 0.083% IN NEBU: 2.5000 mg | INHALATION_SOLUTION | Freq: Three times a day (TID) | RESPIRATORY_TRACT | Status: DC

## 2020-02-11 MED ORDER — ENSURE ENLIVE PO LIQD
237.0000 mL | Freq: Two times a day (BID) | ORAL | Status: DC
  Administered 2020-02-12 – 2020-02-14 (×3): 237 mL via ORAL

## 2020-02-11 NOTE — Progress Notes (Addendum)
PROGRESS NOTE    [name]  YKD:[id number]  DOB: 1935/01/24  PCP: Glenda Chroman, MD Admit date:02/10/2020 Chief compliant:  85 y.o. male with history of CAD status post CABG and mitral valve repair in 2010 being followed by cardiologist Dr. Einar Gip , chronic kidney disease baseline creatinine around 1.6 . Per patient's son, patient been experiencing fever with Tmax 104/ chills with weakness for the last 4 days with nonproductive cough ? chronic.  Given the persistent symptoms patient was advised to come to the ER. ED Course: Febrile , Tmax 101.9. WBC 2.4 with CXR showing right-sided infiltrates.  Blood cultures were drawn and started on empiric antibiotics for community-acquired pneumonia. SARS CoV PCR -ve. Hospital course: Patient admitted to [facility] for CAP   Subjective:  Patient resting comfortably.  Feels better today.  Wife at bedside.  States he attended a family event recently with 3 people and feels he might have caught an infection from there.  He however tested negative for Covid on this admission.  Reports dry cough.  Objective: Vitals:   02/11/20 0502 02/11/20 0525 02/11/20 0705 02/11/20 0808  BP:  (!) 158/74 (!) 143/73 (!) 148/65  Pulse:  87 79 85  Resp:  18 18 20   Temp: (!) 101.6 F (38.7 C) (!) 101.6 F (38.7 C) 99.8 F (37.7 C) 98.4 F (36.9 C)  TempSrc: Oral Oral Oral Oral  SpO2:  94% 94% 94%  Weight:      Height:        Intake/Output Summary (Last 24 hours) at 02/11/2020 0835 Last data filed at 02/11/2020 0130 Gross per 24 hour  Intake 249.07 ml  Output --  Net 249.07 ml   Filed Weights   02/11/20 0017 02/11/20 0413  Weight: 65.8 kg 60.9 kg    Physical Examination:  General: Moderately built, no acute distress noted Head ENT: Atraumatic normocephalic, PERRLA, neck supple Heart: S1-S2 heard, regular rate and rhythm, no murmurs.  No leg edema noted Lungs: Equal air entry bilaterally, no rhonchi or rales on exam, no accessory muscle use Abdomen:  Bowel sounds heard, soft, nontender, nondistended. No organomegaly.  No CVA tenderness Extremities: No pedal edema.  No cyanosis or clubbing. Neurological: Awake alert oriented x3, no focal weakness or numbness, strength and sensations to crude touch intact Skin: No wounds or rashes.     Data Reviewed: I have personally reviewed following labs and imaging studies  CBC: Recent Labs  Lab 02/10/20 1831  WBC 12.4*  HGB 14.0  HCT 41.5  MCV 82.0  PLT 397   Basic Metabolic Panel: Recent Labs  Lab 02/10/20 1831  NA 130*  K 4.1  CL 98  CO2 21*  GLUCOSE 156*  BUN 29*  CREATININE 1.82*  CALCIUM 8.7*   GFR: Estimated Creatinine Clearance: 27 mL/min (A) (by C-G formula based on SCr of 1.82 mg/dL (H)). Liver Function Tests: Recent Labs  Lab 02/10/20 1831  AST 43*  ALT 27  ALKPHOS 74  BILITOT 0.8  PROT 7.9  ALBUMIN 3.7   Recent Labs  Lab 02/10/20 1831  LIPASE 26   No results for input(s): AMMONIA in the last 168 hours. Coagulation Profile: No results for input(s): INR, PROTIME in the last 168 hours. Cardiac Enzymes: No results for input(s): CKTOTAL, CKMB, CKMBINDEX, TROPONINI in the last 168 hours. BNP (last 3 results) No results for input(s): PROBNP in the last 8760 hours. HbA1C: No results for input(s): HGBA1C in the last 72 hours. CBG: No results for input(s): GLUCAP  in the last 168 hours. Lipid Profile: No results for input(s): CHOL, HDL, LDLCALC, TRIG, CHOLHDL, LDLDIRECT in the last 72 hours. Thyroid Function Tests: No results for input(s): TSH, T4TOTAL, FREET4, T3FREE, THYROIDAB in the last 72 hours. Anemia Panel: No results for input(s): VITAMINB12, FOLATE, FERRITIN, TIBC, IRON, RETICCTPCT in the last 72 hours. Sepsis Labs: Recent Labs  Lab 02/10/20 2210  LATICACIDVEN 1.1    Recent Results (from the past 240 hour(s))  SARS Coronavirus 2 by RT PCR (hospital order, performed in [facility] hospital lab) Nasopharyngeal Nasopharyngeal Swab     Status:  None   Collection Time: 02/10/20 10:10 PM   Specimen: Nasopharyngeal Swab  Result Value Ref Range Status   SARS Coronavirus 2 NEGATIVE NEGATIVE Final    Comment: (NOTE) SARS-CoV-2 target nucleic acids are NOT DETECTED.  The SARS-CoV-2 RNA is generally detectable in upper and lower respiratory specimens during the acute phase of infection. The lowest concentration of SARS-CoV-2 viral copies this assay can detect is 250 copies / mL. A negative result does not preclude SARS-CoV-2 infection and should not be used as the sole basis for treatment or other patient management decisions.  A negative result may occur with improper specimen collection / handling, submission of specimen other than nasopharyngeal swab, presence of viral mutation(s) within the areas targeted by this assay, and inadequate number of viral copies (<250 copies / mL). A negative result must be combined with clinical observations, patient history, and epidemiological information.  Fact Sheet for Patients:   StrictlyIdeas.no  Fact Sheet for Healthcare Providers: BankingDealers.co.za  This test is not yet approved or  cleared by the [place] FDA and has been authorized for detection and/or diagnosis of SARS-CoV-2 by FDA under an Emergency Use Authorization (EUA).  This EUA will remain in effect (meaning this test can be used) for the duration of the COVID-19 declaration under Section 564(b)(1) of the Act, 21 U.S.C. section 360bbb-3(b)(1), unless the authorization is terminated or revoked sooner.  Performed at [facility], [place] [address], [place], [place] [address]       Radiology Studies: DG Chest 2 View  Result Date: 02/10/2020 CLINICAL DATA:  Fever. EXAM: CHEST - 2 VIEW COMPARISON:  January 06, 2011 FINDINGS: The lungs are hyperinflated. Multiple sternal wires and vascular clips are noted. Mild slightly patchy infiltrate is seen within the  right lung base. There is no evidence of a pleural effusion or pneumothorax. The heart size and mediastinal contours are within normal limits. An artificial cardiac valve is seen. The visualized skeletal structures are unremarkable. Radiopaque surgical clips are seen within the right upper quadrant. IMPRESSION: 1. Evidence of prior median sternotomy/CABG. 2. Mild right basilar infiltrate. Electronically Signed   By: Virgina Norfolk M.D.   On: 02/10/2020 20:53      Scheduled Meds: . amLODipine  10 mg Oral Daily  . aspirin EC  81 mg Oral Daily  . carvedilol  12.5 mg Oral BID WC  . enoxaparin (LOVENOX) injection  30 mg Subcutaneous Q24H  . hydrALAZINE  25 mg Oral TID  . latanoprost  1 drop Both Eyes QHS  . levothyroxine  50 mcg Oral QODAY  . [START ON 02/12/2020] levothyroxine  75 mcg Oral QODAY  . pantoprazole  40 mg Oral Daily  . rosuvastatin  2.5 mg Oral Daily  . tamsulosin  0.4 mg Oral QPC supper  . vitamin B-12  500 mcg Oral Daily   Continuous Infusions: . sodium chloride 75 mL/hr at 02/11/20 0511  .  azithromycin    . cefTRIAXone (ROCEPHIN)  IV        Assessment/Plan:  1. Community-acquired pneumonia -patient is placed empirically on ceftriaxone and Zithromax.    Labs including urine for Legionella/strep antigen sent from ED, follow blood cultures continue with gentle hydration.    Afebrile today, added albuterol and guaifenesin for symptomatic relief.  Tessalon Perles as needed 2. AKI: Baseline creatinine appears to be 0.8 to 1.0.  Creatinine elevated at 1.8 on presentation and improving to 1.5 today with IV hydration.  Hyzaar on hold. 3. Hypertension we will continue with amlodipine and beta-blocker we will hold off patient's Hyzaar since creatinine mildly elevated.  As needed IV hydralazine.  Noted that patient's SBP fluctuating between 10 1-1 50s.  Will check orthostatics. 4. CAD status post CABG and mitral valve repair appears compensated and also denies any chest pain.   Troponins were negative.  We will continue with aspirin statins and beta-blockers. 5. Hypothyroidism on levothyroxine. 6. Hyponatremia: Likely secondary to dehydration and chronic diuretic use.  Improving with IV hydration.  Hyzaar held. 7. History of sleep apnea.   DVT prophylaxis: Lovenox Code Status: Full code Family / Patient Communication: Discussed with patient and wife at bedside. Disposition Plan:   Status is: Inpatient  Remains inpatient appropriate because:IV treatments appropriate due to intensity of illness or inability to take PO   Dispo: The patient is from: Home              Anticipated d/c is to: Home              Anticipated d/c date is: 1 day if AKI resolves and remains afebrile.              Patient currently is not medically stable to d/c.            Time spent: 25 mins     >50% time spent in discussions with care team and coordination of care.    Guilford Shi, MD [facility] Pager in [place]  If 7PM-7AM, please contact night-coverage [email or web address] 02/11/2020, 8:35 AM

## 2020-02-11 NOTE — Progress Notes (Signed)
Contacted by son, Mylez, [place] patient Son an update was provided as requested. Additionally the patient's son would like to be able to be here during rounding to ensure appropriate advocacy as he is concerned that the patient's other support person does not understand as much of the medical language as would be helpful. It may be appropriate in this situation to allow for the patient's advocate and HCPOA to be present during rounding and then allow for them to swap out with the patients support person. This patient is [employer] and orders need to be entered appropriately as the patient either received no food or incorrect food from the cafeteria.

## 2020-02-11 NOTE — Progress Notes (Signed)
Initial Nutrition Assessment  DOCUMENTATION CODES:   Not applicable  INTERVENTION:  Ensure Enlive po BID, each supplement provides 350 kcal and 20 grams of protein  MVI daily   NUTRITION DIAGNOSIS:   Predicted suboptimal nutrient intake related to decreased appetite as evidenced by other (comment) (per MST report).    GOAL:   Patient will meet greater than or equal to 90% of their needs    MONITOR:   PO intake, Supplement acceptance, Labs, I & O's, Weight trends  REASON FOR ASSESSMENT:   Malnutrition Screening Tool    ASSESSMENT:   Pt admitted with CAP. PMH includes CAD s/p CABG and mitral valve repair, GERD, HLD, CVA, HTN, hypothyroidism.  Pt unavailable at time of RD visit.   Reviewed wt history. No significant wt changes noted.   No PO intake documented.  Labs: Na 132 (L) Medications: Protonix, Vitamin B12  NUTRITION - FOCUSED PHYSICAL EXAM:  Unable to perform at this time; will attempt at follow-up  Diet Order:   Diet Order            Diet Heart Room service appropriate? Yes; Fluid consistency: Thin  Diet effective now                 EDUCATION NEEDS:   No education needs have been identified at this time  Skin:  Skin Assessment: Reviewed RN Assessment  Last BM:  8/29  Height:   Ht Readings from Last 1 Encounters:  02/11/20 5\' 6"  (1.676 m)    Weight:   Wt Readings from Last 1 Encounters:  02/11/20 60.9 kg   BMI:  Body mass index is 21.67 kg/m.  Estimated Nutritional Needs:   Kcal:  1650-1850  Protein:  80-90 grams  Fluid:  >/=1.65L/d    Larkin Ina, MS, RD, LDN RD pager number and weekend/on-call pager number located in [place].

## 2020-02-11 NOTE — Progress Notes (Signed)
°   02/11/20 0525  Assess: MEWS Score  Temp (!) 101.6 F (38.7 C)  BP (!) 158/74  Pulse Rate 87  Resp 18  Level of Consciousness Alert  SpO2 94 %  O2 Device Room Air  Assess: MEWS Score  MEWS Temp 2  MEWS Systolic 0  MEWS Pulse 0  MEWS RR 0  MEWS LOC 0  MEWS Score 2  MEWS Score Color Yellow  Assess: if the MEWS score is Yellow or Red  Were vital signs taken at a resting state? Yes  Focused Assessment No change from prior assessment  Early Detection of Sepsis Score *See Row Information* Medium  MEWS guidelines implemented *See Row Information* Yes  Treat  MEWS Interventions Administered prn meds/treatments  Pain Score 0  Take Vital Signs  Increase Vital Sign Frequency  Yellow: Q 2hr X 2 then Q 4hr X 2, if remains yellow, continue Q 4hrs  Escalate  MEWS: Escalate Yellow: discuss with charge nurse/RN and consider discussing with provider and RRT  Notify: Charge Nurse/RN  Name of Charge Nurse/RN Notified Pam, RN  Date Charge Nurse/RN Notified 02/11/20  Time Charge Nurse/RN Notified [phone]

## 2020-02-11 NOTE — ED Notes (Signed)
Messaged pharmacy regarding patient's medications needing to be verified.

## 2020-02-11 NOTE — ED Notes (Signed)
ED TO INPATIENT HANDOFF REPORT  ED Nurse Name and Phone #: Clarise Cruz [phone]  S Name/Age/Gender [name] 85 y.o. male Room/Bed: WA04/WA04  Code Status   Code Status: Full Code  Home/SNF/Other Home Patient oriented to: self, place, time and situation Is this baseline? Yes   Triage Complete: Triage complete  Chief Complaint CAP (community acquired pneumonia) [J18.9]  Triage Note Family states that chills, no appetite, fevers started Wed and fevers not relieved with Tylenol every 4 hours, last dose 1pm. No problems with n/v/d or urinary.     Allergies Allergies  Allergen Reactions  . Ciprofloxacin Anaphylaxis and Nausea And Vomiting    High dose  . Nsaids Other (See Comments)    Stomach upset, burning    Level of Care/Admitting Diagnosis ED Disposition    ED Disposition Condition Comment   Admit  Hospital Area: [place] [[id number]]  Level of Care: Telemetry [5]  Admit to tele based on following criteria: Monitor for Ischemic changes  May admit patient to [facility] or [facility] if equivalent level of care is available:: No  Covid Evaluation: Confirmed COVID Negative  Diagnosis: CAP (community acquired pneumonia) [170017]  Admitting Physician: Rise Patience [phone]  Attending Physician: Rise Patience [phone]  Estimated length of stay: past midnight tomorrow  Certification:: I certify this patient will need inpatient services for at least 2 midnights       B Medical/Surgery History Past Medical History:  Diagnosis Date  . CVA (cerebral infarction)    Significant carotid artery disease status post right carotid endarterectomy, postoperatively  . Depression   . GERD (gastroesophageal reflux disease)   . Heart murmur   . Hemorrhoids   . Hernia   . History of BPH   . Hyperlipemia   . Hypertension   . Hypothyroidism   . Mitral regurgitation     status post mitral valve repair at [facility] in [place]  .  Status post coronary artery bypass grafting    Single-vessel coronary bypass grafting at time of mitral valve repair  . Stroke [facility]) Oct 2010  . Thyroid disease    hypothyroidism   Past Surgical History:  Procedure Laterality Date  . CAROTID ENDARTERECTOMY Right Nov. 2010   CEA  . CATARACT EXTRACTION    . CHOLECYSTECTOMY    . COLONOSCOPY N/A 05/30/2013   Procedure: COLONOSCOPY;  Surgeon: Rogene Houston, MD;  Location: [facility];  Service: Endoscopy;  Laterality: N/A;  1230-rescheduled to 12:00 Ann notified pt  . CORONARY ARTERY BYPASS GRAFT    . ERCP     with biliary and pancreatic stenting  . INGUINAL HERNIA REPAIR  01/19/2011   Bilateral  . MITRAL VALVE REPAIR  April 2010  . MITRAL VALVE REPAIR  2010  . nevus removed    . TURP VAPORIZATION    . WART FULGURATION  01/03/2012   Procedure: FULGURATION ANAL WART;  Surgeon: Zenovia Jarred, MD;  Location: [place];  Service: General;  Laterality: N/A;  excision perianal condylomata     A IV Location/Drains/Wounds Patient Lines/Drains/Airways Status    Active Line/Drains/Airways    Name Placement date Placement time Site Days   Peripheral IV 02/10/20 Left Antecubital 02/10/20  2242  Antecubital  1   Incision 01/03/12 Scrotum Other (Comment) 01/03/12  0753   2961          Intake/Output Last 24 hours  Intake/Output Summary (Last 24 hours) at 02/11/2020 0301  Last data filed at 02/11/2020 0130 Gross per 24 hour  Intake 249.07 ml  Output --  Net 249.07 ml    Labs/Imaging Results for orders placed or performed during the hospital encounter of 02/10/20 (from the past 48 hour(s))  Urinalysis, Routine w reflex microscopic Urine, Clean Catch     Status: Abnormal   Collection Time: 02/10/20  5:35 PM  Result Value Ref Range   Color, Urine YELLOW YELLOW   APPearance CLEAR CLEAR   Specific Gravity, Urine 1.025 1.005 - 1.030   pH 5.0 5.0 - 8.0   Glucose, UA NEGATIVE NEGATIVE mg/dL   Hgb urine dipstick  MODERATE (A) NEGATIVE   Bilirubin Urine NEGATIVE NEGATIVE   Ketones, ur 5 (A) NEGATIVE mg/dL   Protein, ur 100 (A) NEGATIVE mg/dL   Nitrite NEGATIVE NEGATIVE   Leukocytes,Ua NEGATIVE NEGATIVE   RBC / HPF 6-10 0 - 5 RBC/hpf   WBC, UA 0-5 0 - 5 WBC/hpf   Bacteria, UA NONE SEEN NONE SEEN   Squamous Epithelial / LPF 0-5 0 - 5   Mucus PRESENT     Comment: Performed at [facility], [place] [address], [place], [place] [address]  Lipase, blood     Status: None   Collection Time: 02/10/20  6:31 PM  Result Value Ref Range   Lipase 26 11 - 51 U/L    Comment: Performed at [facility], [place] [address] [place], [place] [address]  Comprehensive metabolic panel     Status: Abnormal   Collection Time: 02/10/20  6:31 PM  Result Value Ref Range   Sodium 130 (L) 135 - 145 mmol/L   Potassium 4.1 3.5 - 5.1 mmol/L   Chloride 98 98 - 111 mmol/L   CO2 21 (L) 22 - 32 mmol/L   Glucose, Bld 156 (H) 70 - 99 mg/dL    Comment: Glucose reference range applies only to samples taken after fasting for at least 8 hours.   BUN 29 (H) 8 - 23 mg/dL   Creatinine, Ser 1.82 (H) 0.61 - 1.24 mg/dL   Calcium 8.7 (L) 8.9 - 10.3 mg/dL   Total Protein 7.9 6.5 - 8.1 g/dL   Albumin 3.7 3.5 - 5.0 g/dL   AST 43 (H) 15 - 41 U/L   ALT 27 0 - 44 U/L   Alkaline Phosphatase 74 38 - 126 U/L   Total Bilirubin 0.8 0.3 - 1.2 mg/dL   GFR calc non Af Amer 34 (L) >60 mL/min   GFR calc Af Amer 39 (L) >60 mL/min   Anion gap 11 5 - 15    Comment: Performed at [facility], [place] [address], [place], [place] [address]  CBC     Status: Abnormal   Collection Time: 02/10/20  6:31 PM  Result Value Ref Range   WBC 12.4 (H) 4.0 - 10.5 K/uL   RBC 5.06 4.22 - 5.81 MIL/uL   Hemoglobin 14.0 13.0 - 17.0 g/dL   HCT 41.5 39 - 52 %   MCV 82.0 80.0 - 100.0 fL   MCH 27.7 26.0 - 34.0 pg   MCHC 33.7 30.0 - 36.0 g/dL   RDW 13.2 11.5 - 15.5 %   Platelets 182 150 - 400 K/uL   nRBC 0.0 0.0 - 0.2 %     Comment: Performed at [facility], [place] [address], [place], [place] [address]  SARS Coronavirus 2 by RT PCR (hospital order, performed in [facility] hospital lab) Nasopharyngeal Nasopharyngeal  Swab     Status: None   Collection Time: 02/10/20 10:10 PM   Specimen: Nasopharyngeal Swab  Result Value Ref Range   SARS Coronavirus 2 NEGATIVE NEGATIVE    Comment: (NOTE) SARS-CoV-2 target nucleic acids are NOT DETECTED.  The SARS-CoV-2 RNA is generally detectable in upper and lower respiratory specimens during the acute phase of infection. The lowest concentration of SARS-CoV-2 viral copies this assay can detect is 250 copies / mL. A negative result does not preclude SARS-CoV-2 infection and should not be used as the sole basis for treatment or other patient management decisions.  A negative result may occur with improper specimen collection / handling, submission of specimen other than nasopharyngeal swab, presence of viral mutation(s) within the areas targeted by this assay, and inadequate number of viral copies (<250 copies / mL). A negative result must be combined with clinical observations, patient history, and epidemiological information.  Fact Sheet for Patients:   StrictlyIdeas.no  Fact Sheet for Healthcare Providers: BankingDealers.co.za  This test is not yet approved or  cleared by the [place] FDA and has been authorized for detection and/or diagnosis of SARS-CoV-2 by FDA under an Emergency Use Authorization (EUA).  This EUA will remain in effect (meaning this test can be used) for the duration of the COVID-19 declaration under Section 564(b)(1) of the Act, 21 U.S.C. section 360bbb-3(b)(1), unless the authorization is terminated or revoked sooner.  Performed at [facility], [place] [address], [place], [place] [address]   Lactic acid, plasma     Status: None   Collection Time: 02/10/20  10:10 PM  Result Value Ref Range   Lactic Acid, Venous 1.1 0.5 - 1.9 mmol/L    Comment: Performed at [facility], [place] [address] [place], [place] [address]  Troponin I (High Sensitivity)     Status: None   Collection Time: 02/10/20 10:10 PM  Result Value Ref Range   Troponin I (High Sensitivity) 17 <18 ng/L    Comment: (NOTE) Elevated high sensitivity troponin I (hsTnI) values and significant  changes across serial measurements may suggest ACS but many other  chronic and acute conditions are known to elevate hsTnI results.  Refer to the "Links" section for chest pain algorithms and additional  guidance. Performed at [facility], [place] [address], [place], [place] [address]    DG Chest 2 View  Result Date: 02/10/2020 CLINICAL DATA:  Fever. EXAM: CHEST - 2 VIEW COMPARISON:  January 06, 2011 FINDINGS: The lungs are hyperinflated. Multiple sternal wires and vascular clips are noted. Mild slightly patchy infiltrate is seen within the right lung base. There is no evidence of a pleural effusion or pneumothorax. The heart size and mediastinal contours are within normal limits. An artificial cardiac valve is seen. The visualized skeletal structures are unremarkable. Radiopaque surgical clips are seen within the right upper quadrant. IMPRESSION: 1. Evidence of prior median sternotomy/CABG. 2. Mild right basilar infiltrate. Electronically Signed   By: Virgina Norfolk M.D.   On: 02/10/2020 20:53    Pending Labs Unresulted Labs (From admission, onward)          Start     Ordered   02/17/20 0500  Creatinine, serum  (enoxaparin (LOVENOX)    CrCl >/= 30 ml/min)  Weekly,   R     Comments: while on enoxaparin therapy    02/11/20 0000   02/11/20 4332  Basic metabolic panel  Tomorrow morning,   R  02/11/20 0000   02/11/20 0500  CBC  Tomorrow morning,   R        02/11/20 0000   02/11/20 0000  Legionella Pneumophila Serogp 1 Ur Ag  Once,   STAT         02/11/20 0000   02/11/20 0000  Strep pneumoniae urinary antigen  Once,   STAT        02/11/20 0000   02/11/20 0000  Culture, sputum-assessment  Once,   R        02/11/20 0000   02/10/20 2058  Culture, blood (routine x 2)  BLOOD CULTURE X 2,   STAT      02/10/20 2057   02/10/20 1818  Differential  Once,   STAT        02/10/20 1817          Vitals/Pain Today's Vitals   02/11/20 0100 02/11/20 0130 02/11/20 0200 02/11/20 0230  BP: 122/69 121/77 115/72 127/83  Pulse: 81 83 84 87  Resp: (!) 22 (!) 22 (!) 26 (!) 24  Temp:      TempSrc:      SpO2: 94% 92% 92% 94%  Weight:      PainSc:        Isolation Precautions No active isolations  Medications Medications  aspirin EC tablet 81 mg (has no administration in time range)  amLODipine (NORVASC) tablet 10 mg (has no administration in time range)  carvedilol (COREG) tablet 12.5 mg (has no administration in time range)  hydrALAZINE (APRESOLINE) tablet 25 mg (has no administration in time range)  rosuvastatin (CRESTOR) tablet 2.5 mg (has no administration in time range)  levothyroxine (SYNTHROID) tablet 50 mcg (has no administration in time range)  levothyroxine (SYNTHROID) tablet 75 mcg (has no administration in time range)  pantoprazole (PROTONIX) EC tablet 40 mg (has no administration in time range)  tamsulosin (FLOMAX) capsule 0.4 mg (has no administration in time range)  vitamin B-12 (CYANOCOBALAMIN) tablet 500 mcg (has no administration in time range)  latanoprost (XALATAN) 0.005 % ophthalmic solution 1 drop (has no administration in time range)  ondansetron (ZOFRAN) tablet 4 mg (has no administration in time range)    Or  ondansetron (ZOFRAN) injection 4 mg (has no administration in time range)  enoxaparin (LOVENOX) injection 40 mg (has no administration in time range)  0.9 %  sodium chloride infusion ( Intravenous New Bag/Given 02/11/20 0029)  acetaminophen (TYLENOL) tablet 650 mg (has no administration in time range)    Or   acetaminophen (TYLENOL) suppository 650 mg (has no administration in time range)  cefTRIAXone (ROCEPHIN) 2 g in sodium chloride 0.9 % 100 mL IVPB (has no administration in time range)  azithromycin (ZITHROMAX) 500 mg in sodium chloride 0.9 % 250 mL IVPB (has no administration in time range)  sodium chloride 0.9 % bolus 500 mL (0 mLs Intravenous Stopped 02/11/20 0023)  cefTRIAXone (ROCEPHIN) 1 g in sodium chloride 0.9 % 100 mL IVPB (0 g Intravenous Stopped 02/11/20 0022)  azithromycin (ZITHROMAX) 500 mg in sodium chloride 0.9 % 250 mL IVPB ( Intravenous Stopped 02/11/20 0129)  acetaminophen (TYLENOL) tablet 1,000 mg (1,000 mg Oral Given 02/10/20 2203)    Mobility walks with person assist Low fall risk   Focused Assessments    R Recommendations: See Admitting Provider Note  Report given to:   Additional Notes:

## 2020-02-12 LAB — BASIC METABOLIC PANEL
Anion gap: 11 (ref 5–15)
BUN: 22 mg/dL (ref 8–23)
CO2: 19 mmol/L — ABNORMAL LOW (ref 22–32)
Calcium: 7.8 mg/dL — ABNORMAL LOW (ref 8.9–10.3)
Chloride: 102 mmol/L (ref 98–111)
Creatinine, Ser: 1.41 mg/dL — ABNORMAL HIGH (ref 0.61–1.24)
GFR calc Af Amer: 53 mL/min — ABNORMAL LOW (ref >60)
GFR calc non Af Amer: 46 mL/min — ABNORMAL LOW (ref >60)
Glucose, Bld: 104 mg/dL — ABNORMAL HIGH (ref 70–99)
Potassium: 3.1 mmol/L — ABNORMAL LOW (ref 3.5–5.1)
Sodium: 132 mmol/L — ABNORMAL LOW (ref 135–145)

## 2020-02-12 LAB — LEGIONELLA PNEUMOPHILA SEROGP 1 UR AG: L. pneumophila Serogp 1 Ur Ag: NEGATIVE

## 2020-02-12 LAB — PROCALCITONIN: Procalcitonin: 0.48 ng/mL

## 2020-02-12 MED ORDER — POTASSIUM CHLORIDE 20 MEQ PO PACK
40.0000 meq | PACK | Freq: Once | ORAL | Status: AC
  Administered 2020-02-12: 40 meq via ORAL
  Filled 2020-02-12: qty 2

## 2020-02-12 MED ORDER — ENOXAPARIN SODIUM 40 MG/0.4ML ~~LOC~~ SOLN
40.0000 mg | SUBCUTANEOUS | Status: DC
  Administered 2020-02-13 – 2020-02-14 (×2): 40 mg via SUBCUTANEOUS
  Filled 2020-02-12 (×2): qty 0.4

## 2020-02-12 MED ORDER — CARVEDILOL 6.25 MG PO TABS
6.2500 mg | ORAL_TABLET | Freq: Two times a day (BID) | ORAL | Status: DC
  Administered 2020-02-12 – 2020-02-14 (×4): 6.25 mg via ORAL
  Filled 2020-02-12 (×3): qty 1

## 2020-02-12 NOTE — Progress Notes (Addendum)
PROGRESS NOTE    Henry MARKEN  WGN:[id number]  DOB: 01-Nov-1934  PCP: Glenda Chroman, MD Admit date:02/10/2020 Chief compliant:  85 y.o. male with history of CAD status post CABG and mitral valve repair in 2010 being followed by cardiologist Dr. Einar Gip , chronic kidney disease baseline creatinine around 1.6 . Per patient's son, patient been experiencing fever with Tmax 104/ chills with weakness for the last 4 days with nonproductive cough ? chronic.  Given the persistent symptoms patient was advised to come to the ER. ED Course: Febrile , Tmax 101.9. WBC 2.4 with CXR showing right-sided infiltrates.  Blood cultures were drawn and started on empiric antibiotics for community-acquired pneumonia. SARS CoV PCR -ve. Hospital course: Patient admitted to [facility] for CAP   Subjective:  Patient out of bed to chair, c/o fatigue and generalized weakness. Also reports feeling dizzy on walking to the bathroom. Tmax 101.8 F yesterday around 4 pm Objective: Vitals:   02/11/20 2039 02/12/20 0452 02/12/20 0844 02/12/20 1300  BP: 126/63 129/63    Pulse: 74 71 82   Resp: 18 18    Temp: 98.3 F (36.8 C) 99.4 F (37.4 C)  99 F (37.2 C)  TempSrc: Oral Oral  Oral  SpO2: 94% 95%  95%  Weight:      Height:        Intake/Output Summary (Last 24 hours) at 02/12/2020 1448 Last data filed at 02/12/2020 0900 Gross per 24 hour  Intake 2653.46 ml  Output --  Net 2653.46 ml   Filed Weights   02/11/20 0017 02/11/20 0413  Weight: 65.8 kg 60.9 kg    Physical Examination:  General: Moderately built, no acute distress noted Head ENT: Atraumatic normocephalic, PERRLA, neck supple Heart: S1-S2 heard, regular rate and rhythm, no murmurs.  No leg edema noted Lungs: Equal air entry bilaterally, no rhonchi or rales on exam, no accessory muscle use Abdomen: Bowel sounds heard, soft, nontender, nondistended. No organomegaly.  No CVA tenderness Extremities: No pedal edema.  No cyanosis or  clubbing. Neurological: Awake alert oriented x3, no focal weakness or numbness, strength and sensations to crude touch intact Skin: No wounds or rashes.  Data Reviewed: I have personally reviewed following labs and imaging studies  CBC: Recent Labs  Lab 02/10/20 1831 02/11/20 0838  WBC 12.4* 10.6*  HGB 14.0 12.3*  HCT 41.5 36.8*  MCV 82.0 82.1  PLT 182 784*   Basic Metabolic Panel: Recent Labs  Lab 02/10/20 1831 02/11/20 0838 02/12/20 0557  NA 130* 132* 132*  K 4.1 3.5 3.1*  CL 98 101 102  CO2 21* 21* 19*  GLUCOSE 156* 110* 104*  BUN 29* 25* 22  CREATININE 1.82* 1.58* 1.41*  CALCIUM 8.7* 8.1* 7.8*   GFR: Estimated Creatinine Clearance: 34.8 mL/min (A) (by C-G formula based on SCr of 1.41 mg/dL (H)). Liver Function Tests: Recent Labs  Lab 02/10/20 1831  AST 43*  ALT 27  ALKPHOS 74  BILITOT 0.8  PROT 7.9  ALBUMIN 3.7   Recent Labs  Lab 02/10/20 1831  LIPASE 26   No results for input(s): AMMONIA in the last 168 hours. Coagulation Profile: No results for input(s): INR, PROTIME in the last 168 hours. Cardiac Enzymes: No results for input(s): CKTOTAL, CKMB, CKMBINDEX, TROPONINI in the last 168 hours. BNP (last 3 results) No results for input(s): PROBNP in the last 8760 hours. HbA1C: No results for input(s): HGBA1C in the last 72 hours. CBG: No results for input(s): GLUCAP in the last 168  hours. Lipid Profile: No results for input(s): CHOL, HDL, LDLCALC, TRIG, CHOLHDL, LDLDIRECT in the last 72 hours. Thyroid Function Tests: No results for input(s): TSH, T4TOTAL, FREET4, T3FREE, THYROIDAB in the last 72 hours. Anemia Panel: No results for input(s): VITAMINB12, FOLATE, FERRITIN, TIBC, IRON, RETICCTPCT in the last 72 hours. Sepsis Labs: Recent Labs  Lab 02/10/20 2210 02/12/20 0557  PROCALCITON  --  0.48  LATICACIDVEN 1.1  --     Recent Results (from the past 240 hour(s))  SARS Coronavirus 2 by RT PCR (hospital order, performed in [facility] hospital  lab) Nasopharyngeal Nasopharyngeal Swab     Status: None   Collection Time: 02/10/20 10:10 PM   Specimen: Nasopharyngeal Swab  Result Value Ref Range Status   SARS Coronavirus 2 NEGATIVE NEGATIVE Final    Comment: (NOTE) SARS-CoV-2 target nucleic acids are NOT DETECTED.  The SARS-CoV-2 RNA is generally detectable in upper and lower respiratory specimens during the acute phase of infection. The lowest concentration of SARS-CoV-2 viral copies this assay can detect is 250 copies / mL. A negative result does not preclude SARS-CoV-2 infection and should not be used as the sole basis for treatment or other patient management decisions.  A negative result may occur with improper specimen collection / handling, submission of specimen other than nasopharyngeal swab, presence of viral mutation(s) within the areas targeted by this assay, and inadequate number of viral copies (<250 copies / mL). A negative result must be combined with clinical observations, patient history, and epidemiological information.  Fact Sheet for Patients:   StrictlyIdeas.no  Fact Sheet for Healthcare Providers: BankingDealers.co.za  This test is not yet approved or  cleared by the [place] FDA and has been authorized for detection and/or diagnosis of SARS-CoV-2 by FDA under an Emergency Use Authorization (EUA).  This EUA will remain in effect (meaning this test can be used) for the duration of the COVID-19 declaration under Section 564(b)(1) of the Act, 21 U.S.C. section 360bbb-3(b)(1), unless the authorization is terminated or revoked sooner.  Performed at [facility], [place] [address], [place], [place] [address]   Culture, blood (routine x 2)     Status: None (Preliminary result)   Collection Time: 02/10/20 10:10 PM   Specimen: BLOOD  Result Value Ref Range Status   Specimen Description   Final    BLOOD LEFT ANTECUBITAL Performed at [place] [address] [place], [place] [address]    Special Requests   Final    BOTTLES DRAWN AEROBIC AND ANAEROBIC Blood Culture adequate volume Performed at [place] [address] [place], [place] [address]    Culture   Final    NO GROWTH 1 DAY Performed at [facility], [place] [address], [place], [place] [address]    Report Status PENDING  Incomplete      Radiology Studies: DG Chest 2 View  Result Date: 02/10/2020 CLINICAL DATA:  Fever. EXAM: CHEST - 2 VIEW COMPARISON:  January 06, 2011 FINDINGS: The lungs are hyperinflated. Multiple sternal wires and vascular clips are noted. Mild slightly patchy infiltrate is seen within the right lung base. There is no evidence of a pleural effusion or pneumothorax. The heart size and mediastinal contours are within normal limits. An artificial cardiac valve is seen. The visualized skeletal structures are unremarkable. Radiopaque surgical clips are seen within the right upper quadrant. IMPRESSION: 1. Evidence of prior median sternotomy/CABG. 2. Mild right basilar infiltrate. Electronically Signed   By: Hoover Browns  Houston M.D.   On: 02/10/2020 20:53      Scheduled Meds: . amLODipine  10 mg Oral Daily  . aspirin EC  81 mg Oral Daily  . carvedilol  12.5 mg Oral BID WC  . enoxaparin (LOVENOX) injection  30 mg Subcutaneous Q24H  . feeding supplement (ENSURE ENLIVE)  237 mL Oral BID BM  . guaiFENesin  600 mg Oral BID  . hydrALAZINE  25 mg Oral TID  . latanoprost  1 drop Both Eyes QHS  . levothyroxine  50 mcg Oral QODAY  . levothyroxine  75 mcg Oral QODAY  . multivitamin with minerals  1 tablet Oral Daily  . pantoprazole  40 mg Oral Daily  . rosuvastatin  2.5 mg Oral Daily  . tamsulosin  0.4 mg Oral QPC supper  . vitamin B-12  500 mcg Oral Daily   Continuous Infusions: . azithromycin 500 mg (02/12/20 1038)  . cefTRIAXone (ROCEPHIN)  IV 2 g (02/12/20 1159)       Assessment/Plan:  1. Community-acquired pneumonia -Continue ceftriaxone and Zithromax.    Labs including blood cx and urine for Legionella pending, negative urine strep antigen, follow final results on blood cultures continue with gentle hydration. Continue albuterol and guaifenesin for symptomatic relief.  Tessalon Perles as needed. Procalcitonin 0.48 2. AKI: Baseline creatinine appears to be 0.8 to 1.0.  Creatinine elevated at 1.8 on presentation and improving to 1.5->1.4 today with IV hydration.  Hyzaar on hold. 3. Hypertension : Check orthostatics as c/o dizziness, continue with beta-blocker and possibly hold norvasc in addition to Hyzaar if orthostatic.  As needed IV hydralazine.  Noted that patient's SBP fluctuating between 10 1-1 50s.  4. CAD status post CABG and mitral valve repair appears compensated and also denies any chest pain.  Troponins were negative.  We will continue with aspirin statins and beta-blockers. 5. Hypothyroidism on levothyroxine. 6. Hyponatremia: Likely secondary to dehydration and chronic diuretic use.  Improving with IV hydration.  Hyzaar held. Labs in am 7. History of sleep apnea. Stable 8. BPH: On Tamsulosin 9. Weakness/dizziness: check orthostatics, PT eval . Treat underlying infection.  Addendum: Patient with orthostatic change on BP checks  Supine 113/60, sitting 111/72 pulse 66, standing 94/50 pulse 75. D/C norvasc , ordered ted hose. Also on Flomax but at bedtime, can continue.    DVT prophylaxis: Lovenox Code Status: Full code Family / Patient Communication: Discussed with patient and wife at bedside. Disposition Plan:   Status is: Inpatient  Remains inpatient appropriate because:IV treatments appropriate due to intensity of illness or inability to take PO   Dispo: The patient is from: Home              Anticipated d/c is to: Home with Home PT              Anticipated d/c date is: 1 day if AKI /hyponatremia resolves and remains afebrile.               Patient currently is not medically stable to d/c.            Time spent: 25 mins     >50% time spent in discussions with care team and coordination of care.    Guilford Shi, MD [facility] Pager in [place]  If 7PM-7AM, please contact night-coverage [email or web address] 02/12/2020, 2:48 PM

## 2020-02-12 NOTE — Evaluation (Signed)
Physical Therapy Evaluation Patient Details Name: [name] MRN: [id number] DOB: 1934-07-19 Today's Date: 02/12/2020   History of Present Illness  85 y.o. male with history of CVA, CAD, status post CABG and mitral valve repair in 2010 being followed by cardiologist Dr. Einar Gip with history of chronic kidney disease. admitted with CAP, Covid test negative  Clinical Impression  Pt admitted with above diagnosis.  Pt is quite independent at his baseline. Son present today for session. Pt was very willing to mobilize however orthostasis limiting activity. Pt wife is unable to safely provide physical assist for pt at home. Son/family check and support but are not there 24/7.  Pt should progress well once medical issues resolve and be able to d/c with HHPT/OT servicies. Will continue to follow in acute setting   Pt currently with functional limitations due to the deficits listed below (see PT Problem List). Pt will benefit from skilled PT to increase their independence and safety with mobility to allow discharge to the venue listed below.       Follow Up Recommendations Home health PT;Supervision for mobility/OOB    Equipment Recommendations  Other (comment) (TBD)    Recommendations for Other Services       Precautions / Restrictions Precautions Precautions: Fall Restrictions Weight Bearing Restrictions: No      Mobility  Bed Mobility               General bed mobility comments: NT--pt in chair  Transfers Overall transfer level: Needs assistance Equipment used: None;1 person hand held assist Transfers: Sit to/from Stand Sit to Stand: Min assist;Min guard         General transfer comment: for safety, dizzy on standing/orthostatic; perfomed pre-gait/standing exercises as tolerated. pt is very willing but feeling more weak with incr standing time  Ambulation/Gait                Stairs            Wheelchair Mobility    Modified Rankin (Stroke Patients  Only)       Balance Overall balance assessment: Needs assistance   Sitting balance-Leahy Scale: Good     Standing balance support: No upper extremity supported Standing balance-Leahy Scale: Fair Standing balance comment: close supervision d/t dizziness, UE support for dynamic tasks                             Pertinent Vitals/Pain Pain Assessment: No/denies pain    Home Living Family/patient expects to be discharged to:: Private residence Living Arrangements: Spouse/significant other Available Help at Discharge: Family Type of Home: House Home Access: Stairs to enter   [employer] of Steps: 4 Home Layout: Two level;Able to live on main level with bedroom/bathroom Home Equipment: None Additional Comments: per son pt wife is unable to (safely) physically assist pt    Prior Function Level of Independence: Independent               Hand Dominance        Extremity/Trunk Assessment   Upper Extremity Assessment Upper Extremity Assessment: Overall WFL for tasks assessed    Lower Extremity Assessment Lower Extremity Assessment: Generalized weakness       Communication   Communication: No difficulties  Cognition Arousal/Alertness: Awake/alert Behavior During Therapy: WFL for tasks assessed/performed Overall Cognitive Status: Within Functional Limits for tasks assessed  General Comments      Exercises     Assessment/Plan    PT Assessment Patient needs continued PT services  PT Problem List Decreased strength;Decreased activity tolerance;Decreased balance;Decreased knowledge of use of DME;Decreased mobility       PT Treatment Interventions DME instruction;Therapeutic exercise;Gait training;Functional mobility training;Therapeutic activities;Patient/family education;Balance training    PT Goals (Current goals can be found in the Care Plan section)  Acute Rehab PT  Goals Patient Stated Goal: feel better, return to independence PT Goal Formulation: With patient Time For Goal Achievement: 02/26/20 Potential to Achieve Goals: Good    Frequency Min 3X/week   Barriers to discharge        Co-evaluation               AM-PAC PT "6 Clicks" Mobility  Outcome Measure Help needed turning from your back to your side while in a flat bed without using bedrails?: None Help needed moving from lying on your back to sitting on the side of a flat bed without using bedrails?: None Help needed moving to and from a bed to a chair (including a wheelchair)?: A Little Help needed standing up from a chair using your arms (e.g., wheelchair or bedside chair)?: A Little Help needed to walk in hospital room?: A Lot Help needed climbing 3-5 steps with a railing? : A Lot 6 Click Score: 18    End of Session   Activity Tolerance: Treatment limited secondary to medical complications (Comment) (orthostasis) Patient left: in chair;with call bell/phone within reach;with family/visitor present   PT Visit Diagnosis: Other abnormalities of gait and mobility (R26.89)    Time: 1194-1740 PT Time Calculation (min) (ACUTE ONLY): 18 min   Charges:   PT Evaluation $PT Eval Low Complexity: 1 Low          Yuri Fana, PT  Acute Rehab Dept ([place]) [phone] Pager [phone]  02/12/2020   [facility] 02/12/2020, 1:22 PM

## 2020-02-13 DIAGNOSIS — N1831 Chronic kidney disease, stage 3a: Secondary | ICD-10-CM

## 2020-02-13 LAB — BASIC METABOLIC PANEL
Anion gap: 11 (ref 5–15)
BUN: 18 mg/dL (ref 8–23)
CO2: 20 mmol/L — ABNORMAL LOW (ref 22–32)
Calcium: 8.3 mg/dL — ABNORMAL LOW (ref 8.9–10.3)
Chloride: 105 mmol/L (ref 98–111)
Creatinine, Ser: 1.33 mg/dL — ABNORMAL HIGH (ref 0.61–1.24)
GFR calc Af Amer: 57 mL/min — ABNORMAL LOW (ref >60)
GFR calc non Af Amer: 49 mL/min — ABNORMAL LOW (ref >60)
Glucose, Bld: 109 mg/dL — ABNORMAL HIGH (ref 70–99)
Potassium: 3.4 mmol/L — ABNORMAL LOW (ref 3.5–5.1)
Sodium: 136 mmol/L (ref 135–145)

## 2020-02-13 LAB — CBC
HCT: 32.6 % — ABNORMAL LOW (ref 39.0–52.0)
Hemoglobin: 11 g/dL — ABNORMAL LOW (ref 13.0–17.0)
MCH: 27.5 pg (ref 26.0–34.0)
MCHC: 33.7 g/dL (ref 30.0–36.0)
MCV: 81.5 fL (ref 80.0–100.0)
Platelets: 190 10*3/uL (ref 150–400)
RBC: 4 MIL/uL — ABNORMAL LOW (ref 4.22–5.81)
RDW: 13.3 % (ref 11.5–15.5)
WBC: 6.9 10*3/uL (ref 4.0–10.5)
nRBC: 0 % (ref 0.0–0.2)

## 2020-02-13 LAB — PROCALCITONIN: Procalcitonin: 0.26 ng/mL

## 2020-02-13 MED ORDER — AZITHROMYCIN 250 MG PO TABS
500.0000 mg | ORAL_TABLET | Freq: Every day | ORAL | Status: DC
  Administered 2020-02-14: 500 mg via ORAL
  Filled 2020-02-13: qty 2

## 2020-02-13 NOTE — TOC Initial Note (Signed)
Transition of Care [facility]) - Initial/Assessment Note    Patient Details  Name: [name] MRN: [id number] Date of Birth: 1934-06-18  [facility]) CM/SW Contact:    Dessa Phi, RN Phone Number: 02/13/2020, 12:41 PM  Clinical Narrative:PT recc HHPT/OT-chose [facility]. Will need face to face order.                  Expected Discharge Plan: [place] Barriers to Discharge: Continued Medical Work up   Patient Goals and CMS Choice Patient states their goals for this hospitalization and ongoing recovery are:: go home CMS Medicare.gov Compare Post Acute Care list provided to:: Patient Represenative (must comment) (Son [place]) Choice offered to / list presented to : Adult Children  Expected Discharge Plan and Services Expected Discharge Plan: [place]   Discharge Planning Services: CM Consult Post Acute Care Choice: [place] arrangements for the past 2 months: [place]: PT, OT HH Agency: [place] Date [place]: 02/13/20 Time [place]: 1239 Representative spoke with at [place]: [place] Arrangements/Services Living arrangements for the past 2 months: [place] Lives with:: Adult Children Patient language and need for interpreter reviewed:: Yes Do you feel safe going back to the place where you live?: Yes      Need for Family Participation in Patient Care: No (Comment) Care giver support system in place?: Yes (comment)   Criminal Activity/Legal Involvement Pertinent to Current Situation/Hospitalization: No - Comment as needed  Activities of Daily Living Home Assistive Devices/Equipment: Eyeglasses ADL Screening (condition at time of admission) Patient's cognitive ability adequate to safely complete daily activities?: No Is the patient deaf or have difficulty hearing?: No Does the patient have difficulty  seeing, even when wearing glasses/contacts?: No Does the patient have difficulty concentrating, remembering, or making decisions?: Yes Patient able to express need for assistance with ADLs?: No Does the patient have difficulty dressing or bathing?: Yes Independently performs ADLs?: No Does the patient have difficulty walking or climbing stairs?: Yes Weakness of Legs: Both Weakness of Arms/Hands: None  Permission Sought/Granted Permission sought to share information with : Case Manager Permission granted to share information with : Yes, Verbal Permission Granted  Share Information with NAME: Case Manager  Permission granted to share info w AGENCY: [facility]  Permission granted to share info w Relationship: son Naitik c [phone]     Emotional Assessment Appearance:: Appears stated age Attitude/Demeanor/Rapport: Gracious Affect (typically observed): Accepting   Alcohol / Substance Use: Not Applicable Psych Involvement: No (comment)  Admission diagnosis:  CAP (community acquired pneumonia) [J18.9] Community acquired pneumonia, unspecified laterality [J18.9] Patient Active Problem List   Diagnosis Date Noted  . CAP (community acquired pneumonia) 02/10/2020  . Essential hypertension 02/10/2020  . CKD (chronic kidney disease) stage 3, GFR 30-59 ml/min 02/10/2020  . OSA (obstructive sleep apnea) 04/21/2017  . Aftercare following surgery of the circulatory system, [place] 01/10/2014  . Condyloma acuminatum of perianal region 08/04/2011  . Mitral regurgitation   . CVA (cerebral infarction)   . Status post coronary artery bypass grafting   . Hernia   . FATIGUE 06/17/2009  . DYSPHAGIA OROPHARYNGEAL PHASE 06/17/2009  . DEPRESSION 04/16/2009  . CAROTID ARTERY DISEASE 04/16/2009   PCP:  Glenda Chroman,  MD Pharmacy:   [facility] [place]), [place] - [address] [place]) [place] [address] Phone: [phone] Fax: [place], [place]. [place]. [place] [address] Phone: [phone] Fax: [phone]  [facility] [address] [place], [place]. [place]. [place] [address] Phone: [phone] Fax: [phone]     Social Determinants of Health (SDOH) Interventions    Readmission Risk Interventions No flowsheet data found.

## 2020-02-13 NOTE — Progress Notes (Signed)
PHARMACIST - PHYSICIAN COMMUNICATION DR:   Bonner Puna CONCERNING: Antibiotic IV to Oral Route Change Policy  RECOMMENDATION: This patient is receiving azithromycin by the intravenous route.  Based on criteria approved by the Pharmacy and Therapeutics Committee, the antibiotic(s) is/are being converted to the equivalent oral dose form(s).   DESCRIPTION: These criteria include:  Patient being treated for a respiratory tract infection, urinary tract infection, cellulitis or clostridium difficile associated diarrhea if on metronidazole  The patient is not neutropenic and does not exhibit a GI malabsorption state  The patient is eating (either orally or via tube) and/or has been taking other orally administered medications for a least 24 hours  The patient is improving clinically and has a Tmax < 100.5  If you have questions about this conversion, please contact the [place], PharmD 02/13/20 1:01 PM

## 2020-02-13 NOTE — Care Management Important Message (Signed)
Important Message  Patient Details IM Letter given to the Patient Name: [name] MRN: [id number] Date of Birth: 01-Mar-1935   Medicare Important Message Given:  Yes     Kerin Salen 02/13/2020, 12:26 PM

## 2020-02-13 NOTE — Progress Notes (Signed)
PROGRESS NOTE  [name]  DVV:[id number] DOB: 1934-12-11 DOA: 02/10/2020 PCP: Glenda Chroman, MD  Outpatient Specialists: Cardiology, Dr. Einar Gip Brief Narrative: [name] is an 85 y.o. male with a history of CAD s/p CABG, MV regurgitation 2010, stage IIIa CKD who presented to the ED 8/29 with weakness, fatigue, and high fevers with chronic cough. He was febrile with leukocytosis (WBC 12.4k) and right-sided infiltrates on CXR consistent with CAP for which ceftriaxone and azithromycin were given with improvement. AKI was noted and has improved. Norvasc was stopped due to orthostatic hypotension with subsequent improvement.   Assessment & Plan: Principal Problem:   CAP (community acquired pneumonia) Active Problems:   Status post coronary artery bypass grafting   OSA (obstructive sleep apnea)   Essential hypertension   CKD (chronic kidney disease) stage 3, GFR 30-59 ml/min  RLL CAP: Continued to be febrile on 8/30. Negative strep, legionella Ag's.  - Continue ceftriaxone, azithromycin for 5-7 days.  - Monitor cultures, trend fever curve, WBC, PCT.   AKI on stage IIIa CKD:  - Holding hyzaar, continue monitoring avoiding nephrotoxins  HTN with orthostatic hypotension: Improved with Tx of infection and holding norvasc.  - Continue holding norvasc, hyzaar for now.  CAD s/p CABG, MVR: No anginal complaints or CHF symptoms. Troponins negative.  - Continue ASA, statin, BB  OSA: Stable  Hypothyroidism:  - Continue stable dose synthroid  BPH:  - Continue flomax  Hyponatremia: Resolved.  DVT prophylaxis: Lovenox Code Status: Full Family Communication: Son at bedside Disposition Plan:  Status is: Inpatient  Remains inpatient appropriate because:Inpatient level of care appropriate due to severity of illness   Dispo:  Patient From: Home  Planned Disposition: Home  Expected discharge date: 1 day  Medically stable for discharge: No  Consultants:    None  Procedures:   None  Antimicrobials:  Ceftriaxone, azithromycin   Subjective: Feels more weak today than yesterday, had chills without fever. No chest pain, but shortness of breath limited walking to nursing station (normally unrestricted ambulation without assistance) with a walker. Still some dizziness but improved. Cough is minimal, stable.   Objective: Vitals:   02/12/20 2115 02/13/20 0544 02/13/20 1224 02/13/20 1707  BP: 121/72 (!) 143/88 125/75 128/70  Pulse: 65 82 64 69  Resp: 14 14 (!) 22   Temp: 97.9 F (36.6 C) 98.6 F (37 C) 97.7 F (36.5 C)   TempSrc: Oral Oral Oral   SpO2: 95% 93% 92%   Weight:      Height:        Intake/Output Summary (Last 24 hours) at 02/13/2020 1914 Last data filed at 02/13/2020 1528 Gross per 24 hour  Intake 1300 ml  Output --  Net 1300 ml   Filed Weights   02/11/20 0017 02/11/20 0413  Weight: 65.8 kg 60.9 kg    Gen: 85 y.o. male in no distress  Pulm: Non-labored breathing, RLL crackles no wheezes.  CV: Regular rate and rhythm. No murmur, rub, or gallop. No JVD, no pedal edema. GI: Abdomen soft, non-tender, non-distended, with normoactive bowel sounds. No organomegaly or masses felt. Ext: Warm, no deformities Skin: No rashes, lesions or ulcers Neuro: Alert and oriented. No focal neurological deficits. Psych: Judgement and insight appear normal. Mood & affect appropriate.   Data Reviewed: I have personally reviewed following labs and imaging studies  CBC: Recent Labs  Lab 02/10/20 1831 02/11/20 0838 02/13/20 0750  WBC 12.4* 10.6* 6.9  HGB 14.0 12.3* 11.0*  HCT 41.5 36.8* 32.6*  MCV 82.0 82.1 81.5  PLT 182 148* 834   Basic Metabolic Panel: Recent Labs  Lab 02/10/20 1831 02/11/20 0838 02/12/20 0557 02/13/20 0750  NA 130* 132* 132* 136  K 4.1 3.5 3.1* 3.4*  CL 98 101 102 105  CO2 21* 21* 19* 20*  GLUCOSE 156* 110* 104* 109*  BUN 29* 25* 22 18  CREATININE 1.82* 1.58* 1.41* 1.33*  CALCIUM 8.7* 8.1* 7.8*  8.3*   GFR: Estimated Creatinine Clearance: 36.9 mL/min (A) (by C-G formula based on SCr of 1.33 mg/dL (H)). Liver Function Tests: Recent Labs  Lab 02/10/20 1831  AST 43*  ALT 27  ALKPHOS 74  BILITOT 0.8  PROT 7.9  ALBUMIN 3.7   Recent Labs  Lab 02/10/20 1831  LIPASE 26   No results for input(s): AMMONIA in the last 168 hours. Coagulation Profile: No results for input(s): INR, PROTIME in the last 168 hours. Cardiac Enzymes: No results for input(s): CKTOTAL, CKMB, CKMBINDEX, TROPONINI in the last 168 hours. BNP (last 3 results) No results for input(s): PROBNP in the last 8760 hours. HbA1C: No results for input(s): HGBA1C in the last 72 hours. CBG: No results for input(s): GLUCAP in the last 168 hours. Lipid Profile: No results for input(s): CHOL, HDL, LDLCALC, TRIG, CHOLHDL, LDLDIRECT in the last 72 hours. Thyroid Function Tests: No results for input(s): TSH, T4TOTAL, FREET4, T3FREE, THYROIDAB in the last 72 hours. Anemia Panel: No results for input(s): VITAMINB12, FOLATE, FERRITIN, TIBC, IRON, RETICCTPCT in the last 72 hours. Urine analysis:    Component Value Date/Time   COLORURINE YELLOW 02/10/2020 1735   APPEARANCEUR CLEAR 02/10/2020 1735   LABSPEC 1.025 02/10/2020 1735   PHURINE 5.0 02/10/2020 1735   GLUCOSEU NEGATIVE 02/10/2020 1735   GLUCOSEU NEGATIVE 11/20/2008 1247   HGBUR MODERATE (A) 02/10/2020 1735   BILIRUBINUR NEGATIVE 02/10/2020 1735   KETONESUR 5 (A) 02/10/2020 1735   PROTEINUR 100 (A) 02/10/2020 1735   UROBILINOGEN 0.2 04/21/2009 1143   NITRITE NEGATIVE 02/10/2020 1735   LEUKOCYTESUR NEGATIVE 02/10/2020 1735   Recent Results (from the past 240 hour(s))  SARS Coronavirus 2 by RT PCR (hospital order, performed in [facility] hospital lab) Nasopharyngeal Nasopharyngeal Swab     Status: None   Collection Time: 02/10/20 10:10 PM   Specimen: Nasopharyngeal Swab  Result Value Ref Range Status   SARS Coronavirus 2 NEGATIVE NEGATIVE Final    Comment:  (NOTE) SARS-CoV-2 target nucleic acids are NOT DETECTED.  The SARS-CoV-2 RNA is generally detectable in upper and lower respiratory specimens during the acute phase of infection. The lowest concentration of SARS-CoV-2 viral copies this assay can detect is 250 copies / mL. A negative result does not preclude SARS-CoV-2 infection and should not be used as the sole basis for treatment or other patient management decisions.  A negative result may occur with improper specimen collection / handling, submission of specimen other than nasopharyngeal swab, presence of viral mutation(s) within the areas targeted by this assay, and inadequate number of viral copies (<250 copies / mL). A negative result must be combined with clinical observations, patient history, and epidemiological information.  Fact Sheet for Patients:   StrictlyIdeas.no  Fact Sheet for Healthcare Providers: BankingDealers.co.za  This test is not yet approved or  cleared by the [place] FDA and has been authorized for detection and/or diagnosis of SARS-CoV-2 by FDA under an Emergency Use Authorization (EUA).  This EUA will remain in effect (meaning this test can be used) for the duration of the COVID-19  declaration under Section 564(b)(1) of the Act, 21 U.S.C. section 360bbb-3(b)(1), unless the authorization is terminated or revoked sooner.  Performed at [facility], [place] [address] [place], [place] [address]   Culture, blood (routine x 2)     Status: None (Preliminary result)   Collection Time: 02/10/20 10:10 PM   Specimen: BLOOD  Result Value Ref Range Status   Specimen Description   Final    BLOOD LEFT ANTECUBITAL Performed at [facility] [address] [place], [place] [address]    Special Requests   Final    BOTTLES DRAWN AEROBIC AND ANAEROBIC Blood Culture adequate volume Performed at [place] [facility] [address] [place], [place] [address]    Culture   Final    NO GROWTH 2 DAYS Performed at [facility] [address] [place],  [address]    Report Status PENDING  Incomplete      Radiology Studies: No results found.  Scheduled Meds: . aspirin EC  81 mg Oral Daily  . [START ON 02/14/2020] azithromycin  500 mg Oral Daily  . carvedilol  6.25 mg Oral BID WC  . enoxaparin (LOVENOX) injection  40 mg Subcutaneous Q24H  . feeding supplement (ENSURE ENLIVE)  237 mL Oral BID BM  . guaiFENesin  600 mg Oral BID  . latanoprost  1 drop Both Eyes QHS  . levothyroxine  50 mcg Oral QODAY  . levothyroxine  75 mcg Oral QODAY  . multivitamin with minerals  1 tablet Oral Daily  . pantoprazole  40 mg Oral Daily  . rosuvastatin  2.5 mg Oral Daily  . tamsulosin  0.4 mg Oral QPC supper  . vitamin B-12  500 mcg Oral Daily   Continuous Infusions: . cefTRIAXone (ROCEPHIN)  IV 2 g (02/13/20 1110)     LOS: 3 days   Time spent: 25 minutes.  Patrecia Pour, MD [facility] [email or web address] 02/13/2020, 7:14 PM

## 2020-02-13 NOTE — Progress Notes (Signed)
Physical Therapy Treatment Patient Details Name: [name] MRN: [id number] DOB: Aug 16, 1934 Today's Date: 02/13/2020    History of Present Illness 85 y.o. male with history of CVA, CAD, status post CABG and mitral valve repair in 2010 being followed by cardiologist Dr. Einar Gip with history of chronic kidney disease. admitted with CAP, Covid test negative    PT Comments    Pt reports dizziness and weakness throughout session.  Orthostatics obtained and pt felt able to ambulate.  Pt ambulated 120 feet and then requested return to bed.  May practice with RW next session if pt remains dizzy.    02/13/20 1115  Vital Signs  BP Location Right Arm  BP Method Automatic  Patient Position (if appropriate) Orthostatic Vitals  Orthostatic Lying   BP- Lying 130/61  Pulse- Lying 68  Orthostatic Sitting  BP- Sitting 122/64  Pulse- Sitting 70  Orthostatic Standing at 0 minutes  BP- Standing at 0 minutes 111/59  Pulse- Standing at 0 minutes 71  Orthostatic Standing at 3 minutes  BP- Standing at 3 minutes 118/56  Pulse- Standing at 3 minutes 72      Follow Up Recommendations  Home health PT;Supervision for mobility/OOB     Equipment Recommendations  Rolling walker with 5" wheels    Recommendations for Other Services       Precautions / Restrictions Precautions Precautions: Fall    Mobility  Bed Mobility Overal bed mobility: Modified Independent                Transfers Overall transfer level: Needs assistance Equipment used: None Transfers: Sit to/from Stand Sit to Stand: Min guard         General transfer comment: pt reports constant dizziness and weakness with all movement; obtained orthostatics  Ambulation/Gait Ambulation/Gait assistance: Min guard Gait Distance (Feet): 120 Feet Assistive device: IV Pole Gait Pattern/deviations: Step-through pattern;Decreased stride length     General Gait Details: pt pushed IV pole for a little more support, min/guard  for safety, pt reports dizziness however did not become worse; distance to tolerance; BP upon return to room 121/59 mmHg, HR 74 bpm   Stairs             Wheelchair Mobility    Modified Rankin (Stroke Patients Only)       Balance                                            Cognition Arousal/Alertness: Awake/alert Behavior During Therapy: WFL for tasks assessed/performed Overall Cognitive Status: Within Functional Limits for tasks assessed                                        Exercises      General Comments        Pertinent Vitals/Pain Pain Assessment: No/denies pain    Home Living                      Prior Function            PT Goals (current goals can now be found in the care plan section) Progress towards PT goals: Progressing toward goals    Frequency    Min 3X/week      PT Plan Current plan remains appropriate  Co-evaluation              AM-PAC PT "6 Clicks" Mobility   Outcome Measure  Help needed turning from your back to your side while in a flat bed without using bedrails?: None Help needed moving from lying on your back to sitting on the side of a flat bed without using bedrails?: None Help needed moving to and from a bed to a chair (including a wheelchair)?: A Little Help needed standing up from a chair using your arms (e.g., wheelchair or bedside chair)?: A Little Help needed to walk in hospital room?: A Lot Help needed climbing 3-5 steps with a railing? : A Lot 6 Click Score: 18    End of Session Equipment Utilized During Treatment: Gait belt Activity Tolerance: Patient tolerated treatment well Patient left: in bed;with call bell/phone within reach;with family/visitor present Nurse Communication: Mobility status PT Visit Diagnosis: Other abnormalities of gait and mobility (R26.89)     Time: 3794-3276 PT Time Calculation (min) (ACUTE ONLY): 24 min  Charges:  $Gait  Training: 8-22 mins                    Arlyce Dice, DPT Acute Rehabilitation Services Pager: [phone] Office: [phone]  [place] E 02/13/2020, 4:14 PM

## 2020-02-13 NOTE — Plan of Care (Signed)

## 2020-02-14 LAB — BASIC METABOLIC PANEL
Anion gap: 8 (ref 5–15)
BUN: 14 mg/dL (ref 8–23)
CO2: 21 mmol/L — ABNORMAL LOW (ref 22–32)
Calcium: 8.2 mg/dL — ABNORMAL LOW (ref 8.9–10.3)
Chloride: 107 mmol/L (ref 98–111)
Creatinine, Ser: 1.18 mg/dL (ref 0.61–1.24)
GFR calc Af Amer: >60 mL/min (ref >60)
GFR calc non Af Amer: 57 mL/min — ABNORMAL LOW (ref >60)
Glucose, Bld: 105 mg/dL — ABNORMAL HIGH (ref 70–99)
Potassium: 3.3 mmol/L — ABNORMAL LOW (ref 3.5–5.1)
Sodium: 136 mmol/L (ref 135–145)

## 2020-02-14 LAB — PROCALCITONIN: Procalcitonin: 0.13 ng/mL

## 2020-02-14 MED ORDER — BENZONATATE 100 MG PO CAPS: 100.0000 mg | ORAL_CAPSULE | Freq: Three times a day (TID) | ORAL | 0 refills | Status: DC | PRN

## 2020-02-14 NOTE — Progress Notes (Signed)
Pt d/c instruction given. Verbalized understanding of D/C instructions given. No changes in initial am assessment at this time. Awaiting arrival if the grand daughter to transport home. Cont with plan of care

## 2020-02-14 NOTE — Discharge Instructions (Signed)
Community-Acquired Pneumonia, Adult Pneumonia is an infection of the lungs. It causes swelling in the airways of the lungs. Mucus and fluid may also build up inside the airways. One type of pneumonia can happen while a person is in a hospital. A different type can happen when a person is not in a hospital (community-acquired pneumonia).  What are the causes?  This condition is caused by germs (viruses, bacteria, or fungi). Some types of germs can be passed from one person to another. This can happen when you breathe in droplets from the cough or sneeze of an infected person. What increases the risk? You are more likely to develop this condition if you:  Have a long-term (chronic) disease, such as: ? Chronic obstructive pulmonary disease (COPD). ? Asthma. ? Cystic fibrosis. ? Congestive heart failure. ? Diabetes. ? Kidney disease.  Have HIV.  Have sickle cell disease.  Have had your spleen removed.  Do not take good care of your teeth and mouth (poor dental hygiene).  Have a medical condition that increases the risk of breathing in droplets from your own mouth and nose.  Have a weakened body defense system (immune system).  Are a smoker.  Travel to areas where the germs that cause this illness are common.  Are around certain animals or the places they live. What are the signs or symptoms?  A dry cough.  A wet (productive) cough.  Fever.  Sweating.  Chest pain. This often happens when breathing deeply or coughing.  Fast breathing or trouble breathing.  Shortness of breath.  Shaking chills.  Feeling tired (fatigue).  Muscle aches. How is this treated? Treatment for this condition depends on many things. Most adults can be treated at home. In some cases, treatment must happen in a hospital. Treatment may include:  Medicines given by mouth or through an IV tube.  Being given extra oxygen.  Respiratory therapy. In rare cases, treatment for very bad pneumonia  may include:  Using a machine to help you breathe.  Having a procedure to remove fluid from around your lungs. Follow these instructions at home: Medicines  Take over-the-counter and prescription medicines only as told by your doctor. ? Only take cough medicine if you are losing sleep.  If you were prescribed an antibiotic medicine, take it as told by your doctor. Do not stop taking the antibiotic even if you start to feel better. General instructions   Sleep with your head and neck raised (elevated). You can do this by sleeping in a recliner or by putting a few pillows under your head.  Rest as needed. Get at least 8 hours of sleep each night.  Drink enough water to keep your pee (urine) pale yellow.  Eat a healthy diet that includes plenty of vegetables, fruits, whole grains, low-fat dairy products, and lean protein.  Do not use any products that contain nicotine or tobacco. These include cigarettes, e-cigarettes, and chewing tobacco. If you need help quitting, ask your doctor.  Keep all follow-up visits as told by your doctor. This is important. How is this prevented? A shot (vaccine) can help prevent pneumonia. Shots are often suggested for:  People older than 83 years of age.  People older than 83 years of age who: ? Are having cancer treatment. ? Have long-term (chronic) lung disease. ? Have problems with their body's defense system. You may also prevent pneumonia if you take these actions:  Get the flu (influenza) shot every year.  Go to the dentist as   often as told.  Wash your hands often. If you cannot use soap and water, use hand sanitizer. Contact a doctor if:  You have a fever.  You lose sleep because your cough medicine does not help. Get help right away if:  You are short of breath and it gets worse.  You have more chest pain.  Your sickness gets worse. This is very serious if: ? You are an older adult. ? Your body's defense system is weak.  You  cough up blood. Summary  Pneumonia is an infection of the lungs.  Most adults can be treated at home. Some will need treatment in a hospital.  Drink enough water to keep your pee pale yellow.  Get at least 8 hours of sleep each night. This information is not intended to replace advice given to you by your health care provider. Make sure you discuss any questions you have with your health care provider. Document Revised: 09/20/2018 Document Reviewed: 01/26/2018 Elsevier Patient Education  2020 [employer]vier Inc.  

## 2020-02-14 NOTE — TOC Transition Note (Signed)
Transition of Care [facility]) - CM/SW Discharge Note   Patient Details  Name: [name] MRN: [id number] Date of Birth: 02-Jan-1935  Transition [facility]) CM/SW Contact:  Shade Flood, LCSW Phone Number: 02/14/2020, 10:52 AM   Clinical Narrative:     Pt stable for dc today per MD. Plan remains for dc home with [facility] to follow for PT/OT at home. Updated [place] at [place]. No other TOC needs identified for dc.  Final next level of care: [place] Barriers to Discharge: Barriers Resolved   Patient Goals and CMS Choice Patient states their goals for this hospitalization and ongoing recovery are:: go home CMS Medicare.gov Compare Post Acute Care list provided to:: Patient Represenative (must comment) (Son Sterrett) Choice offered to / list presented to : Adult Children  Discharge Placement                       Discharge Plan and Services   Discharge Planning Services: CM Consult Post Acute Care Choice: Home Health                    HH Arranged: PT, OT [facility] Agency: [place] Date [facility] Agency Contacted: 02/13/20 Time [place]: [place] Representative spoke with at [place]: [place] Determinants of Health ([place]) Interventions     Readmission Risk Interventions No flowsheet data found.

## 2020-02-14 NOTE — Discharge Summary (Signed)
Physician Discharge Summary  [name]:[id number] DOB: [birth year] DOA: 02/10/2020  PCP: Henry Chroman, MD  Admit date: 02/10/2020 Discharge date: 02/14/2020  Admitted From: Home Disposition: Home   Recommendations for Outpatient Follow-up:  1. Follow up with PCP in 1-2 weeks  Home Health: PT, OT Equipment/Devices: None Discharge Condition: Stable CODE STATUS: Full Diet recommendation: Heart healthy  Brief/Interim Summary: [name] is an 85 y.o. male with a history of CAD s/p CABG, MV regurgitation 2010, stage IIIa CKD who presented to the ED 8/29 with weakness, fatigue, and high fevers with chronic cough. He was febrile with leukocytosis (WBC 12.4k) and right-sided infiltrates on CXR consistent with CAP for which ceftriaxone and azithromycin were given with improvement. AKI was noted and has improved. Norvasc was stopped due to orthostatic hypotension with subsequent improvement. Clinical improvement has continued and the patient is stable for discharge on 02/14/2020.  Discharge Diagnoses:  Principal Problem:   CAP (community acquired pneumonia) Active Problems:   Status post coronary artery bypass grafting   OSA (obstructive sleep apnea)   Essential hypertension   CKD (chronic kidney disease) stage 3, GFR 30-59 ml/min  RLL CAP: Continued to be febrile on 8/30. Negative strep, legionella Ag's.  - Completed ceftriaxone, azithromycin for 5 days - Afebrile, leukocytosis resolved. PCT sustaining diminution. Cultures NGTD at time of discharge.   AKI on stage IIIa CKD:  - Returning nearer baseline, ok to continue home meds and have BMP checked at follow up.  HTN with orthostatic hypotension: Improved with Tx of infection and holding norvasc.  - Ok to restart home medications now that convalescing from infection.  CAD s/p CABG, MVR: No anginal complaints or CHF symptoms. Troponins negative.  - Continue ASA, statin, BB  OSA: Stable  Hypothyroidism:  -  Continue stable dose synthroid  BPH:  - Continue flomax  Hyponatremia: Resolved.  Hypokalemia: Supplemented.  Discharge Instructions Discharge Instructions    Discharge instructions   Complete by: As directed    You were admitted for right lower lung pneumonia and have improved after completing antibiotics for treatment. You will need to follow up with your primary doctor in the next week or seek medical attention sooner if you develop worsening shortness of breath, any chest pain, or fever. You may continue to take tessalon as needed for cough. Home health PT and OT and a rolling walker have been ordered for you at home.     Allergies as of 02/14/2020      Reactions   Ciprofloxacin Anaphylaxis, Nausea And Vomiting   High dose   Nsaids Other (See Comments)   Stomach upset, burning      Medication List    TAKE these medications   amLODipine 10 MG tablet Commonly known as: NORVASC TAKE ONE TABLET BY MOUTH ONE TIME DAILY   aspirin EC 81 MG tablet Take 81 mg by mouth daily.   benzonatate 100 MG capsule Commonly known as: TESSALON Take 1 capsule (100 mg total) by mouth 3 (three) times daily as needed for cough.   bimatoprost 0.01 % Soln Commonly known as: LUMIGAN Place 1 drop into both eyes at bedtime.   carvedilol 12.5 MG tablet Commonly known as: COREG Take 1 tablet by mouth twice daily   hydrALAZINE 25 MG tablet Commonly known as: APRESOLINE Take 1 tablet (25 mg total) by mouth 3 (three) times daily.   levothyroxine 75 MCG tablet Commonly known as: SYNTHROID Take 75 mcg by mouth every other day. Alternates every other  day with 50mg    Levothroid 50 MCG tablet Generic drug: levothyroxine Take 50 mcg by mouth every other day.   losartan-hydrochlorothiazide 50-12.5 MG tablet Commonly known as: Hyzaar Take 1 tablet by mouth daily.   Myrbetriq 25 MG Tb24 tablet Generic drug: mirabegron ER Take 25 mg by mouth at bedtime.   omeprazole 20 MG capsule Commonly  known as: PRILOSEC Take 20 mg by mouth daily as needed.   rosuvastatin 5 MG tablet Commonly known as: CRESTOR Take 2.5 mg by mouth daily.   silodosin 8 MG Caps capsule Commonly known as: RAPAFLO Take 8 mg by mouth daily with breakfast.   vitamin B-12 500 MCG tablet Commonly known as: CYANOCOBALAMIN Take 500 mcg by mouth daily.            Durable Medical Equipment  (From admission, onward)         Start     Ordered   02/13/20 1914  For home use only DME Walker rolling  Once       Question Answer Comment  Walker: With 5 Inch Wheels   Patient needs a walker to treat with the following condition Gait instability      02/13/20 1914          Follow-up Information    Care, [facility] Follow up.   Specialty: Home Health Services Why: [facility] physical therapy/occupational therapy Contact information: [address] [place] [address] [phone]        Henry Chroman, MD. Schedule an appointment as soon as possible for a visit in 1 week(s).   Specialty: Internal Medicine Contact information: [place] [address] [phone]              Allergies  Allergen Reactions  . Ciprofloxacin Anaphylaxis and Nausea And Vomiting    High dose  . Nsaids Other (See Comments)    Stomach upset, burning    Consultations:  None  Procedures/Studies: DG Chest 2 View  Result Date: 02/10/2020 CLINICAL DATA:  Fever. EXAM: CHEST - 2 VIEW COMPARISON:  January 06, 2011 FINDINGS: The lungs are hyperinflated. Multiple sternal wires and vascular clips are noted. Mild slightly patchy infiltrate is seen within the right lung base. There is no evidence of a pleural effusion or pneumothorax. The heart size and mediastinal contours are within normal limits. An artificial cardiac valve is seen. The visualized skeletal structures are unremarkable. Radiopaque surgical clips are seen within the right upper quadrant. IMPRESSION: 1. Evidence of prior median  sternotomy/CABG. 2. Mild right basilar infiltrate. Electronically Signed   By: Virgina Norfolk M.D.   On: 02/10/2020 20:53       Subjective: Feels more strength, no shortness of breath currently, cough is stable. Feels ready to go home.  Discharge Exam: Vitals:   02/14/20 0414 02/14/20 1024  BP: (!) 152/72 (!) 142/68  Pulse: 70 76  Resp: 18   Temp: 98 F (36.7 C)   SpO2: 95%    General: Pt is alert, awake, not in acute distress Cardiovascular: RRR, S1/S2 +, no rubs, no gallops Respiratory: Nonlabored, crackles at right base persist. Abdominal: Soft, NT, ND, bowel sounds + Extremities: No edema, no cyanosis  Labs: BNP (last 3 results) No results for input(s): BNP in the last 8760 hours. Basic Metabolic Panel: Recent Labs  Lab 02/10/20 1831 02/11/20 0838 02/12/20 0557 02/13/20 0750 02/14/20 0547  NA 130* 132* 132* 136 136  K 4.1 3.5 3.1* 3.4* 3.3*  CL 98 101  102 105 107  CO2 21* 21* 19* 20* 21*  GLUCOSE 156* 110* 104* 109* 105*  BUN 29* 25* 22 18 14   CREATININE 1.82* 1.58* 1.41* 1.33* 1.18  CALCIUM 8.7* 8.1* 7.8* 8.3* 8.2*   Liver Function Tests: Recent Labs  Lab 02/10/20 1831  AST 43*  ALT 27  ALKPHOS 74  BILITOT 0.8  PROT 7.9  ALBUMIN 3.7   Recent Labs  Lab 02/10/20 1831  LIPASE 26   No results for input(s): AMMONIA in the last 168 hours. CBC: Recent Labs  Lab 02/10/20 1831 02/11/20 0838 02/13/20 0750  WBC 12.4* 10.6* 6.9  HGB 14.0 12.3* 11.0*  HCT 41.5 36.8* 32.6*  MCV 82.0 82.1 81.5  PLT 182 148* 190   Cardiac Enzymes: No results for input(s): CKTOTAL, CKMB, CKMBINDEX, TROPONINI in the last 168 hours. BNP: Invalid input(s): POCBNP CBG: No results for input(s): GLUCAP in the last 168 hours. D-Dimer No results for input(s): DDIMER in the last 72 hours. Hgb A1c No results for input(s): HGBA1C in the last 72 hours. Lipid Profile No results for input(s): CHOL, HDL, LDLCALC, TRIG, CHOLHDL, LDLDIRECT in the last 72 hours. Thyroid  function studies No results for input(s): TSH, T4TOTAL, T3FREE, THYROIDAB in the last 72 hours.  Invalid input(s): FREET3 Anemia work up No results for input(s): VITAMINB12, FOLATE, FERRITIN, TIBC, IRON, RETICCTPCT in the last 72 hours. Urinalysis    Component Value Date/Time   COLORURINE YELLOW 02/10/2020 1735   APPEARANCEUR CLEAR 02/10/2020 1735   LABSPEC 1.025 02/10/2020 1735   PHURINE 5.0 02/10/2020 1735   GLUCOSEU NEGATIVE 02/10/2020 1735   GLUCOSEU NEGATIVE 11/20/2008 1247   HGBUR MODERATE (A) 02/10/2020 1735   BILIRUBINUR NEGATIVE 02/10/2020 1735   KETONESUR 5 (A) 02/10/2020 1735   PROTEINUR 100 (A) 02/10/2020 1735   UROBILINOGEN 0.2 04/21/2009 1143   NITRITE NEGATIVE 02/10/2020 1735   LEUKOCYTESUR NEGATIVE 02/10/2020 1735    Microbiology Recent Results (from the past 240 hour(s))  SARS Coronavirus 2 by RT PCR (hospital order, performed in [place] hospital lab) Nasopharyngeal Nasopharyngeal Swab     Status: None   Collection Time: 02/10/20 10:10 PM   Specimen: Nasopharyngeal Swab  Result Value Ref Range Status   SARS Coronavirus 2 NEGATIVE NEGATIVE Final    Comment: (NOTE) SARS-CoV-2 target nucleic acids are NOT DETECTED.  The SARS-CoV-2 RNA is generally detectable in upper and lower respiratory specimens during the acute phase of infection. The lowest concentration of SARS-CoV-2 viral copies this assay can detect is 250 copies / mL. A negative result does not preclude SARS-CoV-2 infection and should not be used as the sole basis for treatment or other patient management decisions.  A negative result may occur with improper specimen collection / handling, submission of specimen other than nasopharyngeal swab, presence of viral mutation(s) within the areas targeted by this assay, and inadequate number of viral copies (<250 copies / mL). A negative result must be combined with clinical observations, patient history, and epidemiological information.  Fact Sheet  for Patients:   StrictlyIdeas.no  Fact Sheet for Healthcare Providers: BankingDealers.co.za  This test is not yet approved or  cleared by the [place] FDA and has been authorized for detection and/or diagnosis of SARS-CoV-2 by FDA under an Emergency Use Authorization (EUA).  This EUA will remain in effect (meaning this test can be used) for the duration of the COVID-19 declaration under Section 564(b)(1) of the Act, 21 U.S.C. section 360bbb-3(b)(1), unless the authorization is terminated or revoked sooner.  Performed  at [facility], [place] [address] [place], [place] [address]   Culture, blood (routine x 2)     Status: None (Preliminary result)   Collection Time: 02/10/20 10:10 PM   Specimen: BLOOD  Result Value Ref Range Status   Specimen Description   Final    BLOOD LEFT ANTECUBITAL Performed at [facility] [address] [place], [place] [address]    Special Requests   Final    BOTTLES DRAWN AEROBIC AND ANAEROBIC Blood Culture adequate volume Performed at [facility] [address] [place], [place] [address]    Culture   Final    NO GROWTH 3 DAYS Performed at [facility], [place] [address] [place],  [address]    Report Status PENDING  Incomplete    Time coordinating discharge: Approximately 40 minutes  Patrecia Pour, MD  [facility] 02/14/2020, 10:34 AM

## 2020-02-15 DIAGNOSIS — Z951 Presence of aortocoronary bypass graft: Secondary | ICD-10-CM | POA: Diagnosis not present

## 2020-02-15 DIAGNOSIS — E039 Hypothyroidism, unspecified: Secondary | ICD-10-CM | POA: Diagnosis not present

## 2020-02-15 DIAGNOSIS — I129 Hypertensive chronic kidney disease with stage 1 through stage 4 chronic kidney disease, or unspecified chronic kidney disease: Secondary | ICD-10-CM | POA: Diagnosis not present

## 2020-02-15 DIAGNOSIS — N400 Enlarged prostate without lower urinary tract symptoms: Secondary | ICD-10-CM | POA: Diagnosis not present

## 2020-02-15 DIAGNOSIS — J181 Lobar pneumonia, unspecified organism: Secondary | ICD-10-CM | POA: Diagnosis not present

## 2020-02-15 DIAGNOSIS — I951 Orthostatic hypotension: Secondary | ICD-10-CM | POA: Diagnosis not present

## 2020-02-15 DIAGNOSIS — Z9181 History of falling: Secondary | ICD-10-CM | POA: Diagnosis not present

## 2020-02-15 DIAGNOSIS — K219 Gastro-esophageal reflux disease without esophagitis: Secondary | ICD-10-CM | POA: Diagnosis not present

## 2020-02-15 DIAGNOSIS — N179 Acute kidney failure, unspecified: Secondary | ICD-10-CM | POA: Diagnosis not present

## 2020-02-15 DIAGNOSIS — I10 Essential (primary) hypertension: Secondary | ICD-10-CM | POA: Diagnosis not present

## 2020-02-15 DIAGNOSIS — N1831 Chronic kidney disease, stage 3a: Secondary | ICD-10-CM | POA: Diagnosis not present

## 2020-02-15 DIAGNOSIS — E785 Hyperlipidemia, unspecified: Secondary | ICD-10-CM | POA: Diagnosis not present

## 2020-02-15 DIAGNOSIS — G4733 Obstructive sleep apnea (adult) (pediatric): Secondary | ICD-10-CM | POA: Diagnosis not present

## 2020-02-15 DIAGNOSIS — F329 Major depressive disorder, single episode, unspecified: Secondary | ICD-10-CM | POA: Diagnosis not present

## 2020-02-15 DIAGNOSIS — Z7982 Long term (current) use of aspirin: Secondary | ICD-10-CM | POA: Diagnosis not present

## 2020-02-15 DIAGNOSIS — I2510 Atherosclerotic heart disease of native coronary artery without angina pectoris: Secondary | ICD-10-CM | POA: Diagnosis not present

## 2020-02-15 DIAGNOSIS — Z8673 Personal history of transient ischemic attack (TIA), and cerebral infarction without residual deficits: Secondary | ICD-10-CM | POA: Diagnosis not present

## 2020-02-16 LAB — CULTURE, BLOOD (ROUTINE X 2)
Culture: NO GROWTH
Special Requests: ADEQUATE

## 2020-02-19 DIAGNOSIS — N1831 Chronic kidney disease, stage 3a: Secondary | ICD-10-CM | POA: Diagnosis not present

## 2020-02-19 DIAGNOSIS — I2510 Atherosclerotic heart disease of native coronary artery without angina pectoris: Secondary | ICD-10-CM | POA: Diagnosis not present

## 2020-02-19 DIAGNOSIS — I129 Hypertensive chronic kidney disease with stage 1 through stage 4 chronic kidney disease, or unspecified chronic kidney disease: Secondary | ICD-10-CM | POA: Diagnosis not present

## 2020-02-19 DIAGNOSIS — N179 Acute kidney failure, unspecified: Secondary | ICD-10-CM | POA: Diagnosis not present

## 2020-02-19 DIAGNOSIS — I951 Orthostatic hypotension: Secondary | ICD-10-CM | POA: Diagnosis not present

## 2020-02-19 DIAGNOSIS — J181 Lobar pneumonia, unspecified organism: Secondary | ICD-10-CM | POA: Diagnosis not present

## 2020-02-21 DIAGNOSIS — J189 Pneumonia, unspecified organism: Secondary | ICD-10-CM | POA: Diagnosis not present

## 2020-02-21 DIAGNOSIS — R531 Weakness: Secondary | ICD-10-CM | POA: Diagnosis not present

## 2020-02-21 DIAGNOSIS — I10 Essential (primary) hypertension: Secondary | ICD-10-CM | POA: Diagnosis not present

## 2020-02-21 DIAGNOSIS — Z09 Encounter for follow-up examination after completed treatment for conditions other than malignant neoplasm: Secondary | ICD-10-CM | POA: Diagnosis not present

## 2020-02-21 DIAGNOSIS — Z299 Encounter for prophylactic measures, unspecified: Secondary | ICD-10-CM | POA: Diagnosis not present

## 2020-02-21 DIAGNOSIS — R05 Cough: Secondary | ICD-10-CM | POA: Diagnosis not present

## 2020-02-21 DIAGNOSIS — N183 Chronic kidney disease, stage 3 unspecified: Secondary | ICD-10-CM | POA: Diagnosis not present

## 2020-02-21 DIAGNOSIS — Z79899 Other long term (current) drug therapy: Secondary | ICD-10-CM | POA: Diagnosis not present

## 2020-02-21 DIAGNOSIS — Z6821 Body mass index (BMI) 21.0-21.9, adult: Secondary | ICD-10-CM | POA: Diagnosis not present

## 2020-02-22 DIAGNOSIS — I129 Hypertensive chronic kidney disease with stage 1 through stage 4 chronic kidney disease, or unspecified chronic kidney disease: Secondary | ICD-10-CM | POA: Diagnosis not present

## 2020-02-22 DIAGNOSIS — N179 Acute kidney failure, unspecified: Secondary | ICD-10-CM | POA: Diagnosis not present

## 2020-02-22 DIAGNOSIS — Z79899 Other long term (current) drug therapy: Secondary | ICD-10-CM | POA: Diagnosis not present

## 2020-02-22 DIAGNOSIS — R945 Abnormal results of liver function studies: Secondary | ICD-10-CM | POA: Diagnosis not present

## 2020-02-22 DIAGNOSIS — J181 Lobar pneumonia, unspecified organism: Secondary | ICD-10-CM | POA: Diagnosis not present

## 2020-02-22 DIAGNOSIS — K921 Melena: Secondary | ICD-10-CM | POA: Diagnosis not present

## 2020-02-22 DIAGNOSIS — I951 Orthostatic hypotension: Secondary | ICD-10-CM | POA: Diagnosis not present

## 2020-02-22 DIAGNOSIS — R5383 Other fatigue: Secondary | ICD-10-CM | POA: Diagnosis not present

## 2020-02-22 DIAGNOSIS — I2510 Atherosclerotic heart disease of native coronary artery without angina pectoris: Secondary | ICD-10-CM | POA: Diagnosis not present

## 2020-02-22 DIAGNOSIS — N1831 Chronic kidney disease, stage 3a: Secondary | ICD-10-CM | POA: Diagnosis not present

## 2020-02-25 ENCOUNTER — Other Ambulatory Visit (INDEPENDENT_AMBULATORY_CARE_PROVIDER_SITE_OTHER): Payer: Self-pay | Admitting: *Deleted

## 2020-02-25 ENCOUNTER — Other Ambulatory Visit: Payer: Self-pay

## 2020-02-25 ENCOUNTER — Ambulatory Visit (INDEPENDENT_AMBULATORY_CARE_PROVIDER_SITE_OTHER): Payer: Medicare Other | Admitting: Internal Medicine

## 2020-02-25 ENCOUNTER — Encounter (INDEPENDENT_AMBULATORY_CARE_PROVIDER_SITE_OTHER): Payer: Self-pay | Admitting: *Deleted

## 2020-02-25 ENCOUNTER — Encounter (INDEPENDENT_AMBULATORY_CARE_PROVIDER_SITE_OTHER): Payer: Self-pay | Admitting: Internal Medicine

## 2020-02-25 DIAGNOSIS — D62 Acute posthemorrhagic anemia: Secondary | ICD-10-CM

## 2020-02-25 DIAGNOSIS — K921 Melena: Secondary | ICD-10-CM

## 2020-02-25 DIAGNOSIS — D649 Anemia, unspecified: Secondary | ICD-10-CM | POA: Diagnosis not present

## 2020-02-25 DIAGNOSIS — I2510 Atherosclerotic heart disease of native coronary artery without angina pectoris: Secondary | ICD-10-CM | POA: Diagnosis not present

## 2020-02-25 MED ORDER — OMEPRAZOLE 20 MG PO CPDR
20.0000 mg | DELAYED_RELEASE_CAPSULE | Freq: Two times a day (BID) | ORAL | 2 refills | Status: DC
Start: 2020-02-25 — End: 2020-03-25

## 2020-02-25 NOTE — Progress Notes (Signed)
Reason for consultation  Melena and anemia  History of present illness  Patient is 85-year-old male who has been referred through courtesy of Dr. Vyas for GI evaluation. He is accompanied by his wife and their granddaughter. He was in usual state of health until about 2-1/2 weeks ago when he developed fever chills and anorexia.  His symptoms persisted despite taking Tylenol and he presented to [facility] ER on 02/10/2020.  On evaluation he was felt to have community-acquired pneumonia. Covid test was negative as per the test for strep pneumoniae and Legionella.  His blood cultures remain negative.  He was treated with ceftriaxone and azithromycin.  Hospitalization was also pertinent for acute kidney injury which improved.  Patient has few more doses of of antibiotic left. 4 days ago he had large loose foul-smelling tarry stool.  He was immediately begun on omeprazole in the morning and famotidine at bedtime by his son Naitik Kron who is a physician's assistant.  Subsequent stools have been normal.  He has been low-dose aspirin which was stopped last week.  His hemoglobin was noted to be 9.3 g. Hemoglobin at the time of recent hospitalization was 14 g and was down to 11 g by the time he was discharged on 02/13/2020. He feels weak.  He has been sleeping too much.  His appetite has improved but not normal.  He has lost 10 pounds since this illness started.  He denies nausea vomiting heartburn dysphagia or abdominal pain.  No history of peptic ulcer disease.  He also has been having nocturnal diaphoresis that he has to change his clothes.  He has not had any fever though.  He states he is able to walk on a flat surface without being short of breath. He did receive both doses of Covid vaccine 5 months ago. He underwent screening colonoscopy in December 2014 and was normal other than small external hemorrhoids.   Current Medications: Outpatient Encounter Medications as of 02/25/2020  Medication Sig  .  amLODipine (NORVASC) 10 MG tablet TAKE ONE TABLET BY MOUTH ONE TIME DAILY  . bimatoprost (LUMIGAN) 0.01 % SOLN Place 1 drop into both eyes at bedtime.  . carvedilol (COREG) 12.5 MG tablet Take 1 tablet by mouth twice daily  . CEFUROXIME AXETIL PO Take 500 mg by mouth in the morning and at bedtime.  . cycloSPORINE (RESTASIS) 0.05 % ophthalmic emulsion Place 1 drop into both eyes at bedtime.  . famotidine (PEPCID) 20 MG tablet Take 20 mg by mouth at bedtime.  . hydrALAZINE (APRESOLINE) 25 MG tablet Take 1 tablet (25 mg total) by mouth 3 (three) times daily.  . levothyroxine (LEVOTHROID) 50 MCG tablet Take 50 mcg by mouth every other day.   . levothyroxine (SYNTHROID) 75 MCG tablet Take 75 mcg by mouth every other day. Alternates every other day with 50mg  . losartan-hydrochlorothiazide (HYZAAR) 50-12.5 MG tablet Take 1 tablet by mouth daily.  . mirabegron ER (MYRBETRIQ) 25 MG TB24 tablet Take 25 mg by mouth at bedtime.  . omeprazole (PRILOSEC) 20 MG capsule Take 20 mg by mouth daily as needed.   . rosuvastatin (CRESTOR) 5 MG tablet Take 2.5 mg by mouth daily.   . silodosin (RAPAFLO) 8 MG CAPS capsule Take 8 mg by mouth daily with breakfast.  . vitamin B-12 (CYANOCOBALAMIN) 500 MCG tablet Take 500 mcg by mouth daily.    . [DISCONTINUED] aspirin EC 81 MG tablet Take 81 mg by mouth daily. (Patient not taking: Reported on 02/25/2020)  . [DISCONTINUED] benzonatate (  TESSALON) 100 MG capsule Take 1 capsule (100 mg total) by mouth 3 (three) times daily as needed for cough. (Patient not taking: Reported on 02/25/2020)   No facility-administered encounter medications on file as of 02/25/2020.   Past Medical History:  Diagnosis Date  . CVA (cerebral infarction)    Significant carotid artery disease status post right carotid endarterectomy, postoperatively  . Depression   . GERD (gastroesophageal reflux disease)   . Heart murmur   . Hemorrhoids   . Hernia   . History of BPH   . Hyperlipemia   .  Hypertension   . Hypothyroidism   . Mitral regurgitation     status post mitral valve repair at [facility] in [place]  . Status post coronary artery bypass grafting    Single-vessel coronary bypass grafting at time of mitral valve repair  . Stroke ([facility]) Oct 2010  . Thyroid disease    hypothyroidism   Past Surgical History:  Procedure Laterality Date  . CAROTID ENDARTERECTOMY Right Nov. 2010   CEA  . CATARACT EXTRACTION    . CHOLECYSTECTOMY    . COLONOSCOPY N/A 05/30/2013   Procedure: COLONOSCOPY;  Surgeon: Tamra Koos U Detrick Dani, MD;  [facility];  Service: Endoscopy;  Laterality: N/A;  1230-rescheduled to 12:00 Ann notified pt  . CORONARY ARTERY BYPASS GRAFT    . ERCP     with biliary and pancreatic stenting  . INGUINAL HERNIA REPAIR  01/19/2011   Bilateral  . MITRAL VALVE REPAIR  April 2010  . MITRAL VALVE REPAIR  2010  . nevus removed    . TURP VAPORIZATION    . WART FULGURATION  01/03/2012   Procedure: FULGURATION ANAL WART;  Surgeon: Burke E Thompson, MD;  Location: [facility];  Service: General;  Laterality: N/A;  excision perianal condylomata   Allergies  Allergen Reactions  . Ciprofloxacin Anaphylaxis and Nausea And Vomiting    High dose  . Nsaids Other (See Comments)    Stomach upset, burning   Family history  Father lived to be 76.  To be as he had respiratory failure. His mother died at a young age. He had 1 sister who died during infancy.  Social history  Patient is married.  He has 2 sons.  One of his sons is a PA and works at [employer].  He is retired.  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  He does not smoke cigarettes or drink alcohol.   Objective: Blood pressure (!) 95/55, pulse 78/min and irregular., temperature 98.9 F (37.2 C), temperature source Oral, height 5' 6" (1.676 m), weight 134 lb (60.8 kg). Patient is alert and in no acute distress. Conjunctiva is pink. Sclera  is nonicteric Oropharyngeal mucosa is normal. No neck masses or thyromegaly noted. Cardiac exam with irregular rhythm normal S1 and S2.  He has faint systolic murmur best heard at aortic area. Lungs are clear to auscultation. Abdomen is symmetrical.  He has bruit in epigastric region.  Bowel sounds are normal.  On palpation abdomen is soft and nontender with organomegaly or masses. No LE edema or clubbing noted.  Labs/studies Results:  CBC Latest Ref Rng & Units 02/13/2020 02/11/2020 02/10/2020  WBC 4.0 - 10.5 K/uL 6.9 10.6(H) 12.4(H)  Hemoglobin 13.0 - 17.0 g/dL 11.0(L) 12.3(L) 14.0  Hematocrit 39 - 52 % 32.6(L) 36.8(L) 41.5  Platelets 150 - 400 K/uL 190 148(L) 182    CMP Latest Ref Rng &   Units 02/14/2020 02/13/2020 02/12/2020  Glucose 70 - 99 mg/dL 105(H) 109(H) 104(H)  BUN 8 - 23 mg/dL 14 18 22  Creatinine 0.61 - 1.24 mg/dL 1.18 1.33(H) 1.41(H)  Sodium 135 - 145 mmol/L 136 136 132(L)  Potassium 3.5 - 5.1 mmol/L 3.3(L) 3.4(L) 3.1(L)  Chloride 98 - 111 mmol/L 107 105 102  CO2 22 - 32 mmol/L 21(L) 20(L) 19(L)  Calcium 8.9 - 10.3 mg/dL 8.2(L) 8.3(L) 7.8(L)  Total Protein 6.5 - 8.1 g/dL - - -  Total Bilirubin 0.3 - 1.2 mg/dL - - -  Alkaline Phos 38 - 126 U/L - - -  AST 15 - 41 U/L - - -  ALT 0 - 44 U/L - - -    Hepatic Function Latest Ref Rng & Units 02/10/2020  Total Protein 6.5 - 8.1 g/dL 7.9  Albumin 3.5 - 5.0 g/dL 3.7  AST 15 - 41 U/L 43(H)  ALT 0 - 44 U/L 27  Alk Phosphatase 38 - 126 U/L 74  Total Bilirubin 0.3 - 1.2 mg/dL 0.8    Lab data from 02/22/2020 WBC 7.7 H&H 9.3 and 28.8 MCV 84 Platelet count 430K  BUN 18 and creatinine 1.49 Glucose 189 Serum sodium 137, potassium 4.7, chloride 103, CO2 23 Serum calcium 9.1 Bilirubin 0.5, AP 105, AST 38 and ALT 81.  AST and ALT were 43 and 27 respectively 2 weeks ago.  Assessment:  #1.  Patient is 85-year-old male who presents with melena and anemia.  He has been on low-dose aspirin chronically and was discontinued last week.   Patient was begun on omeprazole 20 mg in the morning and famotidine 20 mg in the evening.  His melena has cleared. His hemoglobin has dropped significantly since recent hospitalization for pneumonia. He possibly has peptic ulcer disease resulting from pneumonia requiring hospitalization and the fact that he is on low-dose aspirin. He could also have GI angiodysplasia given history of valvular heart disease.  #2.  Mildly elevated ALT.  About 2 weeks ago his AST was mildly elevated at 43.  No history of liver disease.  LFTs will be repeated within the next couple of weeks.  #3.  Elevated blood glucose.  No history of diabetes mellitus.  Recommendations  Hemoccult x1 Discontinue famotidine. Increase omeprazole to 20 mg p.o. twice daily.  Take it 30 minutes before breakfast and evening meal daily. Esophagogastroduodenoscopy to be scheduled on 02/27/2020. He will return to the hospital for preop evaluation and Covid testing tomorrow. We will check H&H and blood glucose level at the time of endoscopy. Further recommendations to follow.      

## 2020-02-25 NOTE — H&P (View-Only) (Signed)
Reason for consultation  Melena and anemia  History of present illness  Patient is 85 year old male who has been referred through courtesy of Dr. Woody Seller for GI evaluation. He is accompanied by his wife and their granddaughter. He was in usual state of health until about 2-1/2 weeks ago when he developed fever chills and anorexia.  His symptoms persisted despite taking Tylenol and he presented to [facility] ER on 02/10/2020.  On evaluation he was felt to have community-acquired pneumonia. Covid test was negative as per the test for strep pneumoniae and Legionella.  His blood cultures remain negative.  He was treated with ceftriaxone and azithromycin.  Hospitalization was also pertinent for acute kidney injury which improved.  Patient has few more doses of of antibiotic left. 4 days ago he had large loose foul-smelling tarry stool.  He was immediately begun on omeprazole in the morning and famotidine at bedtime by his son Kain Milosevic who is a Conservation officer, historic buildings.  Subsequent stools have been normal.  He has been low-dose aspirin which was stopped last week.  His hemoglobin was noted to be 9.3 g. Hemoglobin at the time of recent hospitalization was 14 g and was down to 11 g by the time he was discharged on 02/13/2020. He feels weak.  He has been sleeping too much.  His appetite has improved but not normal.  He has lost 10 pounds since this illness started.  He denies nausea vomiting heartburn dysphagia or abdominal pain.  No history of peptic ulcer disease.  He also has been having nocturnal diaphoresis that he has to change his clothes.  He has not had any fever though.  He states he is able to walk on a flat surface without being short of breath. He did receive both doses of Covid vaccine 5 months ago. He underwent screening colonoscopy in December 2014 and was normal other than small external hemorrhoids.   Current Medications: Outpatient Encounter Medications as of 02/25/2020  Medication Sig  .  amLODipine (NORVASC) 10 MG tablet TAKE ONE TABLET BY MOUTH ONE TIME DAILY  . bimatoprost (LUMIGAN) 0.01 % SOLN Place 1 drop into both eyes at bedtime.  . carvedilol (COREG) 12.5 MG tablet Take 1 tablet by mouth twice daily  . CEFUROXIME AXETIL PO Take 500 mg by mouth in the morning and at bedtime.  . cycloSPORINE (RESTASIS) 0.05 % ophthalmic emulsion Place 1 drop into both eyes at bedtime.  . famotidine (PEPCID) 20 MG tablet Take 20 mg by mouth at bedtime.  . hydrALAZINE (APRESOLINE) 25 MG tablet Take 1 tablet (25 mg total) by mouth 3 (three) times daily.  Marland Kitchen levothyroxine (LEVOTHROID) 50 MCG tablet Take 50 mcg by mouth every other day.   . levothyroxine (SYNTHROID) 75 MCG tablet Take 75 mcg by mouth every other day. Alternates every other day with 80m  . losartan-hydrochlorothiazide (HYZAAR) 50-12.5 MG tablet Take 1 tablet by mouth daily.  . mirabegron ER (MYRBETRIQ) 25 MG TB24 tablet Take 25 mg by mouth at bedtime.  .Marland Kitchenomeprazole (PRILOSEC) 20 MG capsule Take 20 mg by mouth daily as needed.   . rosuvastatin (CRESTOR) 5 MG tablet Take 2.5 mg by mouth daily.   . silodosin (RAPAFLO) 8 MG CAPS capsule Take 8 mg by mouth daily with breakfast.  . vitamin B-12 (CYANOCOBALAMIN) 500 MCG tablet Take 500 mcg by mouth daily.    . [DISCONTINUED] aspirin EC 81 MG tablet Take 81 mg by mouth daily. (Patient not taking: Reported on 02/25/2020)  . [DISCONTINUED] benzonatate (  TESSALON) 100 MG capsule Take 1 capsule (100 mg total) by mouth 3 (three) times daily as needed for cough. (Patient not taking: Reported on 02/25/2020)   No facility-administered encounter medications on file as of 02/25/2020.   Past Medical History:  Diagnosis Date  . CVA (cerebral infarction)    Significant carotid artery disease status post right carotid endarterectomy, postoperatively  . Depression   . GERD (gastroesophageal reflux disease)   . Heart murmur   . Hemorrhoids   . Hernia   . History of BPH   . Hyperlipemia   .  Hypertension   . Hypothyroidism   . Mitral regurgitation     status post mitral valve repair at [facility] in [place]  . Status post coronary artery bypass grafting    Single-vessel coronary bypass grafting at time of mitral valve repair  . Stroke [facility]) Oct 2010  . Thyroid disease    hypothyroidism   Past Surgical History:  Procedure Laterality Date  . CAROTID ENDARTERECTOMY Right Nov. 2010   CEA  . CATARACT EXTRACTION    . CHOLECYSTECTOMY    . COLONOSCOPY N/A 05/30/2013   Procedure: COLONOSCOPY;  Surgeon: Rogene Houston, MD;  [facility];  Service: Endoscopy;  Laterality: N/A;  1230-rescheduled to 12:00 Ann notified pt  . CORONARY ARTERY BYPASS GRAFT    . ERCP     with biliary and pancreatic stenting  . INGUINAL HERNIA REPAIR  01/19/2011   Bilateral  . MITRAL VALVE REPAIR  April 2010  . MITRAL VALVE REPAIR  2010  . nevus removed    . TURP VAPORIZATION    . WART FULGURATION  01/03/2012   Procedure: FULGURATION ANAL WART;  Surgeon: Zenovia Jarred, MD;  Location: [place];  Service: General;  Laterality: N/A;  excision perianal condylomata   Allergies  Allergen Reactions  . Ciprofloxacin Anaphylaxis and Nausea And Vomiting    High dose  . Nsaids Other (See Comments)    Stomach upset, burning   Family history  Father lived to be 23.  To be as he had respiratory failure. His mother died at a young age. He had 1 sister who died during infancy.  Social history  Patient is married.  He has 2 sons.  One of his sons is a PA and works at [employer].  He is retired.  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  He does not smoke cigarettes or drink alcohol.   Objective: Blood pressure (!) 95/55, pulse 78/min and irregular., temperature 98.9 F (37.2 C), temperature source Oral, height _0  (1.676 m), weight 134 lb (60.8 kg). Patient is alert and in no acute distress. Conjunctiva is pink. Sclera  is nonicteric Oropharyngeal mucosa is normal. No neck masses or thyromegaly noted. Cardiac exam with irregular rhythm normal S1 and S2.  He has faint systolic murmur best heard at aortic area. Lungs are clear to auscultation. Abdomen is symmetrical.  He has bruit in epigastric region.  Bowel sounds are normal.  On palpation abdomen is soft and nontender with organomegaly or masses. No LE edema or clubbing noted.  Labs/studies Results:  CBC Latest Ref Rng & Units 02/13/2020 02/11/2020 02/10/2020  WBC 4.0 - 10.5 K/uL 6.9 10.6(H) 12.4(H)  Hemoglobin 13.0 - 17.0 g/dL 11.0(L) 12.3(L) 14.0  Hematocrit 39 - 52 % 32.6(L) 36.8(L) 41.5  Platelets 150 - 400 K/uL 190 148(L) 182    CMP Latest Ref Rng &  Units 02/14/2020 02/13/2020 02/12/2020  Glucose 70 - 99 mg/dL 105(H) 109(H) 104(H)  BUN 8 - 23 mg/dL _0 Creatinine 0.61 - 1.24 mg/dL 1.18 1.33(H) 1.41(H)  Sodium 135 - 145 mmol/L 136 136 132(L)  Potassium 3.5 - 5.1 mmol/L 3.3(L) 3.4(L) 3.1(L)  Chloride 98 - 111 mmol/L 107 105 102  CO2 22 - 32 mmol/L 21(L) 20(L) 19(L)  Calcium 8.9 - 10.3 mg/dL 8.2(L) 8.3(L) 7.8(L)  Total Protein 6.5 - 8.1 g/dL - - -  Total Bilirubin 0.3 - 1.2 mg/dL - - -  Alkaline Phos 38 - 126 U/L - - -  AST 15 - 41 U/L - - -  ALT 0 - 44 U/L - - -    Hepatic Function Latest Ref Rng & Units 02/10/2020  Total Protein 6.5 - 8.1 g/dL 7.9  Albumin 3.5 - 5.0 g/dL 3.7  AST 15 - 41 U/L 43(H)  ALT 0 - 44 U/L 27  Alk Phosphatase 38 - 126 U/L 74  Total Bilirubin 0.3 - 1.2 mg/dL 0.8    Lab data from 02/22/2020 WBC 7.7 H&H 9.3 and 28.8 MCV 84 Platelet count 430K  BUN 18 and creatinine 1.49 Glucose 189 Serum sodium 137, potassium 4.7, chloride 103, CO2 23 Serum calcium 9.1 Bilirubin 0.5, AP 105, AST 38 and ALT 81.  AST and ALT were 43 and 27 respectively 2 weeks ago.  Assessment:  #1.  Patient is 85 year old male who presents with melena and anemia.  He has been on low-dose aspirin chronically and was discontinued last week.   Patient was begun on omeprazole 20 mg in the morning and famotidine 20 mg in the evening.  His melena has cleared. His hemoglobin has dropped significantly since recent hospitalization for pneumonia. He possibly has peptic ulcer disease resulting from pneumonia requiring hospitalization and the fact that he is on low-dose aspirin. He could also have GI angiodysplasia given history of valvular heart disease.  #2.  Mildly elevated ALT.  About 2 weeks ago his AST was mildly elevated at 43.  No history of liver disease.  LFTs will be repeated within the next couple of weeks.  #3.  Elevated blood glucose.  No history of diabetes mellitus.  Recommendations  Hemoccult x1 Discontinue famotidine. Increase omeprazole to 20 mg p.o. twice daily.  Take it 30 minutes before breakfast and evening meal daily. Esophagogastroduodenoscopy to be scheduled on 02/27/2020. He will return to the hospital for preop evaluation and Covid testing tomorrow. We will check H&H and blood glucose level at the time of endoscopy. Further recommendations to follow.

## 2020-02-25 NOTE — Patient Instructions (Addendum)
Heme occult x 1 Esophagogastroduodenoscopy to be scheduled. Will check Hgb and Hct at the time of procedure.

## 2020-02-26 ENCOUNTER — Other Ambulatory Visit (HOSPITAL_COMMUNITY)
Admission: RE | Admit: 2020-02-26 | Discharge: 2020-02-26 | Disposition: A | Discharge status: Home / Self Care | Payer: Medicare Other | Source: Ambulatory Visit | Attending: Internal Medicine | Admitting: Internal Medicine

## 2020-02-26 ENCOUNTER — Encounter (HOSPITAL_COMMUNITY)
Admission: RE | Admit: 2020-02-26 | Discharge: 2020-02-26 | Disposition: A | Discharge status: Home / Self Care | Payer: Medicare Other | Source: Ambulatory Visit | Attending: Internal Medicine | Admitting: Internal Medicine

## 2020-02-26 ENCOUNTER — Encounter (HOSPITAL_COMMUNITY): Payer: Self-pay

## 2020-02-26 DIAGNOSIS — Z01812 Encounter for preprocedural laboratory examination: Secondary | ICD-10-CM | POA: Diagnosis not present

## 2020-02-26 DIAGNOSIS — Z20822 Contact with and (suspected) exposure to covid-19: Secondary | ICD-10-CM | POA: Insufficient documentation

## 2020-02-26 HISTORY — DX: Pneumonia, unspecified organism: J18.9

## 2020-02-26 HISTORY — DX: Sleep apnea, unspecified: G47.30

## 2020-02-26 NOTE — Patient Instructions (Signed)
[place]  02/26/2020     @PREFPERIOPPHARMACY @   Your procedure is scheduled on 9-15.  Report to [facility] at 2 pm.  Call this number if you have problems the morning of surgery:  [phone]   Remember:  Do not eat or drink after midnight.     Take these medicines the morning of surgery with A SIP OF WATER amlodipine, carvedilol, hydralazine, levothyroxine, omeprazole, and rapaflo    Do not wear jewelry, make-up or nail polish.  Do not wear lotions, powders, or perfumes, or deodorant.  Do not shave 48 hours prior to surgery.  Men may shave face and neck.  Do not bring valuables to the hospital.  [facility] is not responsible for any belongings or valuables.  Contacts, dentures or bridgework may not be worn into surgery.  Leave your suitcase in the car.  After surgery it may be brought to your room.  For patients admitted to the hospital, discharge time will be determined by your treatment team.  Patients discharged the day of surgery will not be allowed to drive home.   Name and phone number of your driver:   family Special instructions:  Follow specific instructions given to you by Dr. Laural Golden  Please read over the following fact sheets that you were given. Anesthesia Post-op Instructions and Care and Recovery After Surgery      Upper Endoscopy, Adult, Care After This sheet gives you information about how to care for yourself after your procedure. Your health care provider may also give you more specific instructions. If you have problems or questions, contact your health care provider. What can I expect after the procedure? After the procedure, it is common to have:  A sore throat.  Mild stomach pain or discomfort.  Bloating.  Nausea. Follow these instructions at home:   Follow instructions from your health care provider about what to eat or drink after your procedure.  Return to your normal activities as told by your health care provider. Ask  your health care provider what activities are safe for you.  Take over-the-counter and prescription medicines only as told by your health care provider.  Do not drive for 24 hours if you were given a sedative during your procedure.  Keep all follow-up visits as told by your health care provider. This is important. Contact a health care provider if you have:  A sore throat that lasts longer than one day.  Trouble swallowing. Get help right away if:  You vomit blood or your vomit looks like coffee grounds.  You have: ? A fever. ? Bloody, black, or tarry stools. ? A severe sore throat or you cannot swallow. ? Difficulty breathing. ? Severe pain in your chest or abdomen. Summary  After the procedure, it is common to have a sore throat, mild stomach discomfort, bloating, and nausea.  Do not drive for 24 hours if you were given a sedative during the procedure.  Follow instructions from your health care provider about what to eat or drink after your procedure.  Return to your normal activities as told by your health care provider. This information is not intended to replace advice given to you by your health care provider. Make sure you discuss any questions you have with your health care provider. Document Revised: 11/22/2017 Document Reviewed: 10/31/2017 Elsevier Patient Education  2020 [place] After These instructions provide you with information about caring for yourself after your procedure. Your health care  provider may also give you more specific instructions. Your treatment has been planned according to current medical practices, but problems sometimes occur. Call your health care provider if you have any problems or questions after your procedure. What can I expect after the procedure? After your procedure, you may:  Feel sleepy for several hours.  Feel clumsy and have poor balance for several hours.  Feel forgetful about what  happened after the procedure.  Have poor judgment for several hours.  Feel nauseous or vomit.  Have a sore throat if you had a breathing tube during the procedure. Follow these instructions at home: For at least 24 hours after the procedure:      Have a responsible adult stay with you. It is important to have someone help care for you until you are awake and alert.  Rest as needed.  Do not: ? Participate in activities in which you could fall or become injured. ? Drive. ? Use heavy machinery. ? Drink alcohol. ? Take sleeping pills or medicines that cause drowsiness. ? Make important decisions or sign legal documents. ? Take care of children on your own. Eating and drinking  Follow the diet that is recommended by your health care provider.  If you vomit, drink water, juice, or soup when you can drink without vomiting.  Make sure you have little or no nausea before eating solid foods. General instructions  Take over-the-counter and prescription medicines only as told by your health care provider.  If you have sleep apnea, surgery and certain medicines can increase your risk for breathing problems. Follow instructions from your health care provider about wearing your sleep device: ? Anytime you are sleeping, including during daytime naps. ? While taking prescription pain medicines, sleeping medicines, or medicines that make you drowsy.  If you smoke, do not smoke without supervision.  Keep all follow-up visits as told by your health care provider. This is important. Contact a health care provider if:  You keep feeling nauseous or you keep vomiting.  You feel light-headed.  You develop a rash.  You have a fever. Get help right away if:  You have trouble breathing. Summary  For several hours after your procedure, you may feel sleepy and have poor judgment.  Have a responsible adult stay with you for at least 24 hours or until you are awake and alert. This  information is not intended to replace advice given to you by your health care provider. Make sure you discuss any questions you have with your health care provider. Document Revised: 08/29/2017 Document Reviewed: 09/21/2015 Elsevier Patient Education  [place].

## 2020-02-27 ENCOUNTER — Ambulatory Visit (HOSPITAL_COMMUNITY): Payer: Medicare Other | Admitting: Anesthesiology

## 2020-02-27 ENCOUNTER — Encounter (HOSPITAL_COMMUNITY)
Admission: RE | Disposition: A | Discharge status: Home / Self Care | Payer: Self-pay | Source: Home / Self Care | Attending: Internal Medicine

## 2020-02-27 ENCOUNTER — Ambulatory Visit (HOSPITAL_COMMUNITY)
Admission: RE | Admit: 2020-02-27 | Discharge: 2020-02-27 | Disposition: A | Discharge status: Home / Self Care | Payer: Medicare Other | Attending: Internal Medicine | Admitting: Internal Medicine

## 2020-02-27 ENCOUNTER — Other Ambulatory Visit: Payer: Self-pay

## 2020-02-27 ENCOUNTER — Telehealth (INDEPENDENT_AMBULATORY_CARE_PROVIDER_SITE_OTHER): Payer: Self-pay

## 2020-02-27 ENCOUNTER — Encounter (HOSPITAL_COMMUNITY): Payer: Self-pay | Admitting: Internal Medicine

## 2020-02-27 DIAGNOSIS — K921 Melena: Secondary | ICD-10-CM

## 2020-02-27 DIAGNOSIS — E039 Hypothyroidism, unspecified: Secondary | ICD-10-CM | POA: Diagnosis not present

## 2020-02-27 DIAGNOSIS — Z886 Allergy status to analgesic agent status: Secondary | ICD-10-CM | POA: Diagnosis not present

## 2020-02-27 DIAGNOSIS — Z79899 Other long term (current) drug therapy: Secondary | ICD-10-CM | POA: Diagnosis not present

## 2020-02-27 DIAGNOSIS — N400 Enlarged prostate without lower urinary tract symptoms: Secondary | ICD-10-CM | POA: Diagnosis not present

## 2020-02-27 DIAGNOSIS — K319 Disease of stomach and duodenum, unspecified: Secondary | ICD-10-CM | POA: Insufficient documentation

## 2020-02-27 DIAGNOSIS — Z881 Allergy status to other antibiotic agents status: Secondary | ICD-10-CM | POA: Diagnosis not present

## 2020-02-27 DIAGNOSIS — Z8673 Personal history of transient ischemic attack (TIA), and cerebral infarction without residual deficits: Secondary | ICD-10-CM | POA: Diagnosis not present

## 2020-02-27 DIAGNOSIS — Z79890 Hormone replacement therapy (postmenopausal): Secondary | ICD-10-CM | POA: Diagnosis not present

## 2020-02-27 DIAGNOSIS — G4733 Obstructive sleep apnea (adult) (pediatric): Secondary | ICD-10-CM | POA: Diagnosis not present

## 2020-02-27 DIAGNOSIS — K269 Duodenal ulcer, unspecified as acute or chronic, without hemorrhage or perforation: Secondary | ICD-10-CM

## 2020-02-27 DIAGNOSIS — I2510 Atherosclerotic heart disease of native coronary artery without angina pectoris: Secondary | ICD-10-CM | POA: Diagnosis not present

## 2020-02-27 DIAGNOSIS — E785 Hyperlipidemia, unspecified: Secondary | ICD-10-CM | POA: Insufficient documentation

## 2020-02-27 DIAGNOSIS — K3189 Other diseases of stomach and duodenum: Secondary | ICD-10-CM | POA: Diagnosis not present

## 2020-02-27 DIAGNOSIS — Z951 Presence of aortocoronary bypass graft: Secondary | ICD-10-CM | POA: Diagnosis not present

## 2020-02-27 DIAGNOSIS — K2970 Gastritis, unspecified, without bleeding: Secondary | ICD-10-CM | POA: Insufficient documentation

## 2020-02-27 DIAGNOSIS — K264 Chronic or unspecified duodenal ulcer with hemorrhage: Secondary | ICD-10-CM | POA: Diagnosis not present

## 2020-02-27 DIAGNOSIS — D62 Acute posthemorrhagic anemia: Secondary | ICD-10-CM

## 2020-02-27 DIAGNOSIS — I10 Essential (primary) hypertension: Secondary | ICD-10-CM | POA: Diagnosis not present

## 2020-02-27 DIAGNOSIS — K219 Gastro-esophageal reflux disease without esophagitis: Secondary | ICD-10-CM | POA: Insufficient documentation

## 2020-02-27 HISTORY — PX: ESOPHAGOGASTRODUODENOSCOPY (EGD) WITH PROPOFOL: SHX5813

## 2020-02-27 HISTORY — PX: BIOPSY: SHX5522

## 2020-02-27 LAB — GLUCOSE, CAPILLARY: Glucose-Capillary: 130 mg/dL — ABNORMAL HIGH (ref 70–99)

## 2020-02-27 LAB — HEMOGLOBIN AND HEMATOCRIT, BLOOD
HCT: 31.7 % — ABNORMAL LOW (ref 39.0–52.0)
Hemoglobin: 10.2 g/dL — ABNORMAL LOW (ref 13.0–17.0)

## 2020-02-27 LAB — SARS CORONAVIRUS 2 (TAT 6-24 HRS): SARS Coronavirus 2: NEGATIVE

## 2020-02-27 SURGERY — ESOPHAGOGASTRODUODENOSCOPY (EGD) WITH PROPOFOL
Anesthesia: General

## 2020-02-27 MED ORDER — PROPOFOL 10 MG/ML IV BOLUS
INTRAVENOUS | Status: DC | PRN
  Administered 2020-02-27: 30 mg via INTRAVENOUS
  Administered 2020-02-27: 70 mg via INTRAVENOUS

## 2020-02-27 MED ORDER — GLYCOPYRROLATE PF 0.2 MG/ML IJ SOSY
PREFILLED_SYRINGE | INTRAMUSCULAR | Status: DC | PRN
  Administered 2020-02-27: .1 mg via INTRAVENOUS

## 2020-02-27 MED ORDER — STERILE WATER FOR IRRIGATION IR SOLN
Status: DC | PRN
  Administered 2020-02-27: 100 mL

## 2020-02-27 MED ORDER — LIDOCAINE VISCOUS HCL 2 % MT SOLN
15.0000 mL | Freq: Once | OROMUCOSAL | Status: AC
  Administered 2020-02-27: 15 mL via OROMUCOSAL

## 2020-02-27 MED ORDER — CHLORHEXIDINE GLUCONATE CLOTH 2 % EX PADS: 6.0000 | MEDICATED_PAD | Freq: Once | CUTANEOUS | Status: DC

## 2020-02-27 MED ORDER — LACTATED RINGERS IV SOLN: Freq: Once | INTRAVENOUS | Status: AC

## 2020-02-27 MED ORDER — LACTATED RINGERS IV SOLN: INTRAVENOUS | Status: DC | PRN

## 2020-02-27 MED ORDER — LIDOCAINE 2% (20 MG/ML) 5 ML SYRINGE
INTRAMUSCULAR | Status: DC | PRN
  Administered 2020-02-27: 60 mg via INTRAVENOUS

## 2020-02-27 MED ORDER — PROPOFOL 10 MG/ML IV BOLUS
INTRAVENOUS | Status: AC
  Filled 2020-02-27: qty 120

## 2020-02-27 MED ORDER — LIDOCAINE VISCOUS HCL 2 % MT SOLN
OROMUCOSAL | Status: AC
  Filled 2020-02-27: qty 15

## 2020-02-27 NOTE — Op Note (Signed)
[facility] Patient Name: [name] Procedure Date: 02/27/2020 2:09 PM MRN: [id number] Date of Birth: 14-Feb-1935 Attending MD: Hildred Laser , MD CSN: [id number] Age: 85 Admit Type: Outpatient Procedure:                Upper GI endoscopy Indications:              Melena Providers:                Hildred Laser, MD, [place] Page, Raphael Gibney                            Tech., Technician, [place] Risa Grill,                            Technician Referring MD:             Glenda Chroman, MD Medicines:                Propofol per Anesthesia Complications:            No immediate complications. Estimated Blood Loss:     Estimated blood loss: none. Procedure:                Pre-Anesthesia Assessment:                           - Prior to the procedure, a History and Physical                            was performed, and patient medications and                            allergies were reviewed. The patient's tolerance of                            previous anesthesia was also reviewed. The risks                            and benefits of the procedure and the sedation                            options and risks were discussed with the patient.                            All questions were answered, and informed consent                            was obtained. ASA Grade Assessment: III - A patient                            with severe systemic disease. After reviewing the                            risks and benefits, the patient was deemed in  satisfactory condition to undergo the procedure.                           After obtaining informed consent, the endoscope was                            passed under direct vision. Throughout the                            procedure, the patient's blood pressure, pulse, and                            oxygen saturations were monitored continuously. The                            [id number]-[id number] ([id number]) scope was  introduced through the                            mouth, and advanced to the second part of duodenum.                            The upper GI endoscopy was accomplished without                            difficulty. The patient tolerated the procedure                            well. Scope In: 2:38:54 PM Scope Out: 2:44:06 PM Total Procedure Duration: 0 hours 5 minutes 12 seconds  Findings:      The hypopharynx was normal.      The examined esophagus was normal.      The Z-line was regular and was found 40 cm from the incisors.      Patchy mild inflammation characterized by congestion (edema) and       erythema was found in the gastric antrum. Biopsies were taken with a       cold forceps for histology.      The exam was otherwise without abnormality.      One non-bleeding duodenal ulcer with pigmented material was found in the       duodenal bulb. The lesion was six mm by eight mm in largest dimension.      The second portion of the duodenum was normal. Impression:               - Normal hypopharynx.                           - Normal esophagus.                           - Z-line regular, 40 cm from the incisors.                           - Gastritis. Biopsied.                           - The examination was  otherwise normal.                           - Non-bleeding duodenal ulcer with tiny pigmented                            material.                           - Normal second portion of the duodenum. Moderate Sedation:      Per Anesthesia Care Recommendation:           - Resume previous diet today.                           - Continue present medications.                           - No aspirin, ibuprofen, naproxen, or other                            non-steroidal anti-inflammatory drugs for 2 weeks.                           - Await pathology results.                           - Return to GI clinic in 4 weeks. Procedure Code(s):        --- Professional ---                            [id number], Esophagogastroduodenoscopy, flexible,                            transoral; with biopsy, single or multiple Diagnosis Code(s):        --- Professional ---                           K29.70, Gastritis, unspecified, without bleeding                           K26.9, Duodenal ulcer, unspecified as acute or                            chronic, without hemorrhage or perforation                           K92.1, Melena (includes Hematochezia) CPT copyright 2019 American Medical Association. All rights reserved. The codes documented in this report are preliminary and upon coder review may  be revised to meet current compliance requirements. Hildred Laser, MD Hildred Laser, MD 02/27/2020 2:58:29 PM This report has been signed electronically. Number of Addenda: 0

## 2020-02-27 NOTE — Anesthesia Postprocedure Evaluation (Signed)
Anesthesia Post Note  Patient: [name]  Procedure(s) Performed: ESOPHAGOGASTRODUODENOSCOPY (EGD) WITH PROPOFOL (N/A ) BIOPSY  Patient location during evaluation: PACU Anesthesia Type: General Level of consciousness: awake and alert and oriented Pain management: pain level controlled Vital Signs Assessment: post-procedure vital signs reviewed and stable Respiratory status: spontaneous breathing and respiratory function stable Cardiovascular status: blood pressure returned to baseline and stable Postop Assessment: no apparent nausea or vomiting Anesthetic complications: no   No complications documented.   Last Vitals:  Vitals:   02/27/20 1450 02/27/20 1500  BP: 104/62 (!) 121/54  Pulse: 69 63  Resp: 17 19  Temp: 36.7 C   SpO2: 97% 97%    Last Pain:  Vitals:   02/27/20 1450  TempSrc:   PainSc: 0-No pain                 Salvator Seppala C [place]

## 2020-02-27 NOTE — Telephone Encounter (Deleted)
he

## 2020-02-27 NOTE — Transfer of Care (Signed)
Immediate Anesthesia Transfer of Care Note  Patient: [name]  Procedure(s) Performed: ESOPHAGOGASTRODUODENOSCOPY (EGD) WITH PROPOFOL (N/A ) BIOPSY  Patient Location: PACU  Anesthesia Type:General  Level of Consciousness: awake and patient cooperative  Airway & Oxygen Therapy: Patient Spontanous Breathing  Post-op Assessment: Report given to RN and Post -op Vital signs reviewed and stable  Post vital signs: Reviewed and stable  Last Vitals:  Vitals Value Taken Time  BP 104/62 02/27/20 1450  Temp    Pulse 72 02/27/20 1451  Resp 18 02/27/20 1451  SpO2 97 % 02/27/20 1451  Vitals shown include unvalidated device data.  Last Pain:  Vitals:   02/27/20 1434  TempSrc:   PainSc: 0-No pain         Complications: No complications documented.

## 2020-02-27 NOTE — Discharge Instructions (Signed)
No aspirin or NSAIDs for 2 weeks. Can resume low-dose aspirin after 2 weeks if recommended by your primary care physician. Resume other medications and diet as before. Resume usual diet. No driving for 24 hours. Physician will call with results of biopsy.       Upper Endoscopy, Adult, Care After This sheet gives you information about how to care for yourself after your procedure. Your health care provider may also give you more specific instructions. If you have problems or questions, contact your health care provider. What can I expect after the procedure? After the procedure, it is common to have:  A sore throat.  Mild stomach pain or discomfort.  Bloating.  Nausea. Follow these instructions at home:   Follow instructions from your health care provider about what to eat or drink after your procedure.  Return to your normal activities as told by your health care provider. Ask your health care provider what activities are safe for you.  Take over-the-counter and prescription medicines only as told by your health care provider.  Do not drive for 24 hours if you were given a sedative during your procedure.  Keep all follow-up visits as told by your health care provider. This is important. Contact a health care provider if you have:  A sore throat that lasts longer than one day.  Trouble swallowing. Get help right away if:  You vomit blood or your vomit looks like coffee grounds.  You have: ? A fever. ? Bloody, black, or tarry stools. ? A severe sore throat or you cannot swallow. ? Difficulty breathing. ? Severe pain in your chest or abdomen. Summary  After the procedure, it is common to have a sore throat, mild stomach discomfort, bloating, and nausea.  Do not drive for 24 hours if you were given a sedative during the procedure.  Follow instructions from your health care provider about what to eat or drink after your procedure.  Return to your normal  activities as told by your health care provider. This information is not intended to replace advice given to you by your health care provider. Make sure you discuss any questions you have with your health care provider. Document Revised: 11/22/2017 Document Reviewed: 10/31/2017 Elsevier Patient Education  2020 [place] After These instructions provide you with information about caring for yourself after your procedure. Your health care provider may also give you more specific instructions. Your treatment has been planned according to current medical practices, but problems sometimes occur. Call your health care provider if you have any problems or questions after your procedure. What can I expect after the procedure? After your procedure, you may:  Feel sleepy for several hours.  Feel clumsy and have poor balance for several hours.  Feel forgetful about what happened after the procedure.  Have poor judgment for several hours.  Feel nauseous or vomit.  Have a sore throat if you had a breathing tube during the procedure. Follow these instructions at home: For at least 24 hours after the procedure:      Have a responsible adult stay with you. It is important to have someone help care for you until you are awake and alert.  Rest as needed.  Do not: ? Participate in activities in which you could fall or become injured. ? Drive. ? Use heavy machinery. ? Drink alcohol. ? Take sleeping pills or medicines that cause drowsiness. ? Make important decisions or sign legal documents. ?  Take care of children on your own. Eating and drinking  Follow the diet that is recommended by your health care provider.  If you vomit, drink water, juice, or soup when you can drink without vomiting.  Make sure you have little or no nausea before eating solid foods. General instructions  Take over-the-counter and prescription medicines only as told by  your health care provider.  If you have sleep apnea, surgery and certain medicines can increase your risk for breathing problems. Follow instructions from your health care provider about wearing your sleep device: ? Anytime you are sleeping, including during daytime naps. ? While taking prescription pain medicines, sleeping medicines, or medicines that make you drowsy.  If you smoke, do not smoke without supervision.  Keep all follow-up visits as told by your health care provider. This is important. Contact a health care provider if:  You keep feeling nauseous or you keep vomiting.  You feel light-headed.  You develop a rash.  You have a fever. Get help right away if:  You have trouble breathing. Summary  For several hours after your procedure, you may feel sleepy and have poor judgment.  Have a responsible adult stay with you for at least 24 hours or until you are awake and alert. This information is not intended to replace advice given to you by your health care provider. Make sure you discuss any questions you have with your health care provider. Document Revised: 08/29/2017 Document Reviewed: 09/21/2015 Elsevier Patient Education  [place].

## 2020-02-27 NOTE — Anesthesia Preprocedure Evaluation (Signed)
Anesthesia Evaluation  Patient identified by MRN, date of birth, ID band Patient awake    Reviewed: Allergy & Precautions, NPO status , Patient's Chart, lab work & pertinent test results  History of Anesthesia Complications Negative for: history of anesthetic complications  Airway   TM Distance: >3 FB Neck ROM: Full    Dental  (+) Dental Advisory Given, Teeth Intact   Pulmonary sleep apnea and Continuous Positive Airway Pressure Ventilation , pneumonia, resolved,    Pulmonary exam normal breath sounds clear to auscultation       Cardiovascular Exercise Tolerance: Good hypertension, Pt. on medications + CAD, + CABG and + DVT (carotid endarterectomy )  Normal cardiovascular exam+ Valvular Problems/Murmurs (mitral valve repair)  Rhythm:Regular Rate:Normal     Neuro/Psych PSYCHIATRIC DISORDERS Depression CVA    GI/Hepatic Neg liver ROS, GERD  Medicated and Controlled,  Endo/Other  Hypothyroidism   Renal/GU Renal disease     Musculoskeletal   Abdominal   Peds  Hematology  (+) anemia ,   Anesthesia Other Findings   Reproductive/Obstetrics                            Anesthesia Physical Anesthesia Plan  ASA: III  Anesthesia Plan: General   Post-op Pain Management:    Induction: Intravenous  PONV Risk Score and Plan: TIVA  Airway Management Planned: Nasal Cannula and Natural Airway  Additional Equipment:   Intra-op Plan:   Post-operative Plan:   Informed Consent: I have reviewed the patients History and Physical, chart, labs and discussed the procedure including the risks, benefits and alternatives for the proposed anesthesia with the patient or authorized representative who has indicated his/her understanding and acceptance.     Dental advisory given  Plan Discussed with: CRNA and Surgeon  Anesthesia Plan Comments:         Anesthesia Quick Evaluation

## 2020-02-27 NOTE — Interval H&P Note (Signed)
Patient has no new complaints.  He still has not gotten his strength back.  He denies chest pain shortness of breath or abdominal pain.  He has not had any more episodes of melena. H&H from this afternoon is pending. Blood glucose is 130 mg. Patient is agreeable to proceed with esophagogastroduodenoscopy. History and Physical Interval Note:  02/27/2020 2:26 PM  [name]  has presented today for surgery, with the diagnosis of melena, anemia.  The various methods of treatment have been discussed with the patient and family. After consideration of risks, benefits and other options for treatment, the patient has consented to  Procedure(s) with comments: ESOPHAGOGASTRODUODENOSCOPY (EGD) WITH PROPOFOL (N/A) - 325 as a surgical intervention.  The patient's history has been reviewed, patient examined, no change in status, stable for surgery.  I have reviewed the patient's chart and labs.  Questions were answered to the patient's satisfaction.     [employer]

## 2020-02-27 NOTE — Telephone Encounter (Signed)
error 

## 2020-02-27 NOTE — Anesthesia Procedure Notes (Signed)
Procedure Name: MAC Date/Time: 02/27/2020 2:41 PM Performed by: Orlie Dakin, CRNA Pre-anesthesia Checklist: Patient identified, Emergency Drugs available, Suction available and Patient being monitored Patient Re-evaluated:Patient Re-evaluated prior to induction Oxygen Delivery Method: Nasal cannula Preoxygenation: Pre-oxygenation with 100% oxygen Induction Type: IV induction Placement Confirmation: positive ETCO2

## 2020-02-28 ENCOUNTER — Telehealth (INDEPENDENT_AMBULATORY_CARE_PROVIDER_SITE_OTHER): Payer: Self-pay

## 2020-02-28 NOTE — Telephone Encounter (Signed)
Results noted. Patient needs office visit with me in 4 weeks.

## 2020-02-28 NOTE — Telephone Encounter (Signed)
° °  Diagnosis:    Result(s)   Card 1: Positive:         Completed by: Pete Glatter   HEMOCCULT SENSA DEVELOPER: LOT#:  [id number] EXPIRATION  DATE: 04/2020   HEMOCCULT SENSA CARD:  LOT#:  [id number] EXPIRATION DATE: 05/2020   CARD CONTROL RESULTS:  POSITIVE: Positive  NEGATIVE: Negative    ADDITIONAL COMMENTS:  Dr. Laural Golden made aware of the results.

## 2020-02-29 ENCOUNTER — Other Ambulatory Visit: Payer: Self-pay

## 2020-02-29 LAB — SURGICAL PATHOLOGY

## 2020-03-01 ENCOUNTER — Other Ambulatory Visit (INDEPENDENT_AMBULATORY_CARE_PROVIDER_SITE_OTHER): Payer: Self-pay | Admitting: Internal Medicine

## 2020-03-01 DIAGNOSIS — D649 Anemia, unspecified: Secondary | ICD-10-CM

## 2020-03-03 ENCOUNTER — Encounter (HOSPITAL_COMMUNITY): Payer: Self-pay | Admitting: Internal Medicine

## 2020-03-03 DIAGNOSIS — N1831 Chronic kidney disease, stage 3a: Secondary | ICD-10-CM | POA: Diagnosis not present

## 2020-03-03 DIAGNOSIS — J181 Lobar pneumonia, unspecified organism: Secondary | ICD-10-CM | POA: Diagnosis not present

## 2020-03-03 DIAGNOSIS — I2510 Atherosclerotic heart disease of native coronary artery without angina pectoris: Secondary | ICD-10-CM | POA: Diagnosis not present

## 2020-03-03 DIAGNOSIS — I129 Hypertensive chronic kidney disease with stage 1 through stage 4 chronic kidney disease, or unspecified chronic kidney disease: Secondary | ICD-10-CM | POA: Diagnosis not present

## 2020-03-10 DIAGNOSIS — I129 Hypertensive chronic kidney disease with stage 1 through stage 4 chronic kidney disease, or unspecified chronic kidney disease: Secondary | ICD-10-CM | POA: Diagnosis not present

## 2020-03-10 DIAGNOSIS — J181 Lobar pneumonia, unspecified organism: Secondary | ICD-10-CM | POA: Diagnosis not present

## 2020-03-10 DIAGNOSIS — N179 Acute kidney failure, unspecified: Secondary | ICD-10-CM | POA: Diagnosis not present

## 2020-03-10 DIAGNOSIS — N1831 Chronic kidney disease, stage 3a: Secondary | ICD-10-CM | POA: Diagnosis not present

## 2020-03-10 DIAGNOSIS — I951 Orthostatic hypotension: Secondary | ICD-10-CM | POA: Diagnosis not present

## 2020-03-10 DIAGNOSIS — I2510 Atherosclerotic heart disease of native coronary artery without angina pectoris: Secondary | ICD-10-CM | POA: Diagnosis not present

## 2020-03-12 DIAGNOSIS — J181 Lobar pneumonia, unspecified organism: Secondary | ICD-10-CM | POA: Diagnosis not present

## 2020-03-12 DIAGNOSIS — N1831 Chronic kidney disease, stage 3a: Secondary | ICD-10-CM | POA: Diagnosis not present

## 2020-03-12 DIAGNOSIS — I2510 Atherosclerotic heart disease of native coronary artery without angina pectoris: Secondary | ICD-10-CM | POA: Diagnosis not present

## 2020-03-12 DIAGNOSIS — N179 Acute kidney failure, unspecified: Secondary | ICD-10-CM | POA: Diagnosis not present

## 2020-03-12 DIAGNOSIS — I129 Hypertensive chronic kidney disease with stage 1 through stage 4 chronic kidney disease, or unspecified chronic kidney disease: Secondary | ICD-10-CM | POA: Diagnosis not present

## 2020-03-12 DIAGNOSIS — I951 Orthostatic hypotension: Secondary | ICD-10-CM | POA: Diagnosis not present

## 2020-03-16 DIAGNOSIS — I2510 Atherosclerotic heart disease of native coronary artery without angina pectoris: Secondary | ICD-10-CM | POA: Diagnosis not present

## 2020-03-16 DIAGNOSIS — Z8673 Personal history of transient ischemic attack (TIA), and cerebral infarction without residual deficits: Secondary | ICD-10-CM | POA: Diagnosis not present

## 2020-03-16 DIAGNOSIS — I951 Orthostatic hypotension: Secondary | ICD-10-CM | POA: Diagnosis not present

## 2020-03-16 DIAGNOSIS — Z951 Presence of aortocoronary bypass graft: Secondary | ICD-10-CM | POA: Diagnosis not present

## 2020-03-16 DIAGNOSIS — Z9181 History of falling: Secondary | ICD-10-CM | POA: Diagnosis not present

## 2020-03-16 DIAGNOSIS — E785 Hyperlipidemia, unspecified: Secondary | ICD-10-CM | POA: Diagnosis not present

## 2020-03-16 DIAGNOSIS — K219 Gastro-esophageal reflux disease without esophagitis: Secondary | ICD-10-CM | POA: Diagnosis not present

## 2020-03-16 DIAGNOSIS — I129 Hypertensive chronic kidney disease with stage 1 through stage 4 chronic kidney disease, or unspecified chronic kidney disease: Secondary | ICD-10-CM | POA: Diagnosis not present

## 2020-03-16 DIAGNOSIS — Z7982 Long term (current) use of aspirin: Secondary | ICD-10-CM | POA: Diagnosis not present

## 2020-03-16 DIAGNOSIS — F329 Major depressive disorder, single episode, unspecified: Secondary | ICD-10-CM | POA: Diagnosis not present

## 2020-03-16 DIAGNOSIS — N1831 Chronic kidney disease, stage 3a: Secondary | ICD-10-CM | POA: Diagnosis not present

## 2020-03-16 DIAGNOSIS — G4733 Obstructive sleep apnea (adult) (pediatric): Secondary | ICD-10-CM | POA: Diagnosis not present

## 2020-03-16 DIAGNOSIS — E039 Hypothyroidism, unspecified: Secondary | ICD-10-CM | POA: Diagnosis not present

## 2020-03-16 DIAGNOSIS — N179 Acute kidney failure, unspecified: Secondary | ICD-10-CM | POA: Diagnosis not present

## 2020-03-16 DIAGNOSIS — N400 Enlarged prostate without lower urinary tract symptoms: Secondary | ICD-10-CM | POA: Diagnosis not present

## 2020-03-16 DIAGNOSIS — J181 Lobar pneumonia, unspecified organism: Secondary | ICD-10-CM | POA: Diagnosis not present

## 2020-03-21 DIAGNOSIS — I2510 Atherosclerotic heart disease of native coronary artery without angina pectoris: Secondary | ICD-10-CM | POA: Diagnosis not present

## 2020-03-21 DIAGNOSIS — N179 Acute kidney failure, unspecified: Secondary | ICD-10-CM | POA: Diagnosis not present

## 2020-03-21 DIAGNOSIS — N1831 Chronic kidney disease, stage 3a: Secondary | ICD-10-CM | POA: Diagnosis not present

## 2020-03-21 DIAGNOSIS — I129 Hypertensive chronic kidney disease with stage 1 through stage 4 chronic kidney disease, or unspecified chronic kidney disease: Secondary | ICD-10-CM | POA: Diagnosis not present

## 2020-03-21 DIAGNOSIS — J181 Lobar pneumonia, unspecified organism: Secondary | ICD-10-CM | POA: Diagnosis not present

## 2020-03-21 DIAGNOSIS — I951 Orthostatic hypotension: Secondary | ICD-10-CM | POA: Diagnosis not present

## 2020-03-25 ENCOUNTER — Ambulatory Visit (INDEPENDENT_AMBULATORY_CARE_PROVIDER_SITE_OTHER): Payer: Medicare Other | Admitting: Internal Medicine

## 2020-03-25 ENCOUNTER — Encounter (INDEPENDENT_AMBULATORY_CARE_PROVIDER_SITE_OTHER): Payer: Self-pay | Admitting: Internal Medicine

## 2020-03-25 ENCOUNTER — Other Ambulatory Visit: Payer: Self-pay

## 2020-03-25 VITALS — BP 106/60 | HR 67 | Temp 97.0°F | Ht 66.0 in | Wt 136.1 lb

## 2020-03-25 DIAGNOSIS — I2510 Atherosclerotic heart disease of native coronary artery without angina pectoris: Secondary | ICD-10-CM | POA: Diagnosis not present

## 2020-03-25 DIAGNOSIS — K269 Duodenal ulcer, unspecified as acute or chronic, without hemorrhage or perforation: Secondary | ICD-10-CM | POA: Diagnosis not present

## 2020-03-25 DIAGNOSIS — D649 Anemia, unspecified: Secondary | ICD-10-CM | POA: Diagnosis not present

## 2020-03-25 MED ORDER — PANTOPRAZOLE SODIUM 40 MG PO TBEC: 40.0000 mg | DELAYED_RELEASE_TABLET | Freq: Two times a day (BID) | ORAL | 1 refills | Status: DC

## 2020-03-25 NOTE — Progress Notes (Addendum)
Presenting complaint;  Follow-up for peptic ulcer disease/duodenal ulcer.  Database and subjective:  Patient is 85 year old male who is here for scheduled visit accompanied by his wife and their son [name]. He was last seen on 02/25/2020 for melena and anemia. Patient was hospitalized and treated for community-acquired pneumonia back in August 2021 and responded to antibiotic therapy. Patient had been on low-dose aspirin which was discontinued as soon as he was found to have melena and he was begun on omeprazole 20 mg in the morning with famotidine in the evening. His hemoglobin was 9.3 on 02/22/2020. He underwent EGD on 02/27/2020 revealing nonerosive antral gastritis and a bulbar ulcer with tiny brown pigment but no active bleeding. PPI dose was doubled. Gastric biopsy was negative for H. pylori gastritis. Hemoglobin on the day of the procedure was 10.2.  Patient states that he is feeling some better. He has not had melena anymore. He remains on very limited diet. He is vegetarian. He feels food is not digesting but he has not had any nausea vomiting or heartburn. His stool is loose but he generally has 1 bowel movement per day. He also denies abdominal pain or rectal bleeding. Previously he was having generalized sweating at night and now it is around his neck and groin. He was also getting up in the middle of night with thirst and having to drink ginger ale. He has not required any ginger ale or liquids at night. He has not lost any weight. His weight is actually up by 2 pounds. No history of fever. His son [name] states that omeprazole has gelatin in it and is requesting that it be changed to pantoprazole as it does not have gelatin.  Current Medications: Outpatient Encounter Medications as of 03/25/2020  Medication Sig  . amLODipine (NORVASC) 10 MG tablet TAKE ONE TABLET BY MOUTH ONE TIME DAILY  . bimatoprost (LUMIGAN) 0.01 % SOLN Place 1 drop into both eyes at bedtime.  . carvedilol (COREG)  12.5 MG tablet Take 1 tablet by mouth twice daily  . CEFUROXIME AXETIL PO Take 500 mg by mouth in the morning and at bedtime.  . cycloSPORINE (RESTASIS) 0.05 % ophthalmic emulsion Place 1 drop into both eyes at bedtime.  . hydrALAZINE (APRESOLINE) 25 MG tablet Take 1 tablet (25 mg total) by mouth 3 (three) times daily.  Marland Kitchen levothyroxine (LEVOTHROID) 50 MCG tablet Take 50 mcg by mouth every other day.   . levothyroxine (SYNTHROID) 75 MCG tablet Take 75 mcg by mouth every other day. Alternates every other day with 59m  . losartan-hydrochlorothiazide (HYZAAR) 50-12.5 MG tablet Take 1 tablet by mouth daily.  . mirabegron ER (MYRBETRIQ) 25 MG TB24 tablet Take 25 mg by mouth at bedtime.  .Marland Kitchenomeprazole (PRILOSEC) 20 MG capsule Take 1 capsule (20 mg total) by mouth 2 (two) times daily before a meal.  . rosuvastatin (CRESTOR) 5 MG tablet Take 2.5 mg by mouth daily.   . silodosin (RAPAFLO) 8 MG CAPS capsule Take 8 mg by mouth daily with breakfast.  . vitamin B-12 (CYANOCOBALAMIN) 500 MCG tablet Take 500 mcg by mouth daily.     No facility-administered encounter medications on file as of 03/25/2020.     Objective: Blood pressure 106/60, pulse 67, temperature (!) 97 F (36.1 C), temperature source Temporal, height '5\' 6"'  (16.160m), weight 136 lb 1.6 oz (61.7 kg). Patient is alert and in no acute distress. Conjunctiva is pink. Sclera is nonicteric Oropharyngeal mucosa is normal. No neck masses or thyromegaly noted. Cardiac  exam with regular rhythm normal S1 and S2. He has faint murmur at aortic area. Lungs are clear to auscultation. Abdomen bowel sounds are normal. On palpation abdomen is soft and nontender with no organomegaly or masses. No LE edema or clubbing noted.  Labs/studies Results:  CBC Latest Ref Rng & Units 02/27/2020 02/13/2020 02/11/2020  WBC 4.0 - 10.5 K/uL - 6.9 10.6(H)  Hemoglobin 13.0 - 17.0 g/dL 10.2(L) 11.0(L) 12.3(L)  Hematocrit 39 - 52 % 31.7(L) 32.6(L) 36.8(L)  Platelets 150 -  400 K/uL - 190 148(L)    CMP Latest Ref Rng & Units 02/14/2020 02/13/2020 02/12/2020  Glucose 70 - 99 mg/dL 105(H) 109(H) 104(H)  BUN 8 - 23 mg/dL '14 18 22  ' Creatinine 0.61 - 1.24 mg/dL 1.18 1.33(H) 1.41(H)  Sodium 135 - 145 mmol/L 136 136 132(L)  Potassium 3.5 - 5.1 mmol/L 3.3(L) 3.4(L) 3.1(L)  Chloride 98 - 111 mmol/L 107 105 102  CO2 22 - 32 mmol/L 21(L) 20(L) 19(L)  Calcium 8.9 - 10.3 mg/dL 8.2(L) 8.3(L) 7.8(L)  Total Protein 6.5 - 8.1 g/dL - - -  Total Bilirubin 0.3 - 1.2 mg/dL - - -  Alkaline Phos 38 - 126 U/L - - -  AST 15 - 41 U/L - - -  ALT 0 - 44 U/L - - -    Hepatic Function Latest Ref Rng & Units 02/10/2020  Total Protein 6.5 - 8.1 g/dL 7.9  Albumin 3.5 - 5.0 g/dL 3.7  AST 15 - 41 U/L 43(H)  ALT 0 - 44 U/L 27  Alk Phosphatase 38 - 126 U/L 74  Total Bilirubin 0.3 - 1.2 mg/dL 0.8    H&H was 9.3 and 28.8 on 02/22/2020.  Assessment:  #1. Duodenal ulcer. Gastric biopsy was negative for H. pylori gastritis. I would go one step further and also check serology. Will change PPI to pantoprazole. He is currently on omeprazole which has gelatin and furthermore omeprazole may be causing GI upset and diarrhea.  #2. Anemia secondary to blood loss from duodenal ulcer. His hemoglobin 4 weeks ago was 10.2. He is due for blood work.  #3. Nocturnal diaphoresis. Not sure if it is due to one of his medications. He is seeing Dr. Woody Seller tomorrow and it would be a good idea for him to check TSH.  #4. GI upset/diarrhea. He is only having 1 loose stool per day. He is on limited diet. He was on broad-spectrum antibiotics or 6 weeks ago. If he does not feel better with the change in PPI would consider stool testing for C. difficile.  Plan:  Discontinue omeprazole. Begin pantoprazole 40 mg by mouth before breakfast and evening meal daily for 1 month and thereafter 40 mg p.o. every morning. He will need to continue PPI chronically. Patient will go to the lab for CBC, H. pylori serology and serum  albumin. Patient can resume low-dose aspirin if Dr. Woody Seller feels he should be on it. Office visit in 3 months.   Addendum created on 04/01/2020 Patient's son Naitik sent to me labs on his father. Hemoglobin is 10.6.  Was 10.84-monthago. Glucose 113. Stool testing not done as he is not having diarrhea anymore. H. pylori still pending. Will check CBC in 1 month.

## 2020-03-26 DIAGNOSIS — I701 Atherosclerosis of renal artery: Secondary | ICD-10-CM | POA: Diagnosis not present

## 2020-03-26 DIAGNOSIS — I700 Atherosclerosis of aorta: Secondary | ICD-10-CM | POA: Diagnosis not present

## 2020-03-26 DIAGNOSIS — Z299 Encounter for prophylactic measures, unspecified: Secondary | ICD-10-CM | POA: Diagnosis not present

## 2020-03-26 DIAGNOSIS — R5383 Other fatigue: Secondary | ICD-10-CM | POA: Diagnosis not present

## 2020-03-26 DIAGNOSIS — I10 Essential (primary) hypertension: Secondary | ICD-10-CM | POA: Diagnosis not present

## 2020-03-26 DIAGNOSIS — R197 Diarrhea, unspecified: Secondary | ICD-10-CM | POA: Diagnosis not present

## 2020-03-26 DIAGNOSIS — Z79899 Other long term (current) drug therapy: Secondary | ICD-10-CM | POA: Diagnosis not present

## 2020-03-27 ENCOUNTER — Ambulatory Visit (INDEPENDENT_AMBULATORY_CARE_PROVIDER_SITE_OTHER): Payer: Medicare Other | Admitting: Internal Medicine

## 2020-03-27 DIAGNOSIS — J181 Lobar pneumonia, unspecified organism: Secondary | ICD-10-CM | POA: Diagnosis not present

## 2020-03-27 DIAGNOSIS — I129 Hypertensive chronic kidney disease with stage 1 through stage 4 chronic kidney disease, or unspecified chronic kidney disease: Secondary | ICD-10-CM | POA: Diagnosis not present

## 2020-03-27 DIAGNOSIS — N179 Acute kidney failure, unspecified: Secondary | ICD-10-CM | POA: Diagnosis not present

## 2020-03-27 DIAGNOSIS — I2510 Atherosclerotic heart disease of native coronary artery without angina pectoris: Secondary | ICD-10-CM | POA: Diagnosis not present

## 2020-03-27 DIAGNOSIS — N1831 Chronic kidney disease, stage 3a: Secondary | ICD-10-CM | POA: Diagnosis not present

## 2020-03-27 DIAGNOSIS — I951 Orthostatic hypotension: Secondary | ICD-10-CM | POA: Diagnosis not present

## 2020-04-01 ENCOUNTER — Telehealth (INDEPENDENT_AMBULATORY_CARE_PROVIDER_SITE_OTHER): Payer: Self-pay | Admitting: Internal Medicine

## 2020-04-01 ENCOUNTER — Ambulatory Visit: Payer: Medicare Other | Admitting: Cardiology

## 2020-04-02 ENCOUNTER — Other Ambulatory Visit (INDEPENDENT_AMBULATORY_CARE_PROVIDER_SITE_OTHER): Payer: Self-pay | Admitting: *Deleted

## 2020-04-02 DIAGNOSIS — K269 Duodenal ulcer, unspecified as acute or chronic, without hemorrhage or perforation: Secondary | ICD-10-CM

## 2020-04-02 DIAGNOSIS — D62 Acute posthemorrhagic anemia: Secondary | ICD-10-CM

## 2020-04-02 DIAGNOSIS — K921 Melena: Secondary | ICD-10-CM

## 2020-04-02 DIAGNOSIS — D649 Anemia, unspecified: Secondary | ICD-10-CM

## 2020-04-02 NOTE — Telephone Encounter (Signed)
Orders were done and faxed to [facility] in [facility]

## 2020-04-04 DIAGNOSIS — K269 Duodenal ulcer, unspecified as acute or chronic, without hemorrhage or perforation: Secondary | ICD-10-CM | POA: Diagnosis not present

## 2020-04-07 ENCOUNTER — Other Ambulatory Visit: Payer: Self-pay

## 2020-04-07 ENCOUNTER — Ambulatory Visit: Payer: Medicare Other | Admitting: Cardiology

## 2020-04-07 ENCOUNTER — Encounter: Payer: Self-pay | Admitting: Cardiology

## 2020-04-07 VITALS — BP 140/47 | HR 65 | Resp 15 | Ht 66.0 in | Wt 138.0 lb

## 2020-04-07 DIAGNOSIS — E7800 Pure hypercholesterolemia, unspecified: Secondary | ICD-10-CM | POA: Diagnosis not present

## 2020-04-07 DIAGNOSIS — I10 Essential (primary) hypertension: Secondary | ICD-10-CM | POA: Diagnosis not present

## 2020-04-07 DIAGNOSIS — I2510 Atherosclerotic heart disease of native coronary artery without angina pectoris: Secondary | ICD-10-CM

## 2020-04-07 DIAGNOSIS — R0989 Other specified symptoms and signs involving the circulatory and respiratory systems: Secondary | ICD-10-CM

## 2020-04-07 DIAGNOSIS — Z9889 Other specified postprocedural states: Secondary | ICD-10-CM | POA: Diagnosis not present

## 2020-04-07 NOTE — Progress Notes (Signed)
Primary Physician/Referring:  Glenda Chroman, MD  Patient ID: [name], male    DOB: Apr 08, 1935, 85 y.o.   MRN: [id number]  Chief Complaint  Patient presents with  . Follow-up    6 month  . Coronary Artery Disease  . Hypertension   HPI:    HPI: [name]  is a 85 y.o. with history of known coronary artery disease, history of 1 vessel CABG with LIMA to LAD and mitral valve repair done in [place], in May 2010, history of stroke due to symptomatic right carotid artery stenosis in September 2010, with right carotid endarterectomy in November 2010, hypertension, hyperlipidemia, trifascicular block.  Patient was admitted to the hospital on 02/10/2020 with community-acquired pneumonia and acute renal failure, amlodipine was discontinued due to orthostasis and low blood pressure.  He was also found to be in atrial fibrillation with rapid ventricular response.   He was evaluated for GI bleed and melena on 02/25/2020 and found to have peptic ulcer disease and presently on PPI.  Aspirin was discontinued.  He now presents to the office for 46-monthoffice visit.  Only new thing from last time is that he has been perspiring on his head and also in the groin area that is bothersome.   Past Medical History:  Diagnosis Date  . CVA (cerebral infarction)    Significant carotid artery disease status post right carotid endarterectomy, postoperatively  . Depression   . GERD (gastroesophageal reflux disease)   . Heart murmur   . Hemorrhoids   . Hernia   . History of BPH   . Hyperlipemia   . Hypertension   . Hypothyroidism   . Mitral regurgitation     status [facility] . Pneumonia   . Sleep apnea   . Status post coronary artery bypass grafting    Single-vessel coronary bypass grafting at time of mitral valve repair  . Stroke ([facility] Oct 2010  . Thyroid disease    hypothyroidism   Past Surgical History:  Procedure Laterality  Date  . BIOPSY  02/27/2020   Procedure: BIOPSY;  Surgeon: RRogene Houston MD;  [facility];  Service: Endoscopy;;  . CAROTID ENDARTERECTOMY Right Nov. 2010   CEA  . CATARACT EXTRACTION    . CHOLECYSTECTOMY    . COLONOSCOPY N/A 05/30/2013   Procedure: COLONOSCOPY;  Surgeon: NRogene Houston MD;  [facility]: [facility];  Service: Endoscopy;  Laterality: N/A;  1230-rescheduled to 12:00 Ann notified pt  . CORONARY ARTERY BYPASS GRAFT  2010  . ERCP     with biliary and pancreatic stenting  . ESOPHAGOGASTRODUODENOSCOPY (EGD) WITH PROPOFOL N/A 02/27/2020   Procedure: ESOPHAGOGASTRODUODENOSCOPY (EGD) WITH PROPOFOL;  Surgeon: RRogene Houston MD;  [facility];  Service: Endoscopy;  Laterality: N/A;  325  . INGUINAL HERNIA REPAIR  01/19/2011   Bilateral  . MITRAL VALVE REPAIR  April 2010  . MITRAL VALVE REPAIR  2010  . nevus removed    . TURP VAPORIZATION    . WART FULGURATION  01/03/2012   Procedure: FULGURATION ANAL WART;  Surgeon: BZenovia Jarred MD;  [place] [facility]  Service: General;  Laterality: N/A;  excision perianal condylomata   Social History   Tobacco Use  . Smoking status: Never Smoker  . Smokeless tobacco: Never Used  Substance Use Topics  . Alcohol use: No   Marital Status: Married   ROS  Review of Systems  Cardiovascular: Positive for leg swelling. Negative for chest pain and dyspnea on exertion.  Gastrointestinal: Negative for bloating and heartburn.   Objective  Blood pressure (!) 140/47, pulse 65, resp. rate 15, height _0  (1.676 m), weight 138 lb (62.6 kg), SpO2 100 %. Body mass index is 22.27 kg/m.   Vitals with BMI 04/07/2020 04/07/2020 03/25/2020  Height - _1  _2   Weight - 138 lbs 136 lbs 2 oz  BMI - 47.82 95.62  Systolic 130 865 784  Diastolic 47 42 60  Pulse 65 66 67     Physical Exam Vitals reviewed.  Constitutional:      Appearance: He is well-developed.  Cardiovascular:     Rate and Rhythm:  Normal rate and regular rhythm.     Pulses: Normal pulses and intact distal pulses.          Carotid pulses are on the right side with bruit and on the left side with bruit.    Heart sounds: Murmur heard.  Blowing midsystolic murmur is present at the upper right sternal border radiating to the apex.      Comments: Right carotid endarterectomy scar noted. Trace ankle edema.  No JVD, Pulmonary:     Effort: Pulmonary effort is normal. No accessory muscle usage or respiratory distress.     Breath sounds: Normal breath sounds.  Abdominal:     General: Bowel sounds are normal. There is abdominal bruit.     Palpations: Abdomen is soft. There is no pulsatile mass.  Musculoskeletal:        General: Normal range of motion.  Neurological:     Mental Status: He is alert and oriented to person, place, and time.    Radiology: No results found.  Laboratory examination:   External labs:  02/22/2020:  CBC  Hb 9.3/HCT 28.8, normal indicis, platelets 430.  Serum glucose 189, BUN 18, creatinine 1.49, EGFR 43/50 mL.  Cholesterol, total 128.000 M 01/07/2020 HDL 40.000 MG 01/07/2020 LDL-C 83.000 MG 12/25/2018 Triglycerides 102.000 M 01/07/2020  Hemoglobin 10.200 g/d 02/27/2020 Platelets 190.000 K/ 02/13/2020  Creatinine, Serum 1.180 mg/ 02/14/2020 Potassium 3.300 mm 02/14/2020 ALT (SGPT) 27.000 U/L 02/10/2020 TSH 3.410 01/07/2020  Cholesterol, total 141.000 M 12/25/2018 HDL 39.000 M 12/25/2018 LDL 83 Triglycerides 94.000 M 12/25/2018  A1C N/D TSH 3.350 12/25/2018  Hemoglobin 12.500 G/ 12/25/2018 Platelets 207.000 X 12/25/2018  Creatinine, Serum 1.580 MG/ 12/25/2018 Potassium 5.100 MM 01/26/2016 ALT (SGPT) 9.000 IU/L 12/25/2018  Medications   Current Outpatient Medications on File Prior to Visit  Medication Sig Dispense Refill  . amLODipine (NORVASC) 10 MG tablet TAKE ONE TABLET BY MOUTH ONE TIME DAILY 180 tablet 3  . bimatoprost (LUMIGAN) 0.01 % SOLN Place 1 drop into both eyes at bedtime.    .  carvedilol (COREG) 12.5 MG tablet Take 1 tablet by mouth twice daily 180 tablet 0  . cycloSPORINE (RESTASIS) 0.05 % ophthalmic emulsion Place 1 drop into both eyes at bedtime.    . hydrALAZINE (APRESOLINE) 25 MG tablet Take 1 tablet (25 mg total) by mouth 3 (three) times daily. 270 tablet 3  . levothyroxine (LEVOTHROID) 50 MCG tablet Take 50 mcg by mouth every other day.     . levothyroxine (SYNTHROID) 75 MCG tablet Take 75 mcg by mouth every other day. Alternates every other day with 60m    . losartan-hydrochlorothiazide (HYZAAR) 50-12.5 MG tablet Take 1 tablet by mouth daily. 30 tablet 6  . mirabegron ER (MYRBETRIQ) 25 MG TB24 tablet  Take 25 mg by mouth at bedtime.    . pantoprazole (PROTONIX) 40 MG tablet Take 1 tablet (40 mg total) by mouth 2 (two) times daily before a meal. 60 tablet 1  . rosuvastatin (CRESTOR) 5 MG tablet Take 10 mg by mouth daily.      . silodosin (RAPAFLO) 8 MG CAPS capsule Take 8 mg by mouth daily with breakfast.    . vitamin B-12 (CYANOCOBALAMIN) 500 MCG tablet Take 500 mcg by mouth daily.       No current facility-administered medications on file prior to visit.    Cardiac Studies:   Exercise myoview stress 12/29/2015: 1. The resting electrocardiogram demonstrated normal sinus rhythm, RBBB and no resting arrhythmias. Cannot exclude inferior infarct old. The stress electrocardiogram was normal. Patient exercised on Bruce protocol for 5.0 minutes and achieved 7.03 METS. Stress test terminated due to dyspnea and target heart rate( 99% MPHR). 2. Myocardial perfusion imaging is normal. Overall left ventricular systolic function was normal without regional wall motion abnormalities. The left ventricular ejection fraction was 55%  Carotid artery duplex 01/04/2017: Minimal stenosis in the bilateral internal carotid artery (1-15%) with homogenous plaque. Antegrade right vertebral artery flow. Antegrade left vertebral artery flow. Right carotid endarterectomy site is patent.  Compared to 01/06/2016, Stenosis in the left internal carotid artery (16-49%). not present.  Echocardiogram 10/29/2019: Left ventricle cavity is normal in size. Moderate concentric hypertrophy of the left ventricle. Normal LV systolic function with EF 55%. Normal global wall motion. Diastolic function not assessed to post-op status. Calculated EF 55%. Left atrial cavity is mildly dilated. Trileaflet aortic valve.  Moderate (Grade II) aortic regurgitation. S/p mitral valve repair. Mean PG 4 mmHg, MVA 1.4 cm2 by PHT method, suggesting mild mitral stenosis. Mild (Grade I) mitral regurgitation. Compared to previous study in 2017, mitral valve mean PG increased from 2 mmHg to 6 mmHg.   EKG:   EKG 04/07/2020: Sinus rhythm with first-degree AV block at rate of 62 bpm, left axis deviation, left anterior fascicular block.  Right bundle branch block.  Trifascicular block.  Poor R wave progression, cannot exclude anteroseptal infarct old.  Diffuse nonspecific T abnormality.  Single PVC and PAC.  EKG 02/10/2020: Atrial fibrillation with rapid ventricular response at rate of 95 bpm, left axis deviation, left intrafascicular block.  Right bundle branch block.  Poor R wave progression, cannot exclude anteroseptal infarct old.  EKG 10/01/2019: Sinus rhythm with first-degree block rate of 60 bpm, left axis deviation, left anterior fascicular block.  Right bundle branch block.  Trifascicular block. Probably progression, cannot exclude anteroseptal infarct old.  Nonspecific T abnormality.  No significant change from 03/30/2019.  Assessment     ICD-10-CM   1. Coronary artery disease involving native coronary artery of native heart without angina pectoris  I25.10 EKG 12-Lead    CANCELED: EKG 12-Lead  2. History of mitral valve repair  MV repair with implantation of 28 mm Edwards annuloplasty ring at [facility] [place] 10/08/2008  Z98.890   3. Primary hypertension  I10   4. Hypercholesteremia  E78.00    5. History of right-sided carotid endarterectomy  Z98.890 PCV CAROTID DUPLEX (BILATERAL)  6. Bilateral carotid bruits  R09.89 PCV CAROTID DUPLEX (BILATERAL)   CHA2DS2-VASc Score is 6.  Yearly risk of stroke: 49.8% (A, HTN, Vasc Dz and stroke).  Score of 1=0.6; 2=2.2; 3=3.2; 4=4.8; 5=7.2; 6=9.8; 7=>9.8) -(CHF; HTN; vasc disease DM,  Male = 1; Age <65 =0; 65-74 = 1,  >75 =2; stroke/embolism=  2).    No orders of the defined types were placed in this encounter. There are no discontinued medications.     Recommendations:   [name]  is a 85 y.o. with history of known coronary artery disease, history of 1 vessel CABG with LIMA to LAD and mitral valve repair done in [place], in May 2010, history of stroke due to symptomatic right carotid artery stenosis in September 2010, with right carotid endarterectomy in November 2010, hypertension, hyperlipidemia, trifascicular block.   Had community-acquired pneumonia on 02/10/2020 during hospital admission and also found to have melena and GI bleed on 02/25/2020 and has gastric ulcers, presently off of aspirin.  He also had a brief episode of atrial fibrillation with rapid ventricular response while he was admitted with pneumonia.    Fortunately he is maintaining sinus rhythm and does have underlying trifascicular block.  He remains asymptomatic and is tolerating moderate to high-grade dose of Coreg without any significant side effects.  Blood pressure is also relatively well controlled I did not make any changes today.  Advised him to have a low threshold to have EKG performed to evaluate for recurrence of atrial fibrillation as his cardioembolic risk is extremely high.  During hospitalization he was also found to have acute renal failure, as I do not have his latest BMP, I did not make any changes to his blood pressure medications.  He does have fairly prominent right carotid bruit, does have history of right carotid endarterectomy, I will repeat  carotid artery duplex as it has been greater than 3 to 4 years since prior carotid duplex.  His main complaint recently has been excessive sweating on his forehead and hairline and also in the groin, he is wondering whether it is related to pantoprazole.  Advised him to hold pantoprazole only for 1 week and take Pepcid for a week to see if his symptoms would improve and to immediately contact his GI doctor if it does improve otherwise need to go back on pantoprazole.  With regard to hyperlipidemia, LDL is not at goal.  Will increase Crestor to 10 mg daily, he will need lipid profile testing in 4 to 6 weeks or 2 months.  Otherwise stable from cardiac standpoint, I will see him back in 6 months for close monitoring.  I have reviewed all his medical records from the hospitalization and also his external labs.  This was a 40-minute encounter with addressing multiple medications reconciliation's and also evaluation of external labs and coordination of care.    Adrian Prows, MD, [facility] 04/07/2020, 2:20 PM Office: [phone] Pager: [phone]   CC: Hildred Laser, MD (GI) & Jerene Bears, MD

## 2020-04-30 DIAGNOSIS — H9192 Unspecified hearing loss, left ear: Secondary | ICD-10-CM | POA: Diagnosis not present

## 2020-04-30 DIAGNOSIS — I10 Essential (primary) hypertension: Secondary | ICD-10-CM | POA: Diagnosis not present

## 2020-04-30 DIAGNOSIS — Z299 Encounter for prophylactic measures, unspecified: Secondary | ICD-10-CM | POA: Diagnosis not present

## 2020-04-30 DIAGNOSIS — H6122 Impacted cerumen, left ear: Secondary | ICD-10-CM | POA: Diagnosis not present

## 2020-04-30 DIAGNOSIS — E039 Hypothyroidism, unspecified: Secondary | ICD-10-CM | POA: Diagnosis not present

## 2020-05-19 ENCOUNTER — Other Ambulatory Visit: Payer: Self-pay | Admitting: Cardiology

## 2020-05-19 DIAGNOSIS — I10 Essential (primary) hypertension: Secondary | ICD-10-CM

## 2020-05-20 DIAGNOSIS — Z23 Encounter for immunization: Secondary | ICD-10-CM | POA: Diagnosis not present

## 2020-05-28 DIAGNOSIS — N183 Chronic kidney disease, stage 3 unspecified: Secondary | ICD-10-CM | POA: Diagnosis not present

## 2020-05-28 DIAGNOSIS — I779 Disorder of arteries and arterioles, unspecified: Secondary | ICD-10-CM | POA: Diagnosis not present

## 2020-05-28 DIAGNOSIS — Z299 Encounter for prophylactic measures, unspecified: Secondary | ICD-10-CM | POA: Diagnosis not present

## 2020-05-28 DIAGNOSIS — I10 Essential (primary) hypertension: Secondary | ICD-10-CM | POA: Diagnosis not present

## 2020-05-28 DIAGNOSIS — I700 Atherosclerosis of aorta: Secondary | ICD-10-CM | POA: Diagnosis not present

## 2020-06-20 DIAGNOSIS — Z961 Presence of intraocular lens: Secondary | ICD-10-CM | POA: Diagnosis not present

## 2020-06-20 DIAGNOSIS — H04123 Dry eye syndrome of bilateral lacrimal glands: Secondary | ICD-10-CM | POA: Diagnosis not present

## 2020-06-20 DIAGNOSIS — H0102B Squamous blepharitis left eye, upper and lower eyelids: Secondary | ICD-10-CM | POA: Diagnosis not present

## 2020-06-20 DIAGNOSIS — H43811 Vitreous degeneration, right eye: Secondary | ICD-10-CM | POA: Diagnosis not present

## 2020-06-20 DIAGNOSIS — Z9889 Other specified postprocedural states: Secondary | ICD-10-CM | POA: Diagnosis not present

## 2020-06-20 DIAGNOSIS — H0102A Squamous blepharitis right eye, upper and lower eyelids: Secondary | ICD-10-CM | POA: Diagnosis not present

## 2020-06-20 DIAGNOSIS — H401131 Primary open-angle glaucoma, bilateral, mild stage: Secondary | ICD-10-CM | POA: Diagnosis not present

## 2020-06-20 DIAGNOSIS — H26492 Other secondary cataract, left eye: Secondary | ICD-10-CM | POA: Diagnosis not present

## 2020-07-01 ENCOUNTER — Ambulatory Visit (INDEPENDENT_AMBULATORY_CARE_PROVIDER_SITE_OTHER): Payer: Medicare Other | Admitting: Internal Medicine

## 2020-07-07 ENCOUNTER — Other Ambulatory Visit: Payer: Self-pay

## 2020-07-07 ENCOUNTER — Encounter (INDEPENDENT_AMBULATORY_CARE_PROVIDER_SITE_OTHER): Payer: Self-pay | Admitting: Internal Medicine

## 2020-07-07 ENCOUNTER — Telehealth (INDEPENDENT_AMBULATORY_CARE_PROVIDER_SITE_OTHER): Payer: Medicare Other | Admitting: Internal Medicine

## 2020-07-07 VITALS — Ht 66.0 in | Wt 140.0 lb

## 2020-07-07 DIAGNOSIS — R61 Generalized hyperhidrosis: Secondary | ICD-10-CM

## 2020-07-07 DIAGNOSIS — K269 Duodenal ulcer, unspecified as acute or chronic, without hemorrhage or perforation: Secondary | ICD-10-CM

## 2020-07-07 DIAGNOSIS — D649 Anemia, unspecified: Secondary | ICD-10-CM | POA: Diagnosis not present

## 2020-07-07 MED ORDER — PANTOPRAZOLE SODIUM 20 MG PO TBEC: 20.0000 mg | DELAYED_RELEASE_TABLET | Freq: Every day | ORAL | 3 refills | Status: DC

## 2020-07-07 NOTE — Progress Notes (Unsigned)
1 

## 2020-07-07 NOTE — Progress Notes (Unsigned)
Virtual Visit via Telephone Note  I connected with [place] on 07/07/20 at  3:29 PM EST by telephone and verified that I am speaking with the correct person using two identifiers.  Location: Patient: home Provider: office   I discussed the limitations, risks, security and privacy concerns of performing an evaluation and management service by telephone and the availability of in person appointments. I also discussed with the patient that there may be a patient responsible charge related to this service. The patient expressed understanding and agreed to proceed.   History of Present Illness:  This is patient's scheduled visit. Patient is 86 year old male with history of upper GI bleed secondary to duodenal ulcer who had face-to-face visit in October 2021.  H. pylori testing was negative. He was treated with full dose PPI and now he is on low-dose PPI. Patient states he is doing well.  His stamina has improved.  He feels he is almost back to his baseline.  His appetite is improved and he does not have fullness like he was having before.  He denies nausea vomiting heartburn bloating or abdominal pain.  He also denies melena or rectal bleeding.  He has gained 4 pounds since his last visit. He remains with nighttime sweating.  It generally occurs in his groin region and he has to change his clothes at night.  Previously he used to have generalized sweating.  He is not having any fever or chills.  He says only new medication that he was on was a PPI and all other medications he has been taking them for a while. He has not had any blood work since October.    Current Outpatient Medications:  .  amLODipine (NORVASC) 10 MG tablet, TAKE ONE TABLET BY MOUTH ONE TIME DAILY, Disp: 180 tablet, Rfl: 3 .  bimatoprost (LUMIGAN) 0.01 % SOLN, Place 1 drop into both eyes at bedtime., Disp: , Rfl:  .  carvedilol (COREG) 12.5 MG tablet, Take 1 tablet by mouth twice daily, Disp: 180 tablet, Rfl: 0 .   cycloSPORINE (RESTASIS) 0.05 % ophthalmic emulsion, Place 1 drop into both eyes at bedtime., Disp: , Rfl:  .  ferrous sulfate 325 (65 FE) MG EC tablet, Take 325 mg by mouth daily at 6 (six) AM., Disp: , Rfl:  .  hydrALAZINE (APRESOLINE) 25 MG tablet, Take 1 tablet (25 mg total) by mouth 3 (three) times daily. (Patient taking differently: Take 25 mg by mouth 2 (two) times daily.), Disp: 270 tablet, Rfl: 3 .  levothyroxine (SYNTHROID) 50 MCG tablet, Take 50 mcg by mouth every other day., Disp: , Rfl:  .  levothyroxine (SYNTHROID) 75 MCG tablet, Take 75 mcg by mouth every other day. Alternates every other day with 50mg , Disp: , Rfl:  .  losartan-hydrochlorothiazide (HYZAAR) 50-12.5 MG tablet, Take 1 tablet by mouth daily., Disp: 30 tablet, Rfl: 6 .  mirabegron ER (MYRBETRIQ) 25 MG TB24 tablet, Take 25 mg by mouth every other day., Disp: , Rfl:  .  pantoprazole (PROTONIX) 20 MG tablet, Take 1 tablet (20 mg total) by mouth daily before breakfast., Disp: 90 tablet, Rfl: 3 .  rosuvastatin (CRESTOR) 10 MG tablet, Take 10 mg by mouth daily., Disp: , Rfl:  .  silodosin (RAPAFLO) 8 MG CAPS capsule, Take 8 mg by mouth every other day. Patient alternates with Myrbetriq., Disp: , Rfl:  .  vitamin B-12 (CYANOCOBALAMIN) 500 MCG tablet, Take 500 mcg by mouth daily., Disp: , Rfl:   Observations/Objective:  Patient reported  his weight to be 140 pounds.  He weighed 136 pounds on 03/25/2020.  Assessment and Plan:  #1.  History of upper GI bleed secondary to duodenal ulcer.  He was treated with double dose PPI for several weeks and now he is on low-dose PPI.  He is at risk for recurrent disease and therefore should continue PPI independently.  #2.  Anemia secondary to upper GI bleed.  His hemoglobin has been slow to,.  He is due for H&H.  #3.  Profuse sweating.  Previously was generalized occurring at night and now it only involves groin area.  Not sure if it is due to medications are autonomic  dysfunction.  Follow Up Instructions:  Patient will go to Dr. Marcial Pacas office for H&H. No further recommendations regarding nocturnal sweating at this time. Office visit in 6 months.  I discussed the assessment and treatment plan with the patient. The patient was provided an opportunity to ask questions and all were answered. The patient agreed with the plan and demonstrated an understanding of the instructions.   The patient was advised to call back or seek an in-person evaluation if the symptoms worsen or if the condition fails to improve as anticipated.  I provided 15 minutes of non-face-to-face time during this encounter.   Hildred Laser, MD

## 2020-07-18 ENCOUNTER — Encounter (INDEPENDENT_AMBULATORY_CARE_PROVIDER_SITE_OTHER): Payer: Self-pay

## 2020-08-19 ENCOUNTER — Other Ambulatory Visit: Payer: Self-pay | Admitting: Cardiology

## 2020-08-19 DIAGNOSIS — I10 Essential (primary) hypertension: Secondary | ICD-10-CM

## 2020-09-11 DIAGNOSIS — I779 Disorder of arteries and arterioles, unspecified: Secondary | ICD-10-CM | POA: Diagnosis not present

## 2020-09-11 DIAGNOSIS — I700 Atherosclerosis of aorta: Secondary | ICD-10-CM | POA: Diagnosis not present

## 2020-09-11 DIAGNOSIS — I2510 Atherosclerotic heart disease of native coronary artery without angina pectoris: Secondary | ICD-10-CM | POA: Diagnosis not present

## 2020-09-11 DIAGNOSIS — Z299 Encounter for prophylactic measures, unspecified: Secondary | ICD-10-CM | POA: Diagnosis not present

## 2020-09-11 DIAGNOSIS — I10 Essential (primary) hypertension: Secondary | ICD-10-CM | POA: Diagnosis not present

## 2020-09-16 ENCOUNTER — Ambulatory Visit: Payer: Medicare Other

## 2020-09-16 ENCOUNTER — Other Ambulatory Visit: Payer: Self-pay

## 2020-09-16 DIAGNOSIS — R0989 Other specified symptoms and signs involving the circulatory and respiratory systems: Secondary | ICD-10-CM | POA: Diagnosis not present

## 2020-09-16 DIAGNOSIS — Z9889 Other specified postprocedural states: Secondary | ICD-10-CM

## 2020-10-06 ENCOUNTER — Other Ambulatory Visit: Payer: Self-pay

## 2020-10-06 ENCOUNTER — Encounter: Payer: Self-pay | Admitting: Cardiology

## 2020-10-06 ENCOUNTER — Ambulatory Visit: Payer: Medicare Other | Admitting: Cardiology

## 2020-10-06 VITALS — BP 140/60 | HR 54 | Temp 98.3°F | Resp 16 | Ht 66.0 in | Wt 140.0 lb

## 2020-10-06 DIAGNOSIS — I2510 Atherosclerotic heart disease of native coronary artery without angina pectoris: Secondary | ICD-10-CM

## 2020-10-06 DIAGNOSIS — D508 Other iron deficiency anemias: Secondary | ICD-10-CM

## 2020-10-06 DIAGNOSIS — Z951 Presence of aortocoronary bypass graft: Secondary | ICD-10-CM

## 2020-10-06 DIAGNOSIS — Z9889 Other specified postprocedural states: Secondary | ICD-10-CM | POA: Diagnosis not present

## 2020-10-06 DIAGNOSIS — I10 Essential (primary) hypertension: Secondary | ICD-10-CM | POA: Diagnosis not present

## 2020-10-06 DIAGNOSIS — I453 Trifascicular block: Secondary | ICD-10-CM | POA: Diagnosis not present

## 2020-10-06 MED ORDER — ASPIRIN 81 MG PO CHEW: 81.0000 mg | CHEWABLE_TABLET | Freq: Every day | ORAL | Status: DC

## 2020-10-06 NOTE — Progress Notes (Signed)
Primary Physician/Referring:  Glenda Chroman, MD  Patient ID: [name], male    DOB: 1935/02/06, 86 y.o.   MRN: [id number]  Chief Complaint  Patient presents with  . Hypertension  . Coronary Artery Disease  . Follow-up    6 month   HPI:    HPI: [name]  is a 86 y.o. with history of known coronary artery disease, history of 1 vessel CABG with LIMA to LAD and mitral valve repair done in [place], in May 2010, history of stroke due to symptomatic right carotid artery stenosis in September 2010, with right carotid endarterectomy in November 2010, hypertension, hyperlipidemia, trifascicular block.  Patient was admitted to the hospital on 02/10/2020 with community-acquired pneumonia and acute renal failure, amlodipine was discontinued due to orthostasis and low blood pressure.  He was also found to be in atrial fibrillation with rapid ventricular response and has had no recurrence since and hence not on anticoagulation.Marland Kitchen   He was evaluated for GI bleed and melena on 02/25/2020 and found to have peptic ulcer disease and presently on PPI.  Aspirin was discontinued.  He now presents to the office for 86-monthoffice visit.  He has no specific symptoms today.  He has noticed occasional ankle edema.  Past Medical History:  Diagnosis Date  . CVA (cerebral infarction)    Significant carotid artery disease status post right carotid endarterectomy, postoperatively  . Depression   . GERD (gastroesophageal reflux disease)   . Heart murmur   . Hemorrhoids   . Hernia   . History of BPH   . Hyperlipemia   . Hypertension   . Hypothyroidism   . Mitral regurgitation     [facility] [place] . Pneumonia   . Sleep apnea   . Status post coronary artery bypass grafting    Single-vessel coronary bypass grafting at time of mitral valve repair  . Stroke ([facility] 2010  . Thyroid disease    hypothyroidism   Past Surgical History:   Procedure Laterality Date  . BIOPSY  02/27/2020   Procedure: BIOPSY;  Surgeon: RRogene Houston MD;  [facility];  Service: Endoscopy;;  . CAROTID ENDARTERECTOMY Right Nov. 2010   CEA  . CATARACT EXTRACTION    . CHOLECYSTECTOMY    . COLONOSCOPY N/A 05/30/2013   Procedure: COLONOSCOPY;  Surgeon: NRogene Houston MD;  Location: [facility];  Service: Endoscopy;  Laterality: N/A;  1230-rescheduled to 12:00 Ann notified pt  . CORONARY ARTERY BYPASS GRAFT  2010  . ERCP     with biliary and pancreatic stenting  . ESOPHAGOGASTRODUODENOSCOPY (EGD) WITH PROPOFOL N/A 02/27/2020   Procedure: ESOPHAGOGASTRODUODENOSCOPY (EGD) WITH PROPOFOL;  Surgeon: RRogene Houston MD;  [facility];  Service: Endoscopy;  Laterality: N/A;  325  . INGUINAL HERNIA REPAIR  01/19/2011   Bilateral  . MITRAL VALVE REPAIR  April 2010  . MITRAL VALVE REPAIR  2010  . nevus removed    . TURP VAPORIZATION    . WART FULGURATION  01/03/2012   Procedure: FULGURATION ANAL WART;  Surgeon: BZenovia Jarred MD;  [place]  Service: General;  Laterality: N/A;  excision perianal condylomata   Social History   Tobacco Use  . Smoking status: Never Smoker  . Smokeless tobacco: Never Used  Substance Use Topics  . Alcohol use: No   Marital Status: Married   ROS  Review of Systems  Cardiovascular: Positive for leg swelling. Negative for chest pain and dyspnea on exertion.  Gastrointestinal: Negative for bloating and heartburn.   Objective  Blood pressure 140/60, pulse (!) 54, temperature 98.3 F (36.8 C), resp. rate 16, height _0  (1.676 m), weight 140 lb (63.5 kg), SpO2 98 %. Body mass index is 22.6 kg/m.   Vitals with BMI 10/06/2020 07/07/2020 04/07/2020  Height _1  _2  -  Weight 140 lbs 140 lbs -  BMI 32.95 18.84 -  Systolic 166 - 063  Diastolic 60 - 47  Pulse 54 - 65     Physical Exam Vitals reviewed.  Constitutional:      Appearance: He is well-developed.   Cardiovascular:     Rate and Rhythm: Normal rate and regular rhythm.     Pulses: Normal pulses and intact distal pulses.          Carotid pulses are on the right side with bruit and on the left side with bruit.    Heart sounds: Murmur heard.   Blowing midsystolic murmur is present at the upper right sternal border radiating to the apex.     Comments: Right carotid endarterectomy scar noted. Trace ankle edema.  No JVD, Pulmonary:     Effort: Pulmonary effort is normal. No accessory muscle usage or respiratory distress.     Breath sounds: Normal breath sounds.  Abdominal:     General: Bowel sounds are normal. There is abdominal bruit.     Palpations: Abdomen is soft. There is no pulsatile mass.  Musculoskeletal:        General: Normal range of motion.  Neurological:     Mental Status: He is alert and oriented to person, place, and time.    Radiology: No results found.  Laboratory examination:   External labs:  02/22/2020:  CBC  Hb 9.3/HCT 28.8, normal indicis, platelets 430.  Serum glucose 189, BUN 18, creatinine 1.49, EGFR 43/50 mL.  Cholesterol, total 128.000 M 01/07/2020 HDL 40.000 MG 01/07/2020 LDL-C 83.000 MG 12/25/2018 Triglycerides 102.000 M 01/07/2020  Hemoglobin 10.200 g/d 02/27/2020 Platelets 190.000 K/ 02/13/2020  Creatinine, Serum 1.180 mg/ 02/14/2020 Potassium 3.300 mm 02/14/2020 ALT (SGPT) 27.000 U/L 02/10/2020 TSH 3.410 01/07/2020  Cholesterol, total 141.000 M 12/25/2018 HDL 39.000 M 12/25/2018 LDL 83 Triglycerides 94.000 M 12/25/2018  A1C N/D TSH 3.350 12/25/2018  Hemoglobin 12.500 G/ 12/25/2018 Platelets 207.000 X 12/25/2018  Creatinine, Serum 1.580 MG/ 12/25/2018 Potassium 5.100 MM 01/26/2016 ALT (SGPT) 9.000 IU/L 12/25/2018  Medications   Current Outpatient Medications on File Prior to Visit  Medication Sig Dispense Refill  . amLODipine (NORVASC) 10 MG tablet TAKE ONE TABLET BY MOUTH ONE TIME DAILY 180 tablet 3  . bimatoprost (LUMIGAN) 0.01 % SOLN Place  1 drop into both eyes at bedtime.    . carvedilol (COREG) 12.5 MG tablet Take 1 tablet by mouth twice daily 180 tablet 0  . cycloSPORINE (RESTASIS) 0.05 % ophthalmic emulsion Place 1 drop into both eyes at bedtime.    . ferrous sulfate 325 (65 FE) MG EC tablet Take 325 mg by mouth daily at 6 (six) AM.    . hydrALAZINE (APRESOLINE) 25 MG tablet Take 1 tablet (25 mg total) by mouth 3 (three) times daily. 270 tablet 3  . levothyroxine (SYNTHROID) 50 MCG tablet Take 50 mcg by mouth every other day.    . levothyroxine (SYNTHROID) 75 MCG tablet Take 75 mcg by mouth every other day. Alternates every other day with 20m    .  losartan-hydrochlorothiazide (HYZAAR) 50-12.5 MG tablet Take 1 tablet by mouth daily. 30 tablet 6  . mirabegron ER (MYRBETRIQ) 25 MG TB24 tablet Take 25 mg by mouth every other day.    . pantoprazole (PROTONIX) 20 MG tablet Take 1 tablet (20 mg total) by mouth daily before breakfast. 90 tablet 3  . rosuvastatin (CRESTOR) 10 MG tablet Take 10 mg by mouth daily.    . silodosin (RAPAFLO) 8 MG CAPS capsule Take 8 mg by mouth every other day. Patient alternates with Myrbetriq.    Marland Kitchen vitamin B-12 (CYANOCOBALAMIN) 500 MCG tablet Take 500 mcg by mouth daily.     No current facility-administered medications on file prior to visit.    Cardiac Studies:   Exercise myoview stress 12/29/2015: 1. The resting electrocardiogram demonstrated normal sinus rhythm, RBBB and no resting arrhythmias. Cannot exclude inferior infarct old. The stress electrocardiogram was normal. Patient exercised on Bruce protocol for 5.0 minutes and achieved 7.03 METS. Stress test terminated due to dyspnea and target heart rate( 99% MPHR). 2. Myocardial perfusion imaging is normal. Overall left ventricular systolic function was normal without regional wall motion abnormalities. The left ventricular ejection fraction was 55%  Carotid artery duplex 09/16/2020: Minimal stenosis in the bilateral internal carotid artery (1-15%)  with homogenous plaque.  Bilateral external carotid artery are consistent with stenosis in the range of <50% Antegrade right vertebral artery flow. Antegrade left vertebral artery flow. Right carotid endarterectomy site is patent. No significant change from 01/04/2017. Follow up studies if clinically indicated.   Echocardiogram 10/29/2019: Left ventricle cavity is normal in size. Moderate concentric hypertrophy of the left ventricle. Normal LV systolic function with EF 55%. Normal global wall motion. Diastolic function not assessed to post-op status. Calculated EF 55%. Left atrial cavity is mildly dilated. Trileaflet aortic valve.  Moderate (Grade II) aortic regurgitation. S/p mitral valve repair. Mean PG 4 mmHg, MVA 1.4 cm2 by PHT method, suggesting mild mitral stenosis. Mild (Grade I) mitral regurgitation. Compared to previous study in 2017, mitral valve mean PG increased from 2 mmHg to 6 mmHg.   EKG:     EKG 10/06/2020: Sinus rhythm with first-degree AV block at rate of 60 bpm, left axis deviation, left anterior fascicular block.  Right bundle branch block.  Nonspecific T abnormality.  No significant change since EKG 04/07/2020.  EKG 02/10/2020: Atrial fibrillation with rapid ventricular response at rate of 95 bpm, left axis deviation, left intrafascicular block.  Right bundle branch block.  Poor R wave progression, cannot exclude anteroseptal infarct old. Review of EKG, poor baseline. Probably PACs.  EKG 10/01/2019: Sinus rhythm with first-degree block rate of 60 bpm, left axis deviation, left anterior fascicular block.  Right bundle branch block.  Trifascicular block. Poor R progression, cannot exclude anteroseptal infarct old.  Nonspecific T abnormality.  No significant change from 03/30/2019.  Assessment     ICD-10-CM   1. Coronary artery disease involving native coronary artery of native heart without angina pectoris  I25.10 EKG 12-Lead    aspirin (ASPIRIN CHILDRENS) 81 MG chewable  tablet  2. Status post coronary artery bypass grafting  Z95.1   3. History of mitral valve repair  MV repair with implantation of 28 mm Edwards annuloplasty ring at [facility], [place], [place] 10/08/2008  Z98.890   4. Primary hypertension  I10   5. Trifascicular block  I45.3   6. Essential hypertension  I10   7. Other iron deficiency anemia  D50.8    Meds ordered this encounter  Medications  .  aspirin (ASPIRIN CHILDRENS) 81 MG chewable tablet    Sig: Chew 1 tablet (81 mg total) by mouth daily.  There are no discontinued medications.     Recommendations:   [name]  is a 86 y.o. with history of known coronary artery disease, history of 1 vessel CABG with LIMA to LAD and mitral valve repair done in [place], in May 2010, history of stroke due to symptomatic right carotid artery stenosis in September 2010, with right carotid endarterectomy in November 2010, hypertension, hyperlipidemia, trifascicular block.   Had community-acquired pneumonia on 02/10/2020 during hospital admission and also found to have melena and GI bleed on 02/25/2020 and has gastric ulcers, presently off of aspirin.  He also had a brief episode of atrial fibrillation with rapid ventricular response while he was admitted with pneumonia with no recurrence.    Endoscopy on 02/27/2020 had revealed mild gastritis and nonbleeding duodenal ulcer.  Aspirin was held at the time.  Advised him to restart aspirin in view of known carotid disease and coronary disease.  He will need continued surveillance of his anemia and if iron levels are still low, consider iron transfusion as he has not been able to tolerate oral iron.  Otherwise he remains asymptomatic, no change in physical exam or heart murmur, carotid artery duplex reveals widely patent endarterectomy.  No further surveillance is needed for this.  I do not have his recent labs, he has an upcoming appointment with his PCP.  I will see him back in 6 months specifically to  follow-up on trifascicular block.  I have discussed with him regarding signs and symptoms of complete heart block.  Blood pressure is upper limit of normal however blood pressures at home has been completely well controlled.  Hence I did not make any changes to his medications.  Lipids being managed by PCP.  His mild ankle edema is related to amlodipine.   Adrian Prows, MD, [facility] 10/06/2020, 1:41 PM Office: [phone] Pager: [phone]

## 2020-10-20 DIAGNOSIS — Z9889 Other specified postprocedural states: Secondary | ICD-10-CM | POA: Diagnosis not present

## 2020-10-20 DIAGNOSIS — H0102A Squamous blepharitis right eye, upper and lower eyelids: Secondary | ICD-10-CM | POA: Diagnosis not present

## 2020-10-20 DIAGNOSIS — H43811 Vitreous degeneration, right eye: Secondary | ICD-10-CM | POA: Diagnosis not present

## 2020-10-20 DIAGNOSIS — H401131 Primary open-angle glaucoma, bilateral, mild stage: Secondary | ICD-10-CM | POA: Diagnosis not present

## 2020-10-20 DIAGNOSIS — H04123 Dry eye syndrome of bilateral lacrimal glands: Secondary | ICD-10-CM | POA: Diagnosis not present

## 2020-10-20 DIAGNOSIS — Z961 Presence of intraocular lens: Secondary | ICD-10-CM | POA: Diagnosis not present

## 2020-10-20 DIAGNOSIS — H26491 Other secondary cataract, right eye: Secondary | ICD-10-CM | POA: Diagnosis not present

## 2020-10-20 DIAGNOSIS — H0102B Squamous blepharitis left eye, upper and lower eyelids: Secondary | ICD-10-CM | POA: Diagnosis not present

## 2020-11-07 DIAGNOSIS — Z299 Encounter for prophylactic measures, unspecified: Secondary | ICD-10-CM | POA: Diagnosis not present

## 2020-11-07 DIAGNOSIS — J069 Acute upper respiratory infection, unspecified: Secondary | ICD-10-CM | POA: Diagnosis not present

## 2020-11-07 DIAGNOSIS — I10 Essential (primary) hypertension: Secondary | ICD-10-CM | POA: Diagnosis not present

## 2020-11-07 DIAGNOSIS — I779 Disorder of arteries and arterioles, unspecified: Secondary | ICD-10-CM | POA: Diagnosis not present

## 2020-11-07 DIAGNOSIS — I700 Atherosclerosis of aorta: Secondary | ICD-10-CM | POA: Diagnosis not present

## 2020-11-13 ENCOUNTER — Other Ambulatory Visit: Payer: Self-pay | Admitting: Cardiology

## 2020-11-13 DIAGNOSIS — I10 Essential (primary) hypertension: Secondary | ICD-10-CM

## 2020-11-19 ENCOUNTER — Other Ambulatory Visit: Payer: Self-pay | Admitting: Cardiology

## 2020-11-19 DIAGNOSIS — I10 Essential (primary) hypertension: Secondary | ICD-10-CM

## 2020-11-26 DIAGNOSIS — Z299 Encounter for prophylactic measures, unspecified: Secondary | ICD-10-CM | POA: Diagnosis not present

## 2020-11-26 DIAGNOSIS — H9190 Unspecified hearing loss, unspecified ear: Secondary | ICD-10-CM | POA: Diagnosis not present

## 2020-11-26 DIAGNOSIS — M79672 Pain in left foot: Secondary | ICD-10-CM | POA: Diagnosis not present

## 2020-11-26 DIAGNOSIS — R059 Cough, unspecified: Secondary | ICD-10-CM | POA: Diagnosis not present

## 2020-11-26 DIAGNOSIS — I10 Essential (primary) hypertension: Secondary | ICD-10-CM | POA: Diagnosis not present

## 2021-01-06 ENCOUNTER — Other Ambulatory Visit: Payer: Self-pay

## 2021-01-06 ENCOUNTER — Ambulatory Visit (INDEPENDENT_AMBULATORY_CARE_PROVIDER_SITE_OTHER): Payer: Medicare Other | Admitting: Internal Medicine

## 2021-01-06 ENCOUNTER — Encounter (INDEPENDENT_AMBULATORY_CARE_PROVIDER_SITE_OTHER): Payer: Self-pay | Admitting: Internal Medicine

## 2021-01-06 VITALS — BP 131/68 | HR 81 | Temp 98.9°F | Ht 66.0 in | Wt 135.5 lb

## 2021-01-06 DIAGNOSIS — Z8719 Personal history of other diseases of the digestive system: Secondary | ICD-10-CM

## 2021-01-06 DIAGNOSIS — D649 Anemia, unspecified: Secondary | ICD-10-CM

## 2021-01-06 DIAGNOSIS — R61 Generalized hyperhidrosis: Secondary | ICD-10-CM

## 2021-01-06 DIAGNOSIS — I2510 Atherosclerotic heart disease of native coronary artery without angina pectoris: Secondary | ICD-10-CM | POA: Diagnosis not present

## 2021-01-06 NOTE — Patient Instructions (Signed)
Please ask dr. Marcial Pacas office to send me copy of next blood

## 2021-01-06 NOTE — Progress Notes (Signed)
Presenting complaint;  Follow-up for peptic ulcer disease and anemia.  Database and subjective:  Patient is 86 year old male who is here for scheduled visit.  She was initially seen in September 2021 for melena and anemia.  This occurred following hospitalization for community-acquired pneumonia responding to antibiotic therapy.  Patient had been on low-dose aspirin.  He underwent esophagogastroduodenoscopy on 02/27/2020 and he was found to have bulbar ulcer.  Gastric biopsy was negative for H. pylori.  He subsequent also had serology and which was negative.  He has been maintained on PPI. His hemoglobin was 10.3 on 04/04/2020.  He had virtual visit on 07/07/2020 but he has not had any blood work.  He states he is continuing to feel better.  However he has lost 5 pounds since his last visit.  He states his baseline weight was around 145 pounds.  That is what he weighed before he was hospitalized with pneumonia in August 2021.  He states his appetite is fair.  He has a light breakfast and lunch and supper is his biggest meal.  He rarely has a snack.  He has eating activity at twice a week.  He denies abdominal pain nausea vomiting heartburn or dysphagia.  His bowels virtually move every day. He continues to complain of perspiration in inguinal region that he has to change his clothes when he wakes up in the morning.  He has not been able to pinpoint any triggers.  He is on Synthroid 50 mcg alternating with 75 mcg every other day.  He does not feel that he has perforation on days when he takes higher dose.  He also complains of pain in his right foot and he has not been able to walk.  He he knows he cannot take NSAIDs.  He is interested in seeing a podiatrist.  Current Medications: Outpatient Encounter Medications as of 01/06/2021  Medication Sig   amLODipine (NORVASC) 10 MG tablet TAKE ONE TABLET BY MOUTH ONE TIME DAILY   aspirin (ASPIRIN CHILDRENS) 81 MG chewable tablet Chew 1 tablet (81 mg total) by  mouth daily.   bimatoprost (LUMIGAN) 0.01 % SOLN Place 1 drop into both eyes at bedtime.   carvedilol (COREG) 12.5 MG tablet Take 1 tablet by mouth twice daily   cycloSPORINE (RESTASIS) 0.05 % ophthalmic emulsion Place 1 drop into both eyes at bedtime.   hydrALAZINE (APRESOLINE) 25 MG tablet TAKE 1 TABLET BY MOUTH THREE TIMES DAILY   levothyroxine (SYNTHROID) 50 MCG tablet Take 50 mcg by mouth every other day.   levothyroxine (SYNTHROID) 75 MCG tablet Take 75 mcg by mouth every other day. Alternates every other day with 45m   losartan-hydrochlorothiazide (HYZAAR) 50-12.5 MG tablet Take 1 tablet by mouth daily.   mirabegron ER (MYRBETRIQ) 25 MG TB24 tablet Take 25 mg by mouth every other day.   pantoprazole (PROTONIX) 20 MG tablet Take 1 tablet (20 mg total) by mouth daily before breakfast.   rosuvastatin (CRESTOR) 10 MG tablet Take 10 mg by mouth daily.   silodosin (RAPAFLO) 8 MG CAPS capsule Take 8 mg by mouth every other day. Patient alternates with Myrbetriq.   vitamin B-12 (CYANOCOBALAMIN) 500 MCG tablet Take 500 mcg by mouth daily.   [DISCONTINUED] ferrous sulfate 325 (65 FE) MG EC tablet Take 325 mg by mouth daily at 6 (six) AM.   No facility-administered encounter medications on file as of 01/06/2021.     Objective: Blood pressure 131/68, pulse 81, temperature 98.9 F (37.2 C), temperature source Oral, height 5'  6" (1.676 m), weight 135 lb 8 oz (61.5 kg). Patient is alert and in no acute distress. Is wearing a mask. Conjunctiva is pink. Sclera is nonicteric Oropharyngeal mucosa is normal. No neck masses or thyromegaly noted. Cardiac exam with regular rhythm normal S1 and S2. No murmur or gallop noted. Lungs are clear to auscultation. Abdomen symmetrical soft and nontender with organomegaly or masses. No LE edema or clubbing noted.  Labs/studies Results:   CBC Latest Ref Rng & Units 02/27/2020 02/13/2020 02/11/2020  WBC 4.0 - 10.5 K/uL - 6.9 10.6(H)  Hemoglobin 13.0 - 17.0 g/dL  10.2(L) 11.0(L) 12.3(L)  Hematocrit 39.0 - 52.0 % 31.7(L) 32.6(L) 36.8(L)  Platelets 150 - 400 K/uL - 190 148(L)    CMP Latest Ref Rng & Units 02/14/2020 02/13/2020 02/12/2020  Glucose 70 - 99 mg/dL 105(H) 109(H) 104(H)  BUN 8 - 23 mg/dL _0 Creatinine 0.61 - 1.24 mg/dL 1.18 1.33(H) 1.41(H)  Sodium 135 - 145 mmol/L 136 136 132(L)  Potassium 3.5 - 5.1 mmol/L 3.3(L) 3.4(L) 3.1(L)  Chloride 98 - 111 mmol/L 107 105 102  CO2 22 - 32 mmol/L 21(L) 20(L) 19(L)  Calcium 8.9 - 10.3 mg/dL 8.2(L) 8.3(L) 7.8(L)  Total Protein 6.5 - 8.1 g/dL - - -  Total Bilirubin 0.3 - 1.2 mg/dL - - -  Alkaline Phos 38 - 126 U/L - - -  AST 15 - 41 U/L - - -  ALT 0 - 44 U/L - - -    Hepatic Function Latest Ref Rng & Units 02/10/2020  Total Protein 6.5 - 8.1 g/dL 7.9  Albumin 3.5 - 5.0 g/dL 3.7  AST 15 - 41 U/L 43(H)  ALT 0 - 44 U/L 27  Alk Phosphatase 38 - 126 U/L 74  Total Bilirubin 0.3 - 1.2 mg/dL 0.8    H&H 10.3 and 32.5  on 04/04/2020.  Age pylori IgG antibody, IgA antibody and IgM antibodies were all negative.  In October 2021.  Assessment:  #1.  History of duodenal ulcer resulting in upper GI bleed in September 2021.  Gastric biopsy as well as serology negative for H. pylori.  He was issued treated with double dose PPI and now on low-dose PPI for maintenance.  His risk of recurrence is significant.  Therefore he should continue pantoprazole at current dose indefinitely.  #2.  History of anemia felt to be due to GI blood loss.  H&H had improved but not normal.  He is due for lab studies.  #3.  Nocturnal perspiration.  Etiology unclear.  I wonder if somewhat related to thyroid medication.  Plan:  Continue pantoprazole 20 mg p.o. every morning. Patient have CBC at the time of office visit with Dr. Woody Seller on 01/07/2021. Patient advised to eat a TV or other snacks daily. He will monitor his weight and call office if he is lost more than 5 pounds. Office visit in 1 year.

## 2021-01-08 DIAGNOSIS — R5383 Other fatigue: Secondary | ICD-10-CM | POA: Diagnosis not present

## 2021-01-08 DIAGNOSIS — Z1331 Encounter for screening for depression: Secondary | ICD-10-CM | POA: Diagnosis not present

## 2021-01-08 DIAGNOSIS — I10 Essential (primary) hypertension: Secondary | ICD-10-CM | POA: Diagnosis not present

## 2021-01-08 DIAGNOSIS — Z6820 Body mass index (BMI) 20.0-20.9, adult: Secondary | ICD-10-CM | POA: Diagnosis not present

## 2021-01-08 DIAGNOSIS — Z0000 Encounter for general adult medical examination without abnormal findings: Secondary | ICD-10-CM | POA: Diagnosis not present

## 2021-01-08 DIAGNOSIS — E039 Hypothyroidism, unspecified: Secondary | ICD-10-CM | POA: Diagnosis not present

## 2021-01-08 DIAGNOSIS — Z299 Encounter for prophylactic measures, unspecified: Secondary | ICD-10-CM | POA: Diagnosis not present

## 2021-01-08 DIAGNOSIS — Z125 Encounter for screening for malignant neoplasm of prostate: Secondary | ICD-10-CM | POA: Diagnosis not present

## 2021-01-08 DIAGNOSIS — Z79899 Other long term (current) drug therapy: Secondary | ICD-10-CM | POA: Diagnosis not present

## 2021-01-08 DIAGNOSIS — E7800 Pure hypercholesterolemia, unspecified: Secondary | ICD-10-CM | POA: Diagnosis not present

## 2021-01-08 DIAGNOSIS — Z7189 Other specified counseling: Secondary | ICD-10-CM | POA: Diagnosis not present

## 2021-01-08 DIAGNOSIS — Z1339 Encounter for screening examination for other mental health and behavioral disorders: Secondary | ICD-10-CM | POA: Diagnosis not present

## 2021-02-17 ENCOUNTER — Other Ambulatory Visit: Payer: Self-pay | Admitting: Cardiology

## 2021-02-17 DIAGNOSIS — I10 Essential (primary) hypertension: Secondary | ICD-10-CM

## 2021-02-24 ENCOUNTER — Other Ambulatory Visit: Payer: Self-pay | Admitting: Cardiology

## 2021-02-24 DIAGNOSIS — H26491 Other secondary cataract, right eye: Secondary | ICD-10-CM | POA: Diagnosis not present

## 2021-02-24 DIAGNOSIS — H0102A Squamous blepharitis right eye, upper and lower eyelids: Secondary | ICD-10-CM | POA: Diagnosis not present

## 2021-02-24 DIAGNOSIS — H04123 Dry eye syndrome of bilateral lacrimal glands: Secondary | ICD-10-CM | POA: Diagnosis not present

## 2021-02-24 DIAGNOSIS — I10 Essential (primary) hypertension: Secondary | ICD-10-CM

## 2021-02-24 DIAGNOSIS — Z961 Presence of intraocular lens: Secondary | ICD-10-CM | POA: Diagnosis not present

## 2021-02-24 DIAGNOSIS — H532 Diplopia: Secondary | ICD-10-CM | POA: Diagnosis not present

## 2021-02-24 DIAGNOSIS — H401130 Primary open-angle glaucoma, bilateral, stage unspecified: Secondary | ICD-10-CM | POA: Diagnosis not present

## 2021-02-24 DIAGNOSIS — Z9889 Other specified postprocedural states: Secondary | ICD-10-CM | POA: Diagnosis not present

## 2021-02-24 DIAGNOSIS — H0102B Squamous blepharitis left eye, upper and lower eyelids: Secondary | ICD-10-CM | POA: Diagnosis not present

## 2021-02-24 DIAGNOSIS — H43811 Vitreous degeneration, right eye: Secondary | ICD-10-CM | POA: Diagnosis not present

## 2021-02-25 ENCOUNTER — Other Ambulatory Visit: Payer: Self-pay | Admitting: Cardiology

## 2021-02-25 DIAGNOSIS — I10 Essential (primary) hypertension: Secondary | ICD-10-CM

## 2021-02-27 DIAGNOSIS — I10 Essential (primary) hypertension: Secondary | ICD-10-CM | POA: Diagnosis not present

## 2021-02-27 DIAGNOSIS — Z299 Encounter for prophylactic measures, unspecified: Secondary | ICD-10-CM | POA: Diagnosis not present

## 2021-02-27 DIAGNOSIS — M7732 Calcaneal spur, left foot: Secondary | ICD-10-CM | POA: Diagnosis not present

## 2021-03-06 DIAGNOSIS — M1712 Unilateral primary osteoarthritis, left knee: Secondary | ICD-10-CM | POA: Diagnosis not present

## 2021-03-06 DIAGNOSIS — I10 Essential (primary) hypertension: Secondary | ICD-10-CM | POA: Diagnosis not present

## 2021-03-06 DIAGNOSIS — Z6820 Body mass index (BMI) 20.0-20.9, adult: Secondary | ICD-10-CM | POA: Diagnosis not present

## 2021-03-06 DIAGNOSIS — M170 Bilateral primary osteoarthritis of knee: Secondary | ICD-10-CM | POA: Diagnosis not present

## 2021-03-06 DIAGNOSIS — Z299 Encounter for prophylactic measures, unspecified: Secondary | ICD-10-CM | POA: Diagnosis not present

## 2021-03-30 DIAGNOSIS — M1711 Unilateral primary osteoarthritis, right knee: Secondary | ICD-10-CM | POA: Diagnosis not present

## 2021-04-01 DIAGNOSIS — M7752 Other enthesopathy of left foot: Secondary | ICD-10-CM | POA: Diagnosis not present

## 2021-04-01 DIAGNOSIS — Z299 Encounter for prophylactic measures, unspecified: Secondary | ICD-10-CM | POA: Diagnosis not present

## 2021-04-03 DIAGNOSIS — M1712 Unilateral primary osteoarthritis, left knee: Secondary | ICD-10-CM | POA: Diagnosis not present

## 2021-04-06 DIAGNOSIS — M1711 Unilateral primary osteoarthritis, right knee: Secondary | ICD-10-CM | POA: Diagnosis not present

## 2021-04-07 ENCOUNTER — Other Ambulatory Visit: Payer: Self-pay

## 2021-04-07 ENCOUNTER — Ambulatory Visit: Payer: Medicare Other | Admitting: Cardiology

## 2021-04-07 ENCOUNTER — Encounter: Payer: Self-pay | Admitting: Cardiology

## 2021-04-07 VITALS — BP 128/54 | HR 65 | Temp 99.0°F | Resp 17 | Ht 66.0 in | Wt 135.0 lb

## 2021-04-07 DIAGNOSIS — E7800 Pure hypercholesterolemia, unspecified: Secondary | ICD-10-CM

## 2021-04-07 DIAGNOSIS — I10 Essential (primary) hypertension: Secondary | ICD-10-CM | POA: Diagnosis not present

## 2021-04-07 DIAGNOSIS — I2510 Atherosclerotic heart disease of native coronary artery without angina pectoris: Secondary | ICD-10-CM | POA: Diagnosis not present

## 2021-04-07 DIAGNOSIS — Z9889 Other specified postprocedural states: Secondary | ICD-10-CM | POA: Diagnosis not present

## 2021-04-07 MED ORDER — ROSUVASTATIN CALCIUM 20 MG PO TABS: 20.0000 mg | ORAL_TABLET | Freq: Every day | ORAL | 3 refills | Status: DC

## 2021-04-07 NOTE — Progress Notes (Signed)
Primary Physician/Referring:  Henry Chroman, MD  Patient ID: [name], male    DOB: 1935/04/25, 86 y.o.   MRN: [id number]  Chief Complaint  Patient presents with   Coronary Artery Disease   Hypertension   HEART BLOCK    6 MONTHS   HPI:    HPI: [name]  is a 86 y.o. with history of known coronary artery disease, history of 1 vessel CABG with LIMA to LAD and mitral valve repair done in [place], in May 2010, history of stroke due to symptomatic right carotid artery stenosis in September 2010, with right carotid endarterectomy in November 2010, hypertension, hyperlipidemia, trifascicular block.  After an episode of GI bleed in September 2021, aspirin was discontinued.  He presently remains asymptomatic without any chest pain, dyspnea or leg edema.  His main concern is arthritis in his left knee joint.  He is now back on aspirin and is tolerating this without any side effects.  Past Medical History:  Diagnosis Date   CVA (cerebral infarction)    Significant carotid artery disease status post right carotid endarterectomy, postoperatively   Depression    GERD (gastroesophageal reflux disease)    Heart murmur    Hemorrhoids    Hernia    History of BPH    Hyperlipemia    Hypertension    Hypothyroidism    Mitral regurgitation     status post mitral valve repair at [facility] in [facility]   Pneumonia    Sleep apnea    Status post coronary artery bypass grafting    Single-vessel coronary bypass grafting at time of mitral valve repair   Stroke [facility]) Oct 2010   Thyroid disease    hypothyroidism   Past Surgical History:  Procedure Laterality Date   BIOPSY  02/27/2020   Procedure: BIOPSY;  Surgeon: Rogene Houston, MD;  [facility];  Service: Endoscopy;;   CAROTID ENDARTERECTOMY Right Nov. 2010   CEA   CATARACT EXTRACTION     CHOLECYSTECTOMY     COLONOSCOPY N/A 05/30/2013   Procedure: COLONOSCOPY;  Surgeon: Rogene Houston, MD;   [facility];  Service: Endoscopy;  Laterality: N/A;  1230-rescheduled to 12:00 Ann notified pt   CORONARY ARTERY BYPASS GRAFT  2010   ERCP     with biliary and pancreatic stenting   ESOPHAGOGASTRODUODENOSCOPY (EGD) WITH PROPOFOL N/A 02/27/2020   Procedure: ESOPHAGOGASTRODUODENOSCOPY (EGD) WITH PROPOFOL;  Surgeon: Rogene Houston, MD;  [facility];  Service: Endoscopy;  Laterality: N/A;  [place]  01/19/2011   Bilateral   MITRAL VALVE REPAIR  April 2010   MITRAL VALVE REPAIR  2010   nevus removed     TURP VAPORIZATION     WART FULGURATION  01/03/2012   Procedure: FULGURATION ANAL WART;  Surgeon: Zenovia Jarred, MD;  Location: [place];  Service: General;  Laterality: N/A;  excision perianal condylomata   Social History   Tobacco Use   Smoking status: Never   Smokeless tobacco: Never  Substance Use Topics   Alcohol use: No   Marital Status: Married   ROS  Review of Systems  Cardiovascular:  Negative for chest pain, dyspnea on exertion and leg swelling.  Musculoskeletal:  Positive for arthritis.  Gastrointestinal:  Negative for bloating and heartburn.  Objective  Blood pressure (!) 128/54, pulse 65, temperature 99 F (37.2 C), temperature source Temporal, resp. rate 17, height 5'  6" (1.676 m), weight 135 lb (61.2 kg), SpO2 99 %. Body mass index is 21.79 kg/m.   Vitals with BMI 04/07/2021 01/06/2021 10/06/2020  Height '5\' 6"'  '5\' 6"'  '5\' 6"'   Weight 135 lbs 135 lbs 8 oz 140 lbs  BMI 21.8 83.25 49.82  Systolic 641 583 094  Diastolic 54 68 60  Pulse 65 81 54     Physical Exam Vitals reviewed.  Constitutional:      Appearance: He is well-developed.  Neck:     Vascular: Carotid bruit (Bilateral. Right carotid endarterectomy scar noted.) and JVD present.  Cardiovascular:     Rate and Rhythm: Normal rate and regular rhythm.     Pulses: Normal pulses and intact distal pulses.     Heart sounds: Murmur heard.  Blowing  midsystolic murmur is present at the upper right sternal border radiating to the apex.  Pulmonary:     Effort: Pulmonary effort is normal. No accessory muscle usage or respiratory distress.     Breath sounds: Normal breath sounds.  Abdominal:     General: Bowel sounds are normal. There is abdominal bruit.     Palpations: Abdomen is soft. There is no pulsatile mass.  Musculoskeletal:     Right lower leg: No edema.     Left lower leg: No edema.   Radiology: No results found.  Laboratory examination:   External labs:  Cholesterol, total 117.000 M 01/08/2021 HDL 35.000 MG 01/08/2021 LDL 83.000 MG 12/25/2018 Triglycerides 89.000 MG 01/08/2021  Hemoglobin 12.000 G/ 01/08/2021 Platelets 234.000 X1 01/08/2021  Creatinine, Serum 1.480 MG/ 01/08/2021 ALT (SGPT) 8.000 IU/L 01/08/2021  TSH 1.360 01/08/2021  02/22/2020:  CBC  Hb 9.3/HCT 28.8, normal indicis, platelets 430.  Serum glucose 189, BUN 18, creatinine 1.49, EGFR 43/50 mL.  Cholesterol, total 128.000 M 01/07/2020 HDL 40.000 MG 01/07/2020 LDL-C 83.000 MG 12/25/2018 Triglycerides 102.000 M 01/07/2020  Hemoglobin 10.200 g/d 02/27/2020 Platelets 190.000 K/ 02/13/2020  Creatinine, Serum 1.180 mg/ 02/14/2020 Potassium 3.300 mm 02/14/2020 ALT (SGPT) 27.000 U/L 02/10/2020 TSH 3.410 01/07/2020  Cholesterol, total 141.000 M 12/25/2018 HDL 39.000 M 12/25/2018 LDL 83 Triglycerides 94.000 M 12/25/2018  A1C N/D TSH 3.350 12/25/2018  Hemoglobin 12.500 G/ 12/25/2018 Platelets 207.000 X 12/25/2018  Creatinine, Serum 1.580 MG/ 12/25/2018 Potassium 5.100 MM 01/26/2016 ALT (SGPT) 9.000 IU/L 12/25/2018  Medications   Current Outpatient Medications on File Prior to Visit  Medication Sig Dispense Refill   amLODipine (NORVASC) 10 MG tablet TAKE ONE TABLET BY MOUTH ONE TIME DAILY 180 tablet 3   aspirin (ASPIRIN CHILDRENS) 81 MG chewable tablet Chew 1 tablet (81 mg total) by mouth daily.     bimatoprost (LUMIGAN) 0.01 % SOLN Place 1 drop into both eyes at  bedtime.     carvedilol (COREG) 12.5 MG tablet Take 1 tablet by mouth twice daily 180 tablet 0   cycloSPORINE (RESTASIS) 0.05 % ophthalmic emulsion Place 1 drop into both eyes at bedtime.     hydrALAZINE (APRESOLINE) 25 MG tablet TAKE 1 TABLET BY MOUTH THREE TIMES DAILY 270 tablet 2   levothyroxine (SYNTHROID) 50 MCG tablet Take 50 mcg by mouth every other day.     levothyroxine (SYNTHROID) 75 MCG tablet Take 75 mcg by mouth every other day. Alternates every other day with 26m     losartan-hydrochlorothiazide (HYZAAR) 50-12.5 MG tablet Take 1 tablet by mouth daily. 30 tablet 6   mirabegron ER (MYRBETRIQ) 25 MG TB24 tablet Take 25 mg by mouth every other day.     pantoprazole (PROTONIX) 20  MG tablet Take 1 tablet (20 mg total) by mouth daily before breakfast. 90 tablet 3   silodosin (RAPAFLO) 8 MG CAPS capsule Take 8 mg by mouth every other day. Patient alternates with Myrbetriq.     vitamin B-12 (CYANOCOBALAMIN) 500 MCG tablet Take 500 mcg by mouth daily.     No current facility-administered medications on file prior to visit.    Cardiac Studies:   Exercise myoview stress 12/29/2015: 1. The resting electrocardiogram demonstrated normal sinus rhythm, RBBB and no resting arrhythmias. Cannot exclude inferior infarct old. The stress electrocardiogram was normal. Patient exercised on Bruce protocol for 5.0 minutes and achieved 7.03 METS. Stress test terminated due to dyspnea and target heart rate( 99% MPHR). 2. Myocardial perfusion imaging is normal. Overall left ventricular systolic function was normal without regional wall motion abnormalities. The left ventricular ejection fraction was 55%  Carotid artery duplex 09/16/2020: Minimal stenosis in the bilateral internal carotid artery (1-15%) with homogenous plaque.  Bilateral external carotid artery are consistent with stenosis in the range of <50% Antegrade right vertebral artery flow. Antegrade left vertebral artery flow. Right carotid  endarterectomy site is patent. No significant change from 01/04/2017. Follow up studies if clinically indicated.   Echocardiogram 10/29/2019: Left ventricle cavity is normal in size. Moderate concentric hypertrophy of the left ventricle. Normal LV systolic function with EF 55%. Normal global wall motion. Diastolic function not assessed to post-op status. Calculated EF 55%. Left atrial cavity is mildly dilated. Trileaflet aortic valve.  Moderate (Grade II) aortic regurgitation. S/p mitral valve repair. Mean PG 4 mmHg, MVA 1.4 cm2 by PHT method, suggesting mild mitral stenosis. Mild (Grade I) mitral regurgitation. Compared to previous study in 2017, mitral valve mean PG increased from 2 mmHg to 6 mmHg.   EKG:   EKG 04/07/2021: Sinus bradycardia at rate of 59 bpm, left axis deviation, left anterior fascicular block.  Right bundle branch block.  Nonspecific T abnormality.  Bifascicular block.  No significant change from 10/06/2020.   EKG 02/10/2020: Atrial fibrillation with rapid ventricular response at rate of 95 bpm, left axis deviation, left intrafascicular block.  Right bundle branch block.  Poor R wave progression, cannot exclude anteroseptal infarct old. Review of EKG, poor baseline. Probably PACs.   Assessment     ICD-10-CM   1. Essential hypertension  I10 EKG 12-Lead    2. Coronary artery disease involving native coronary artery of native heart without angina pectoris  I25.10     3. History of mitral valve repair  MV repair with implantation of 28 mm Edwards annuloplasty ring at [facility], [place], [place] 10/08/2008  Z98.890     4. Hypercholesteremia  E78.00 rosuvastatin (CRESTOR) 20 MG tablet    Lipid Panel With LDL/HDL Ratio    5. History of right-sided carotid endarterectomy  Z98.890      Meds ordered this encounter  Medications   rosuvastatin (CRESTOR) 20 MG tablet    Sig: Take 1 tablet (20 mg total) by mouth daily.    Dispense:  90 tablet    Refill:  3   Medications  Discontinued During This Encounter  Medication Reason   rosuvastatin (CRESTOR) 10 MG tablet Reorder       Recommendations:   [name]  is a 86 y.o. with history of known coronary artery disease, history of 1 vessel CABG with LIMA to LAD and mitral valve repair done in [place], in May 2010, history of stroke due to symptomatic right carotid artery stenosis in  September 2010, with right carotid endarterectomy in November 2010, hypertension, hyperlipidemia, trifascicular block.   He is here on annual visit and follow-up of coronary disease, mitral valve repair.  Remains asymptomatic.  No change in his physical exam.  He does have prominent carotid artery bruit but recent examination does not reveal any restenosis in his carotid endarterectomy site.  Blood pressures well controlled, I reviewed his labs, LDL is not at goal, in view of carotid artery disease and bypass surgery, coronary bypass, will increase Crestor to 20 mg daily.  If he has any side effects we can add Zetia.  He would like to have labs done by his PCP, lab request has been printed for the patient.  He will do this in 2 to 3 months.  Otherwise he remains stable from cardiac standpoint without recurrence of angina pectoris no change in his EKG, I will see him back in 1 year for follow-up   Henry Prows, MD, [facility] 04/07/2021, 4:23 PM Office: [phone] Pager: [phone]

## 2021-04-08 DIAGNOSIS — Z23 Encounter for immunization: Secondary | ICD-10-CM | POA: Diagnosis not present

## 2021-04-09 DIAGNOSIS — M1712 Unilateral primary osteoarthritis, left knee: Secondary | ICD-10-CM | POA: Diagnosis not present

## 2021-04-13 DIAGNOSIS — M1711 Unilateral primary osteoarthritis, right knee: Secondary | ICD-10-CM | POA: Diagnosis not present

## 2021-04-17 DIAGNOSIS — M1712 Unilateral primary osteoarthritis, left knee: Secondary | ICD-10-CM | POA: Diagnosis not present

## 2021-04-20 DIAGNOSIS — M1711 Unilateral primary osteoarthritis, right knee: Secondary | ICD-10-CM | POA: Diagnosis not present

## 2021-04-21 DIAGNOSIS — Z23 Encounter for immunization: Secondary | ICD-10-CM | POA: Diagnosis not present

## 2021-04-23 DIAGNOSIS — M1712 Unilateral primary osteoarthritis, left knee: Secondary | ICD-10-CM | POA: Diagnosis not present

## 2021-04-27 DIAGNOSIS — M1711 Unilateral primary osteoarthritis, right knee: Secondary | ICD-10-CM | POA: Diagnosis not present

## 2021-05-01 DIAGNOSIS — M1712 Unilateral primary osteoarthritis, left knee: Secondary | ICD-10-CM | POA: Diagnosis not present

## 2021-05-04 DIAGNOSIS — M1711 Unilateral primary osteoarthritis, right knee: Secondary | ICD-10-CM | POA: Diagnosis not present

## 2021-05-11 DIAGNOSIS — M1712 Unilateral primary osteoarthritis, left knee: Secondary | ICD-10-CM | POA: Diagnosis not present

## 2021-05-22 ENCOUNTER — Other Ambulatory Visit: Payer: Self-pay | Admitting: Cardiology

## 2021-05-22 DIAGNOSIS — I10 Essential (primary) hypertension: Secondary | ICD-10-CM

## 2021-06-16 DIAGNOSIS — E7800 Pure hypercholesterolemia, unspecified: Secondary | ICD-10-CM | POA: Diagnosis not present

## 2021-06-17 LAB — LIPID PANEL WITH LDL/HDL RATIO
Cholesterol, Total: 113 mg/dL (ref 100–199)
HDL: 34 mg/dL — ABNORMAL LOW (ref >39)
LDL Chol Calc (NIH): 59 mg/dL (ref 0–99)
LDL/HDL Ratio: 1.7 ratio (ref 0.0–3.6)
Triglycerides: 109 mg/dL (ref 0–149)
VLDL Cholesterol Cal: 20 mg/dL (ref 5–40)

## 2021-06-29 DIAGNOSIS — R351 Nocturia: Secondary | ICD-10-CM | POA: Diagnosis not present

## 2021-06-29 DIAGNOSIS — N401 Enlarged prostate with lower urinary tract symptoms: Secondary | ICD-10-CM | POA: Diagnosis not present

## 2021-06-29 DIAGNOSIS — R3912 Poor urinary stream: Secondary | ICD-10-CM | POA: Diagnosis not present

## 2021-06-29 DIAGNOSIS — R3915 Urgency of urination: Secondary | ICD-10-CM | POA: Diagnosis not present

## 2021-07-01 DIAGNOSIS — Z961 Presence of intraocular lens: Secondary | ICD-10-CM | POA: Diagnosis not present

## 2021-07-01 DIAGNOSIS — H26491 Other secondary cataract, right eye: Secondary | ICD-10-CM | POA: Diagnosis not present

## 2021-07-01 DIAGNOSIS — H0102B Squamous blepharitis left eye, upper and lower eyelids: Secondary | ICD-10-CM | POA: Diagnosis not present

## 2021-07-01 DIAGNOSIS — H401131 Primary open-angle glaucoma, bilateral, mild stage: Secondary | ICD-10-CM | POA: Diagnosis not present

## 2021-07-01 DIAGNOSIS — H0102A Squamous blepharitis right eye, upper and lower eyelids: Secondary | ICD-10-CM | POA: Diagnosis not present

## 2021-07-01 DIAGNOSIS — H532 Diplopia: Secondary | ICD-10-CM | POA: Diagnosis not present

## 2021-07-01 DIAGNOSIS — H43811 Vitreous degeneration, right eye: Secondary | ICD-10-CM | POA: Diagnosis not present

## 2021-07-01 DIAGNOSIS — H04123 Dry eye syndrome of bilateral lacrimal glands: Secondary | ICD-10-CM | POA: Diagnosis not present

## 2021-08-14 ENCOUNTER — Other Ambulatory Visit: Payer: Self-pay | Admitting: Cardiology

## 2021-08-14 DIAGNOSIS — I10 Essential (primary) hypertension: Secondary | ICD-10-CM

## 2021-08-26 ENCOUNTER — Other Ambulatory Visit (INDEPENDENT_AMBULATORY_CARE_PROVIDER_SITE_OTHER): Payer: Self-pay | Admitting: Internal Medicine

## 2021-09-04 ENCOUNTER — Other Ambulatory Visit: Payer: Self-pay | Admitting: Cardiology

## 2021-09-04 DIAGNOSIS — I10 Essential (primary) hypertension: Secondary | ICD-10-CM

## 2021-10-15 DIAGNOSIS — Z299 Encounter for prophylactic measures, unspecified: Secondary | ICD-10-CM | POA: Diagnosis not present

## 2021-10-15 DIAGNOSIS — H9190 Unspecified hearing loss, unspecified ear: Secondary | ICD-10-CM | POA: Diagnosis not present

## 2021-10-15 DIAGNOSIS — I10 Essential (primary) hypertension: Secondary | ICD-10-CM | POA: Diagnosis not present

## 2021-10-15 DIAGNOSIS — Z789 Other specified health status: Secondary | ICD-10-CM | POA: Diagnosis not present

## 2021-10-15 DIAGNOSIS — K5900 Constipation, unspecified: Secondary | ICD-10-CM | POA: Diagnosis not present

## 2021-11-03 DIAGNOSIS — H0102A Squamous blepharitis right eye, upper and lower eyelids: Secondary | ICD-10-CM | POA: Diagnosis not present

## 2021-11-03 DIAGNOSIS — H43811 Vitreous degeneration, right eye: Secondary | ICD-10-CM | POA: Diagnosis not present

## 2021-11-03 DIAGNOSIS — Z961 Presence of intraocular lens: Secondary | ICD-10-CM | POA: Diagnosis not present

## 2021-11-03 DIAGNOSIS — H532 Diplopia: Secondary | ICD-10-CM | POA: Diagnosis not present

## 2021-11-03 DIAGNOSIS — H0102B Squamous blepharitis left eye, upper and lower eyelids: Secondary | ICD-10-CM | POA: Diagnosis not present

## 2021-11-03 DIAGNOSIS — H04123 Dry eye syndrome of bilateral lacrimal glands: Secondary | ICD-10-CM | POA: Diagnosis not present

## 2021-11-03 DIAGNOSIS — H401131 Primary open-angle glaucoma, bilateral, mild stage: Secondary | ICD-10-CM | POA: Diagnosis not present

## 2021-11-03 DIAGNOSIS — H26491 Other secondary cataract, right eye: Secondary | ICD-10-CM | POA: Diagnosis not present

## 2021-11-18 ENCOUNTER — Other Ambulatory Visit: Payer: Self-pay | Admitting: Cardiology

## 2021-11-18 DIAGNOSIS — I10 Essential (primary) hypertension: Secondary | ICD-10-CM

## 2021-11-23 DIAGNOSIS — Z299 Encounter for prophylactic measures, unspecified: Secondary | ICD-10-CM | POA: Diagnosis not present

## 2021-11-23 DIAGNOSIS — I10 Essential (primary) hypertension: Secondary | ICD-10-CM | POA: Diagnosis not present

## 2021-11-23 DIAGNOSIS — R350 Frequency of micturition: Secondary | ICD-10-CM | POA: Diagnosis not present

## 2021-11-23 DIAGNOSIS — Z789 Other specified health status: Secondary | ICD-10-CM | POA: Diagnosis not present

## 2021-11-23 DIAGNOSIS — N390 Urinary tract infection, site not specified: Secondary | ICD-10-CM | POA: Diagnosis not present

## 2021-11-24 ENCOUNTER — Other Ambulatory Visit: Payer: Self-pay | Admitting: Cardiology

## 2021-11-24 ENCOUNTER — Other Ambulatory Visit (INDEPENDENT_AMBULATORY_CARE_PROVIDER_SITE_OTHER): Payer: Self-pay | Admitting: Internal Medicine

## 2021-11-24 DIAGNOSIS — I10 Essential (primary) hypertension: Secondary | ICD-10-CM

## 2021-12-26 IMAGING — CR DG CHEST 2V
2 series · 2 of 2 positions shown · non-contrast
Comparison: January 06, 2011

CLINICAL DATA: Fever.

EXAM:
CHEST - 2 VIEW

[w chest pa]
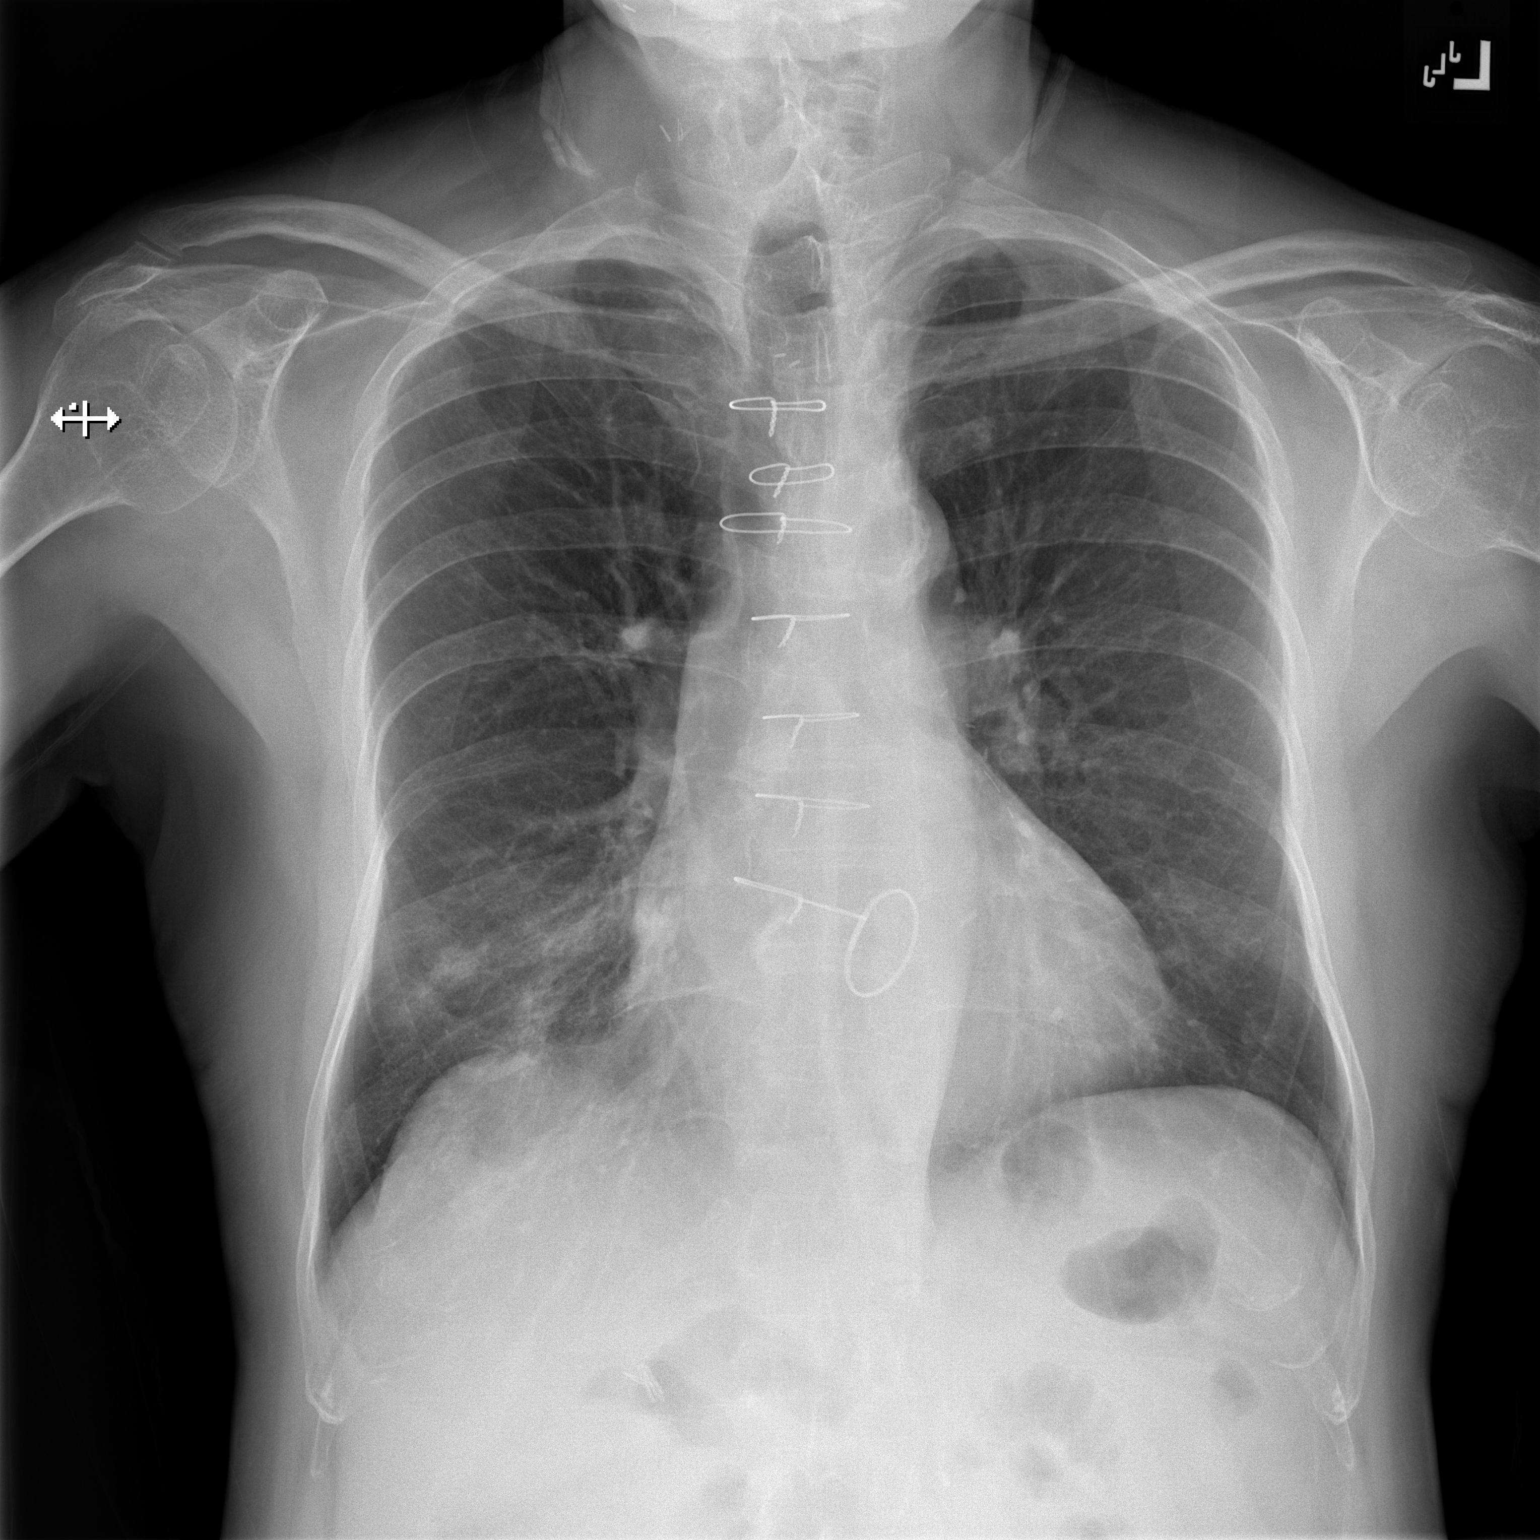

[w chest lat]
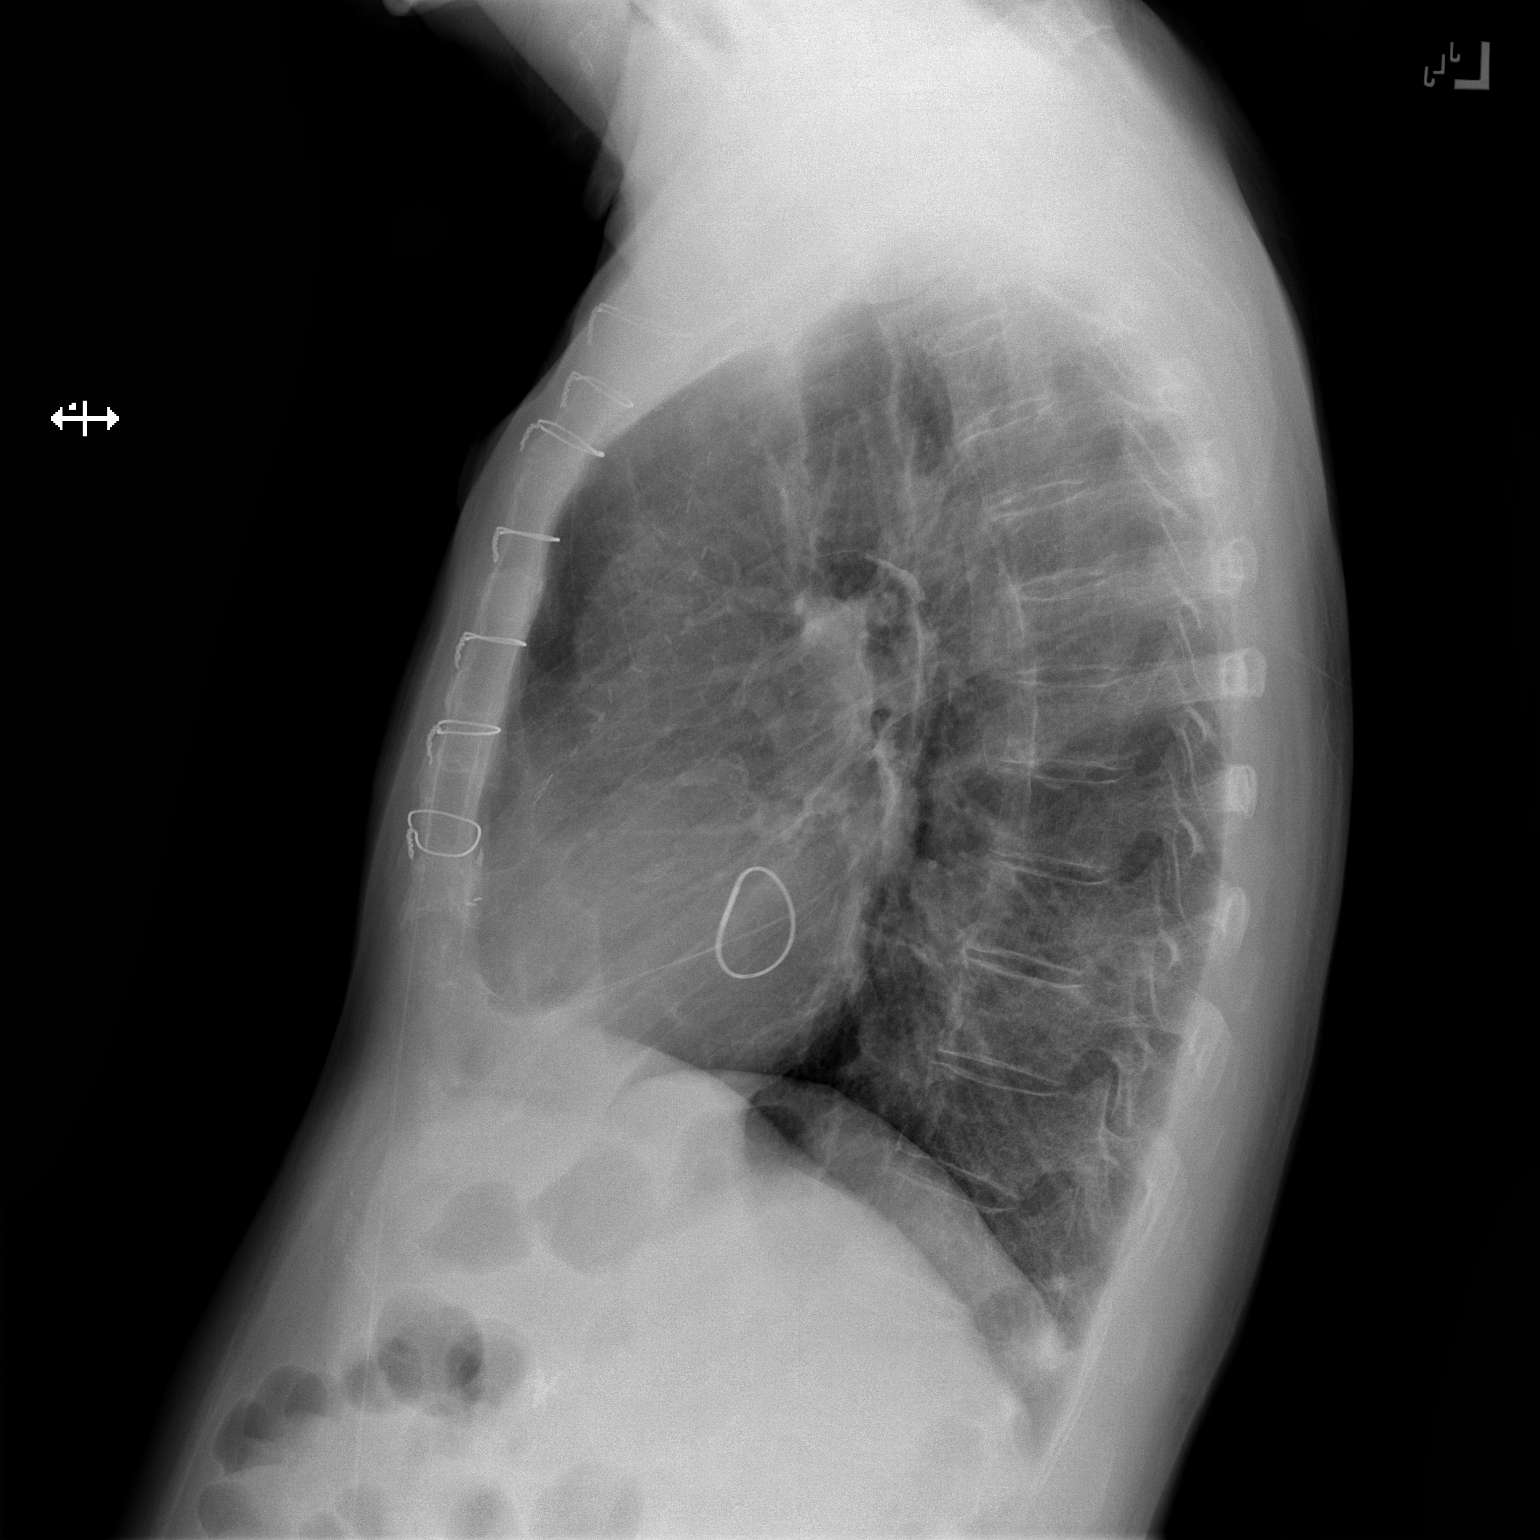

[2 of 2 positions shown; findings below may reference images not displayed]

FINDINGS: The lungs are hyperinflated. Multiple sternal wires and vascular
clips are noted. Mild slightly patchy infiltrate is seen within the
right lung base. There is no evidence of a pleural effusion or
pneumothorax. The heart size and mediastinal contours are within
normal limits. An artificial cardiac valve is seen. The visualized
skeletal structures are unremarkable. Radiopaque surgical clips are
seen within the right upper quadrant.
IMPRESSION: 1. Evidence of prior median sternotomy/CABG.
2. Mild right basilar infiltrate.

## 2021-12-30 DIAGNOSIS — N390 Urinary tract infection, site not specified: Secondary | ICD-10-CM | POA: Diagnosis not present

## 2021-12-30 DIAGNOSIS — I779 Disorder of arteries and arterioles, unspecified: Secondary | ICD-10-CM | POA: Diagnosis not present

## 2021-12-30 DIAGNOSIS — I700 Atherosclerosis of aorta: Secondary | ICD-10-CM | POA: Diagnosis not present

## 2021-12-30 DIAGNOSIS — Z299 Encounter for prophylactic measures, unspecified: Secondary | ICD-10-CM | POA: Diagnosis not present

## 2021-12-30 DIAGNOSIS — I10 Essential (primary) hypertension: Secondary | ICD-10-CM | POA: Diagnosis not present

## 2022-01-11 DIAGNOSIS — Z7189 Other specified counseling: Secondary | ICD-10-CM | POA: Diagnosis not present

## 2022-01-11 DIAGNOSIS — Z79899 Other long term (current) drug therapy: Secondary | ICD-10-CM | POA: Diagnosis not present

## 2022-01-11 DIAGNOSIS — I10 Essential (primary) hypertension: Secondary | ICD-10-CM | POA: Diagnosis not present

## 2022-01-11 DIAGNOSIS — Z6822 Body mass index (BMI) 22.0-22.9, adult: Secondary | ICD-10-CM | POA: Diagnosis not present

## 2022-01-11 DIAGNOSIS — Z299 Encounter for prophylactic measures, unspecified: Secondary | ICD-10-CM | POA: Diagnosis not present

## 2022-01-11 DIAGNOSIS — Z1331 Encounter for screening for depression: Secondary | ICD-10-CM | POA: Diagnosis not present

## 2022-01-11 DIAGNOSIS — Z1339 Encounter for screening examination for other mental health and behavioral disorders: Secondary | ICD-10-CM | POA: Diagnosis not present

## 2022-01-11 DIAGNOSIS — E039 Hypothyroidism, unspecified: Secondary | ICD-10-CM | POA: Diagnosis not present

## 2022-01-11 DIAGNOSIS — Z0000 Encounter for general adult medical examination without abnormal findings: Secondary | ICD-10-CM | POA: Diagnosis not present

## 2022-01-11 DIAGNOSIS — E7800 Pure hypercholesterolemia, unspecified: Secondary | ICD-10-CM | POA: Diagnosis not present

## 2022-01-11 DIAGNOSIS — R5383 Other fatigue: Secondary | ICD-10-CM | POA: Diagnosis not present

## 2022-01-13 DIAGNOSIS — R3912 Poor urinary stream: Secondary | ICD-10-CM | POA: Diagnosis not present

## 2022-01-13 DIAGNOSIS — N401 Enlarged prostate with lower urinary tract symptoms: Secondary | ICD-10-CM | POA: Diagnosis not present

## 2022-01-13 DIAGNOSIS — R3915 Urgency of urination: Secondary | ICD-10-CM | POA: Diagnosis not present

## 2022-01-13 DIAGNOSIS — R351 Nocturia: Secondary | ICD-10-CM | POA: Diagnosis not present

## 2022-02-14 ENCOUNTER — Other Ambulatory Visit: Payer: Self-pay | Admitting: Cardiology

## 2022-02-14 DIAGNOSIS — I10 Essential (primary) hypertension: Secondary | ICD-10-CM

## 2022-02-17 DIAGNOSIS — Z299 Encounter for prophylactic measures, unspecified: Secondary | ICD-10-CM | POA: Diagnosis not present

## 2022-02-17 DIAGNOSIS — I10 Essential (primary) hypertension: Secondary | ICD-10-CM | POA: Diagnosis not present

## 2022-02-17 DIAGNOSIS — U071 COVID-19: Secondary | ICD-10-CM | POA: Diagnosis not present

## 2022-02-19 ENCOUNTER — Other Ambulatory Visit: Payer: Self-pay | Admitting: Cardiology

## 2022-02-19 DIAGNOSIS — I10 Essential (primary) hypertension: Secondary | ICD-10-CM

## 2022-02-20 ENCOUNTER — Other Ambulatory Visit (INDEPENDENT_AMBULATORY_CARE_PROVIDER_SITE_OTHER): Payer: Self-pay | Admitting: Internal Medicine

## 2022-02-22 NOTE — Telephone Encounter (Signed)
Not seen in over one year. Needs office visit. Please schedule and I will ask doctor if he can have enough refills til his appt.

## 2022-02-23 NOTE — Telephone Encounter (Signed)
Has appt now for 10/30. Can he have enough til his appt?

## 2022-03-03 DIAGNOSIS — H0102A Squamous blepharitis right eye, upper and lower eyelids: Secondary | ICD-10-CM | POA: Diagnosis not present

## 2022-03-03 DIAGNOSIS — H43811 Vitreous degeneration, right eye: Secondary | ICD-10-CM | POA: Diagnosis not present

## 2022-03-03 DIAGNOSIS — H532 Diplopia: Secondary | ICD-10-CM | POA: Diagnosis not present

## 2022-03-03 DIAGNOSIS — Z961 Presence of intraocular lens: Secondary | ICD-10-CM | POA: Diagnosis not present

## 2022-03-03 DIAGNOSIS — H401131 Primary open-angle glaucoma, bilateral, mild stage: Secondary | ICD-10-CM | POA: Diagnosis not present

## 2022-03-03 DIAGNOSIS — H0102B Squamous blepharitis left eye, upper and lower eyelids: Secondary | ICD-10-CM | POA: Diagnosis not present

## 2022-03-03 DIAGNOSIS — H26491 Other secondary cataract, right eye: Secondary | ICD-10-CM | POA: Diagnosis not present

## 2022-03-03 DIAGNOSIS — H04123 Dry eye syndrome of bilateral lacrimal glands: Secondary | ICD-10-CM | POA: Diagnosis not present

## 2022-03-19 DIAGNOSIS — I700 Atherosclerosis of aorta: Secondary | ICD-10-CM | POA: Diagnosis not present

## 2022-03-19 DIAGNOSIS — K146 Glossodynia: Secondary | ICD-10-CM | POA: Diagnosis not present

## 2022-03-19 DIAGNOSIS — R42 Dizziness and giddiness: Secondary | ICD-10-CM | POA: Diagnosis not present

## 2022-03-19 DIAGNOSIS — Z23 Encounter for immunization: Secondary | ICD-10-CM | POA: Diagnosis not present

## 2022-03-19 DIAGNOSIS — Z299 Encounter for prophylactic measures, unspecified: Secondary | ICD-10-CM | POA: Diagnosis not present

## 2022-03-20 ENCOUNTER — Other Ambulatory Visit: Payer: Self-pay | Admitting: Cardiology

## 2022-03-20 DIAGNOSIS — I10 Essential (primary) hypertension: Secondary | ICD-10-CM

## 2022-03-29 DIAGNOSIS — Z23 Encounter for immunization: Secondary | ICD-10-CM | POA: Diagnosis not present

## 2022-04-02 ENCOUNTER — Other Ambulatory Visit: Payer: Self-pay | Admitting: Cardiology

## 2022-04-02 ENCOUNTER — Other Ambulatory Visit (INDEPENDENT_AMBULATORY_CARE_PROVIDER_SITE_OTHER): Payer: Self-pay | Admitting: Gastroenterology

## 2022-04-02 DIAGNOSIS — I10 Essential (primary) hypertension: Secondary | ICD-10-CM

## 2022-04-07 ENCOUNTER — Ambulatory Visit: Payer: Medicare Other | Admitting: Cardiology

## 2022-04-07 ENCOUNTER — Other Ambulatory Visit: Payer: Self-pay

## 2022-04-07 ENCOUNTER — Encounter: Payer: Self-pay | Admitting: Cardiology

## 2022-04-07 VITALS — BP 114/45 | HR 57 | Temp 97.1°F | Resp 16 | Ht 66.0 in | Wt 136.0 lb

## 2022-04-07 DIAGNOSIS — E7800 Pure hypercholesterolemia, unspecified: Secondary | ICD-10-CM | POA: Diagnosis not present

## 2022-04-07 DIAGNOSIS — I453 Trifascicular block: Secondary | ICD-10-CM | POA: Diagnosis not present

## 2022-04-07 DIAGNOSIS — I2510 Atherosclerotic heart disease of native coronary artery without angina pectoris: Secondary | ICD-10-CM | POA: Diagnosis not present

## 2022-04-07 DIAGNOSIS — Z9889 Other specified postprocedural states: Secondary | ICD-10-CM

## 2022-04-07 DIAGNOSIS — R42 Dizziness and giddiness: Secondary | ICD-10-CM

## 2022-04-07 DIAGNOSIS — I10 Essential (primary) hypertension: Secondary | ICD-10-CM

## 2022-04-07 MED ORDER — BIMATOPROST 0.01 % OP SOLN: 1.0000 [drp] | Freq: Every day | OPHTHALMIC | 2 refills | Status: DC

## 2022-04-07 MED ORDER — CYCLOSPORINE 0.05 % OP EMUL: 1.0000 [drp] | Freq: Every day | OPHTHALMIC | 3 refills | Status: DC

## 2022-04-07 MED ORDER — MIRABEGRON ER 25 MG PO TB24: 25.0000 mg | ORAL_TABLET | ORAL | 1 refills | Status: DC

## 2022-04-07 NOTE — Progress Notes (Signed)
Primary Physician/Referring:  Glenda Chroman, MD  Patient ID: [name], male    DOB: June 04, 1935, 87 y.o.   MRN: [id number]  Chief Complaint  Patient presents with   Coronary Artery Disease   Hypertension   Hyperlipidemia   Follow-up    1 year    Dizziness   HPI:    HPI: [name]  is a 87 y.o. with history of CAD with 1 vessel CABG with LIMA to LAD and mitral valve repair done in [place], in May 2010, history of stroke due to symptomatic right carotid artery stenosis in September 2010 S/P right carotid endarterectomy in November 2010, hypertension, hyperlipidemia, trifascicular block.   He is here on annual visit and follow-up of coronary disease, mitral valve repair.  Remains asymptomatic except he has noticed occasional episodes of dizziness especially when he stands up suddenly.  Past Medical History:  Diagnosis Date   CVA (cerebral infarction)    Significant carotid artery disease status post right carotid endarterectomy, postoperatively   Depression    GERD (gastroesophageal reflux disease)    Heart murmur    Hemorrhoids    Hernia    History of BPH    Hyperlipemia    Hypertension    Hypothyroidism    Mitral regurgitation     status post mitral valve repair at [facility] in [facility]   Pneumonia    Sleep apnea    Status post coronary artery bypass grafting    Single-vessel coronary bypass grafting at time of mitral valve repair   Stroke [facility]) Oct 2010   Thyroid disease    hypothyroidism   Past Surgical History:  Procedure Laterality Date   BIOPSY  02/27/2020   Procedure: BIOPSY;  Surgeon: Rogene Houston, MD;  [facility];  Service: Endoscopy;;   CAROTID ENDARTERECTOMY Right Nov. 2010   CEA   CATARACT EXTRACTION     CHOLECYSTECTOMY     COLONOSCOPY N/A 05/30/2013   Procedure: COLONOSCOPY;  Surgeon: Rogene Houston, MD;  [facility];  Service: Endoscopy;  Laterality: N/A;  1230-rescheduled to 12:00 Ann  notified pt   CORONARY ARTERY BYPASS GRAFT  2010   ERCP     with biliary and pancreatic stenting   ESOPHAGOGASTRODUODENOSCOPY (EGD) WITH PROPOFOL N/A 02/27/2020   Procedure: ESOPHAGOGASTRODUODENOSCOPY (EGD) WITH PROPOFOL;  Surgeon: Rogene Houston, MD;  [facility];  Service: Endoscopy;  Laterality: N/A;  [place]  01/19/2011   Bilateral   MITRAL VALVE REPAIR  April 2010   MITRAL VALVE REPAIR  2010   nevus removed     TURP VAPORIZATION     WART FULGURATION  01/03/2012   Procedure: FULGURATION ANAL WART;  Surgeon: Zenovia Jarred, MD;  Location: [place];  Service: General;  Laterality: N/A;  excision perianal condylomata   Social History   Tobacco Use   Smoking status: Never   Smokeless tobacco: Never  Substance Use Topics   Alcohol use: No   Marital Status: Married   ROS  Review of Systems  Cardiovascular:  Negative for chest pain, dyspnea on exertion and leg swelling.  Musculoskeletal:  Positive for arthritis.  Gastrointestinal:  Negative for heartburn.  Neurological:  Positive for dizziness.   Objective  Blood pressure (!) 114/45, pulse (!) 57, temperature (!) 97.1 F (36.2 C), temperature source Temporal, resp. rate 16, height '5\' 6"'$  (1.676 m), weight 136 lb (61.7 kg), SpO2 98 %.  Body mass index is 21.95 kg/m.      04/07/2022    3:05 PM 04/07/2021    2:55 PM 01/06/2021   12:12 PM  Vitals with BMI  Height '5\' 6"'$  '5\' 6"'$  '5\' 6"'$   Weight 136 lbs 135 lbs 135 lbs 8 oz  BMI 21.96 21.3 08.65  Systolic 784 696 295  Diastolic 45 54 68  Pulse 57 65 81     Orthostatic VS for the past 72 hrs (Last 3 readings):  Orthostatic BP Patient Position BP Location Cuff Size Orthostatic Pulse  04/07/22 1516 122/44 Standing Left Arm Normal 56  04/07/22 1515 131/44 Sitting Left Arm Normal 57  04/07/22 1514 134/51 Supine Left Arm Normal 58    Physical Exam Vitals reviewed.  Constitutional:      Appearance: He is well-developed.  Neck:      Vascular: Carotid bruit (Bilateral. Right carotid endarterectomy scar noted.) and JVD present.  Cardiovascular:     Rate and Rhythm: Normal rate and regular rhythm.     Pulses: Normal pulses and intact distal pulses.     Heart sounds: Murmur heard.     Blowing midsystolic murmur is present at the upper right sternal border radiating to the apex.  Pulmonary:     Effort: Pulmonary effort is normal. No accessory muscle usage or respiratory distress.     Breath sounds: Normal breath sounds.  Abdominal:     General: Bowel sounds are normal. There is abdominal bruit.     Palpations: Abdomen is soft. There is no pulsatile mass.  Musculoskeletal:     Right lower leg: No edema.     Left lower leg: No edema.    Radiology: No results found.  Laboratory examination:   Lab Results  Component Value Date   CHOL 113 06/16/2021   HDL 34 (L) 06/16/2021   LDLCALC 59 06/16/2021   TRIG 109 06/16/2021    External labs:  Cholesterol, total 117.000 M 01/08/2021 HDL 35.000 MG 01/08/2021 LDL 83.000 MG 12/25/2018 Triglycerides 89.000 MG 01/08/2021  Hemoglobin 12.000 G/ 01/08/2021 Platelets 234.000 X1 01/08/2021  Creatinine, Serum 1.480 MG/ 01/08/2021 ALT (SGPT) 8.000 IU/L 01/08/2021  TSH 1.360 01/08/2021 Medications   Current Outpatient Medications:    amLODipine (NORVASC) 10 MG tablet, TAKE ONE TABLET BY MOUTH ONE TIME DAILY, Disp: 180 tablet, Rfl: 3   aspirin (ASPIRIN CHILDRENS) 81 MG chewable tablet, Chew 1 tablet (81 mg total) by mouth daily., Disp: , Rfl:    carvedilol (COREG) 12.5 MG tablet, Take 1 tablet by mouth twice daily, Disp: 180 tablet, Rfl: 0   levothyroxine (SYNTHROID) 50 MCG tablet, Take 50 mcg by mouth every other day., Disp: , Rfl:    levothyroxine (SYNTHROID) 75 MCG tablet, Take 75 mcg by mouth every other day. Alternates every other day with '50mg'$ , Disp: , Rfl:    losartan-hydrochlorothiazide (HYZAAR) 50-12.5 MG tablet, Take 1 tablet by mouth daily., Disp: 30 tablet, Rfl: 6    pantoprazole (PROTONIX) 20 MG tablet, TAKE ONE TABLET BY MOUTH DAILY IN THE MORNING BEFORE BREAKFAST, Disp: 30 tablet, Rfl: 0   rosuvastatin (CRESTOR) 20 MG tablet, Take 1 tablet (20 mg total) by mouth daily., Disp: 90 tablet, Rfl: 3   silodosin (RAPAFLO) 8 MG CAPS capsule, Take 8 mg by mouth every other day. Patient alternates with Myrbetriq., Disp: , Rfl:    vitamin B-12 (CYANOCOBALAMIN) 500 MCG tablet, Take 500 mcg by mouth daily., Disp: , Rfl:    bimatoprost (LUMIGAN) 0.01 % SOLN, Place 1 drop into  both eyes at bedtime., Disp: 3 mL, Rfl: 2   cycloSPORINE (RESTASIS) 0.05 % ophthalmic emulsion, Place 1 drop into both eyes at bedtime., Disp: 1.2 mL, Rfl: 3   mirabegron ER (MYRBETRIQ) 25 MG TB24 tablet, Take 1 tablet (25 mg total) by mouth every other day., Disp: 180 tablet, Rfl: 1    Cardiac Studies:   Exercise myoview stress 12/29/2015: 1. The resting electrocardiogram demonstrated normal sinus rhythm, RBBB and no resting arrhythmias. Cannot exclude inferior infarct old. The stress electrocardiogram was normal. Patient exercised on Bruce protocol for 5.0 minutes and achieved 7.03 METS. Stress test terminated due to dyspnea and target heart rate( 99% MPHR). 2. Myocardial perfusion imaging is normal. Overall left ventricular systolic function was normal without regional wall motion abnormalities. The left ventricular ejection fraction was 55%  Carotid artery duplex 09/16/2020: Minimal stenosis in the bilateral internal carotid artery (1-15%) with homogenous plaque.  Bilateral external carotid artery are consistent with stenosis in the range of <50% Antegrade right vertebral artery flow. Antegrade left vertebral artery flow. Right carotid endarterectomy site is patent. No significant change from 01/04/2017. Follow up studies if clinically indicated.   Echocardiogram 10/29/2019: Left ventricle cavity is normal in size. Moderate concentric hypertrophy of the left ventricle. Normal LV systolic  function with EF 55%. Normal global wall motion. Diastolic function not assessed to post-op status. Calculated EF 55%. Left atrial cavity is mildly dilated. Trileaflet aortic valve.  Moderate (Grade II) aortic regurgitation. S/p mitral valve repair. Mean PG 4 mmHg, MVA 1.4 cm2 by PHT method, suggesting mild mitral stenosis. Mild (Grade I) mitral regurgitation. Compared to previous study in 2017, mitral valve mean PG increased from 2 mmHg to 6 mmHg.   EKG:   EKG 04/07/2022: Sinus rhythm with first-degree block at 67 bpm, left atrial enlargement, left axis deviation, left anterior fascicular block.  Right bundle branch block.  Trifascicular block.  Frequent PVCs in trigeminal pattern.  Compared to 04/07/2021, ventricular bigeminy new.  First-degree AV block new.  Assessment     ICD-10-CM   1. Coronary artery disease involving native coronary artery of native heart without angina pectoris  I25.10     2. Essential hypertension  I10     3. History of mitral valve repair  MV repair with implantation of 28 mm Edwards annuloplasty ring at [facility], [place], [place] 10/08/2008  Z98.890     4. Dizziness  R42 EKG 12-Lead    5. Hypercholesteremia  E78.00     6. Trifascicular block  I45.3      Meds ordered this encounter  Medications   DISCONTD: mirabegron ER (MYRBETRIQ) 25 MG TB24 tablet    Sig: Take 1 tablet (25 mg total) by mouth every other day.    Dispense:  180 tablet    Refill:  1   DISCONTD: cycloSPORINE (RESTASIS) 0.05 % ophthalmic emulsion    Sig: Place 1 drop into both eyes at bedtime.    Dispense:  1.2 mL    Refill:  3   DISCONTD: bimatoprost (LUMIGAN) 0.01 % SOLN    Sig: Place 1 drop into both eyes at bedtime.    Dispense:  3 mL    Refill:  2   Medications Discontinued During This Encounter  Medication Reason   bimatoprost (LUMIGAN) 0.01 % SOLN Reorder   mirabegron ER (MYRBETRIQ) 25 MG TB24 tablet Reorder   cycloSPORINE (RESTASIS) 0.05 % ophthalmic emulsion Reorder    hydrALAZINE (APRESOLINE) 25 MG tablet Discontinued by provider  Recommendations:   [name]  is a 87 y.o. with history of CAD with 1 vessel CABG with LIMA to LAD and mitral valve repair done in [place], in May 2010, history of stroke due to symptomatic right carotid artery stenosis in September 2010 S/P right carotid endarterectomy in November 2010, hypertension, hyperlipidemia, trifascicular block.   He is here on annual visit and follow-up of coronary disease, mitral valve repair.  Remains asymptomatic except he has noticed occasional episodes of dizziness especially when he stands up suddenly.  His blood pressure is very low and suspect the etiology for this.  We will discontinue hydralazine.  From coronary artery disease standpoint he has not had any angina pectoris and no clinical evidence of heart failure.  He is presently 87 years of age.  Lipids are well controlled..  No change in his physical exam.  He does have prominent carotid artery bruit but recent examination does not reveal any restenosis in his carotid endarterectomy site.  Otherwise he remains stable from cardiac standpoint without recurrence of angina pectoris no change in his EKG, I will see him back in 1 year for follow-up.  He is presently on ophthalmologic drops, he is planning to travel to [place] and requested me to write a prescription for the same as it is cheaper and I obliged.   Adrian Prows, MD, [facility] 04/07/2022, 9:13 PM Office: [phone] Pager: [phone]

## 2022-04-08 DIAGNOSIS — Z299 Encounter for prophylactic measures, unspecified: Secondary | ICD-10-CM | POA: Diagnosis not present

## 2022-04-08 DIAGNOSIS — M1711 Unilateral primary osteoarthritis, right knee: Secondary | ICD-10-CM | POA: Diagnosis not present

## 2022-04-08 DIAGNOSIS — I10 Essential (primary) hypertension: Secondary | ICD-10-CM | POA: Diagnosis not present

## 2022-04-08 DIAGNOSIS — M25561 Pain in right knee: Secondary | ICD-10-CM | POA: Diagnosis not present

## 2022-04-08 DIAGNOSIS — I959 Hypotension, unspecified: Secondary | ICD-10-CM | POA: Diagnosis not present

## 2022-04-12 ENCOUNTER — Ambulatory Visit (INDEPENDENT_AMBULATORY_CARE_PROVIDER_SITE_OTHER): Payer: Medicare Other | Admitting: Gastroenterology

## 2022-06-08 DIAGNOSIS — R109 Unspecified abdominal pain: Secondary | ICD-10-CM | POA: Diagnosis not present

## 2022-06-08 DIAGNOSIS — I10 Essential (primary) hypertension: Secondary | ICD-10-CM | POA: Diagnosis not present

## 2022-06-08 DIAGNOSIS — J069 Acute upper respiratory infection, unspecified: Secondary | ICD-10-CM | POA: Diagnosis not present

## 2022-06-10 DIAGNOSIS — N390 Urinary tract infection, site not specified: Secondary | ICD-10-CM | POA: Diagnosis not present

## 2022-06-10 DIAGNOSIS — I10 Essential (primary) hypertension: Secondary | ICD-10-CM | POA: Diagnosis not present

## 2022-06-11 ENCOUNTER — Other Ambulatory Visit: Payer: Self-pay | Admitting: Cardiology

## 2022-06-11 DIAGNOSIS — E7800 Pure hypercholesterolemia, unspecified: Secondary | ICD-10-CM

## 2022-07-09 DIAGNOSIS — F339 Major depressive disorder, recurrent, unspecified: Secondary | ICD-10-CM | POA: Diagnosis not present

## 2022-07-09 DIAGNOSIS — Z299 Encounter for prophylactic measures, unspecified: Secondary | ICD-10-CM | POA: Diagnosis not present

## 2022-07-09 DIAGNOSIS — I10 Essential (primary) hypertension: Secondary | ICD-10-CM | POA: Diagnosis not present

## 2022-07-09 DIAGNOSIS — I700 Atherosclerosis of aorta: Secondary | ICD-10-CM | POA: Diagnosis not present

## 2022-07-09 DIAGNOSIS — N1832 Chronic kidney disease, stage 3b: Secondary | ICD-10-CM | POA: Diagnosis not present

## 2022-08-06 DIAGNOSIS — Z87440 Personal history of urinary (tract) infections: Secondary | ICD-10-CM | POA: Diagnosis not present

## 2022-08-06 DIAGNOSIS — N401 Enlarged prostate with lower urinary tract symptoms: Secondary | ICD-10-CM | POA: Diagnosis not present

## 2022-08-06 DIAGNOSIS — R351 Nocturia: Secondary | ICD-10-CM | POA: Diagnosis not present

## 2022-08-25 DIAGNOSIS — R490 Dysphonia: Secondary | ICD-10-CM | POA: Diagnosis not present

## 2022-08-25 DIAGNOSIS — K219 Gastro-esophageal reflux disease without esophagitis: Secondary | ICD-10-CM | POA: Diagnosis not present

## 2022-08-27 DIAGNOSIS — R21 Rash and other nonspecific skin eruption: Secondary | ICD-10-CM | POA: Diagnosis not present

## 2022-08-27 DIAGNOSIS — L821 Other seborrheic keratosis: Secondary | ICD-10-CM | POA: Diagnosis not present

## 2022-08-27 DIAGNOSIS — Z299 Encounter for prophylactic measures, unspecified: Secondary | ICD-10-CM | POA: Diagnosis not present

## 2022-08-27 DIAGNOSIS — I10 Essential (primary) hypertension: Secondary | ICD-10-CM | POA: Diagnosis not present

## 2022-08-27 DIAGNOSIS — R490 Dysphonia: Secondary | ICD-10-CM | POA: Diagnosis not present

## 2022-10-05 DIAGNOSIS — H401131 Primary open-angle glaucoma, bilateral, mild stage: Secondary | ICD-10-CM | POA: Diagnosis not present

## 2022-10-05 DIAGNOSIS — H0102A Squamous blepharitis right eye, upper and lower eyelids: Secondary | ICD-10-CM | POA: Diagnosis not present

## 2022-10-05 DIAGNOSIS — H26491 Other secondary cataract, right eye: Secondary | ICD-10-CM | POA: Diagnosis not present

## 2022-10-05 DIAGNOSIS — H43811 Vitreous degeneration, right eye: Secondary | ICD-10-CM | POA: Diagnosis not present

## 2022-10-05 DIAGNOSIS — H0102B Squamous blepharitis left eye, upper and lower eyelids: Secondary | ICD-10-CM | POA: Diagnosis not present

## 2022-10-05 DIAGNOSIS — Z961 Presence of intraocular lens: Secondary | ICD-10-CM | POA: Diagnosis not present

## 2022-10-05 DIAGNOSIS — H04123 Dry eye syndrome of bilateral lacrimal glands: Secondary | ICD-10-CM | POA: Diagnosis not present

## 2022-10-06 DIAGNOSIS — R490 Dysphonia: Secondary | ICD-10-CM | POA: Diagnosis not present

## 2022-10-06 DIAGNOSIS — L299 Pruritus, unspecified: Secondary | ICD-10-CM | POA: Diagnosis not present

## 2022-10-06 DIAGNOSIS — K219 Gastro-esophageal reflux disease without esophagitis: Secondary | ICD-10-CM | POA: Diagnosis not present

## 2022-10-08 DIAGNOSIS — I10 Essential (primary) hypertension: Secondary | ICD-10-CM | POA: Diagnosis not present

## 2022-10-08 DIAGNOSIS — R682 Dry mouth, unspecified: Secondary | ICD-10-CM | POA: Diagnosis not present

## 2022-10-08 DIAGNOSIS — Z6821 Body mass index (BMI) 21.0-21.9, adult: Secondary | ICD-10-CM | POA: Diagnosis not present

## 2022-10-08 DIAGNOSIS — Z299 Encounter for prophylactic measures, unspecified: Secondary | ICD-10-CM | POA: Diagnosis not present

## 2022-10-13 ENCOUNTER — Other Ambulatory Visit: Payer: Self-pay | Admitting: Cardiology

## 2022-10-13 DIAGNOSIS — I10 Essential (primary) hypertension: Secondary | ICD-10-CM

## 2023-01-17 DIAGNOSIS — R5383 Other fatigue: Secondary | ICD-10-CM | POA: Diagnosis not present

## 2023-01-17 DIAGNOSIS — J069 Acute upper respiratory infection, unspecified: Secondary | ICD-10-CM | POA: Diagnosis not present

## 2023-01-19 ENCOUNTER — Other Ambulatory Visit: Payer: Self-pay | Admitting: Cardiology

## 2023-01-19 ENCOUNTER — Telehealth: Payer: Self-pay | Admitting: Cardiology

## 2023-01-19 ENCOUNTER — Other Ambulatory Visit: Payer: Self-pay

## 2023-01-19 DIAGNOSIS — I10 Essential (primary) hypertension: Secondary | ICD-10-CM

## 2023-01-19 MED ORDER — CARVEDILOL 12.5 MG PO TABS
12.5000 mg | ORAL_TABLET | Freq: Two times a day (BID) | ORAL | 0 refills | Status: DC
Start: 2023-01-19 — End: 2023-08-11

## 2023-01-19 NOTE — Telephone Encounter (Signed)
Pt called in requesting a refill for carvedilol sent to [facility]

## 2023-01-20 NOTE — Telephone Encounter (Signed)
Done

## 2023-02-01 DIAGNOSIS — H401131 Primary open-angle glaucoma, bilateral, mild stage: Secondary | ICD-10-CM | POA: Diagnosis not present

## 2023-02-01 DIAGNOSIS — H26491 Other secondary cataract, right eye: Secondary | ICD-10-CM | POA: Diagnosis not present

## 2023-02-01 DIAGNOSIS — H0102A Squamous blepharitis right eye, upper and lower eyelids: Secondary | ICD-10-CM | POA: Diagnosis not present

## 2023-02-01 DIAGNOSIS — H0102B Squamous blepharitis left eye, upper and lower eyelids: Secondary | ICD-10-CM | POA: Diagnosis not present

## 2023-02-01 DIAGNOSIS — Z961 Presence of intraocular lens: Secondary | ICD-10-CM | POA: Diagnosis not present

## 2023-02-01 DIAGNOSIS — H04123 Dry eye syndrome of bilateral lacrimal glands: Secondary | ICD-10-CM | POA: Diagnosis not present

## 2023-02-01 DIAGNOSIS — H43811 Vitreous degeneration, right eye: Secondary | ICD-10-CM | POA: Diagnosis not present

## 2023-02-03 DIAGNOSIS — E7800 Pure hypercholesterolemia, unspecified: Secondary | ICD-10-CM | POA: Diagnosis not present

## 2023-02-03 DIAGNOSIS — E039 Hypothyroidism, unspecified: Secondary | ICD-10-CM | POA: Diagnosis not present

## 2023-02-03 DIAGNOSIS — Z79899 Other long term (current) drug therapy: Secondary | ICD-10-CM | POA: Diagnosis not present

## 2023-02-03 DIAGNOSIS — Z1339 Encounter for screening examination for other mental health and behavioral disorders: Secondary | ICD-10-CM | POA: Diagnosis not present

## 2023-02-03 DIAGNOSIS — I10 Essential (primary) hypertension: Secondary | ICD-10-CM | POA: Diagnosis not present

## 2023-02-03 DIAGNOSIS — Z1331 Encounter for screening for depression: Secondary | ICD-10-CM | POA: Diagnosis not present

## 2023-02-03 DIAGNOSIS — Z7189 Other specified counseling: Secondary | ICD-10-CM | POA: Diagnosis not present

## 2023-02-03 DIAGNOSIS — Z299 Encounter for prophylactic measures, unspecified: Secondary | ICD-10-CM | POA: Diagnosis not present

## 2023-02-03 DIAGNOSIS — R5383 Other fatigue: Secondary | ICD-10-CM | POA: Diagnosis not present

## 2023-02-03 DIAGNOSIS — Z0000 Encounter for general adult medical examination without abnormal findings: Secondary | ICD-10-CM | POA: Diagnosis not present

## 2023-02-03 DIAGNOSIS — Z125 Encounter for screening for malignant neoplasm of prostate: Secondary | ICD-10-CM | POA: Diagnosis not present

## 2023-02-04 DIAGNOSIS — R3915 Urgency of urination: Secondary | ICD-10-CM | POA: Diagnosis not present

## 2023-02-04 DIAGNOSIS — N401 Enlarged prostate with lower urinary tract symptoms: Secondary | ICD-10-CM | POA: Diagnosis not present

## 2023-04-06 DIAGNOSIS — K219 Gastro-esophageal reflux disease without esophagitis: Secondary | ICD-10-CM | POA: Diagnosis not present

## 2023-04-06 DIAGNOSIS — L299 Pruritus, unspecified: Secondary | ICD-10-CM | POA: Diagnosis not present

## 2023-04-06 DIAGNOSIS — H903 Sensorineural hearing loss, bilateral: Secondary | ICD-10-CM | POA: Diagnosis not present

## 2023-04-06 DIAGNOSIS — R490 Dysphonia: Secondary | ICD-10-CM | POA: Diagnosis not present

## 2023-04-11 ENCOUNTER — Ambulatory Visit: Payer: Self-pay | Admitting: Cardiology

## 2023-04-15 DIAGNOSIS — I10 Essential (primary) hypertension: Secondary | ICD-10-CM | POA: Diagnosis not present

## 2023-04-15 DIAGNOSIS — R001 Bradycardia, unspecified: Secondary | ICD-10-CM | POA: Diagnosis not present

## 2023-05-15 DEATH — deceased

## 2023-08-15 ENCOUNTER — Ambulatory Visit: Payer: Self-pay | Admitting: Cardiology
